# Patient Record
Sex: Male | Born: 1960 | Race: White | Hispanic: No | Marital: Married | Smoking: Never smoker
Health system: Southern US, Community
[De-identification: ages and names within clinical notes are randomized; demographics above are authoritative.]

## PROBLEM LIST (undated history)

## (undated) VITALS — BP 148/95 | HR 102 | Temp 97.8°F | Resp 20 | Ht 69.5 in | Wt 175.0 lb

## (undated) DIAGNOSIS — K859 Acute pancreatitis without necrosis or infection, unspecified: Secondary | ICD-10-CM

## (undated) DIAGNOSIS — K8020 Calculus of gallbladder without cholecystitis without obstruction: Secondary | ICD-10-CM

## (undated) DIAGNOSIS — D649 Anemia, unspecified: Secondary | ICD-10-CM

## (undated) DIAGNOSIS — I10 Essential (primary) hypertension: Secondary | ICD-10-CM

## (undated) DIAGNOSIS — F3181 Bipolar II disorder: Secondary | ICD-10-CM

## (undated) DIAGNOSIS — E785 Hyperlipidemia, unspecified: Secondary | ICD-10-CM

## (undated) DIAGNOSIS — Z860101 Personal history of adenomatous and serrated colon polyps: Secondary | ICD-10-CM

## (undated) DIAGNOSIS — F32A Depression, unspecified: Secondary | ICD-10-CM

## (undated) DIAGNOSIS — F329 Major depressive disorder, single episode, unspecified: Secondary | ICD-10-CM

## (undated) DIAGNOSIS — Z86010 Personal history of colonic polyps: Secondary | ICD-10-CM

## (undated) DIAGNOSIS — R569 Unspecified convulsions: Secondary | ICD-10-CM

## (undated) DIAGNOSIS — K219 Gastro-esophageal reflux disease without esophagitis: Secondary | ICD-10-CM

## (undated) DIAGNOSIS — J45909 Unspecified asthma, uncomplicated: Secondary | ICD-10-CM

## (undated) DIAGNOSIS — T7840XA Allergy, unspecified, initial encounter: Secondary | ICD-10-CM

## (undated) HISTORY — DX: Depression, unspecified: F32.A

## (undated) HISTORY — PX: COLONOSCOPY: SHX174

## (undated) HISTORY — PX: POLYPECTOMY: SHX149

## (undated) HISTORY — DX: Unspecified convulsions: R56.9

## (undated) HISTORY — DX: Hyperlipidemia, unspecified: E78.5

## (undated) HISTORY — DX: Unspecified asthma, uncomplicated: J45.909

## (undated) HISTORY — DX: Bipolar II disorder: F31.81

## (undated) HISTORY — DX: Acute pancreatitis without necrosis or infection, unspecified: K85.90

## (undated) HISTORY — DX: Gastro-esophageal reflux disease without esophagitis: K21.9

## (undated) HISTORY — DX: Allergy, unspecified, initial encounter: T78.40XA

## (undated) HISTORY — DX: Personal history of colonic polyps: Z86.010

## (undated) HISTORY — DX: Calculus of gallbladder without cholecystitis without obstruction: K80.20

## (undated) HISTORY — DX: Essential (primary) hypertension: I10

## (undated) HISTORY — DX: Anemia, unspecified: D64.9

## (undated) HISTORY — DX: Personal history of adenomatous and serrated colon polyps: Z86.0101

---
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
## 2019-07-15 ENCOUNTER — Ambulatory Visit (INDEPENDENT_AMBULATORY_CARE_PROVIDER_SITE_OTHER): Payer: Medicare Other | Admitting: Family Medicine

## 2019-07-15 ENCOUNTER — Encounter: Payer: Self-pay | Admitting: Family Medicine

## 2019-07-15 ENCOUNTER — Other Ambulatory Visit: Payer: Self-pay

## 2019-07-15 VITALS — BP 130/80 | HR 87 | Temp 97.6°F | Resp 16 | Ht 69.29 in | Wt 206.8 lb

## 2019-07-15 DIAGNOSIS — F3181 Bipolar II disorder: Secondary | ICD-10-CM

## 2019-07-15 DIAGNOSIS — N529 Male erectile dysfunction, unspecified: Secondary | ICD-10-CM

## 2019-07-15 DIAGNOSIS — E785 Hyperlipidemia, unspecified: Secondary | ICD-10-CM

## 2019-07-15 DIAGNOSIS — Z23 Encounter for immunization: Secondary | ICD-10-CM | POA: Diagnosis not present

## 2019-07-15 DIAGNOSIS — I10 Essential (primary) hypertension: Secondary | ICD-10-CM

## 2019-07-15 DIAGNOSIS — E291 Testicular hypofunction: Secondary | ICD-10-CM | POA: Diagnosis not present

## 2019-07-15 DIAGNOSIS — Z5181 Encounter for therapeutic drug level monitoring: Secondary | ICD-10-CM

## 2019-07-15 DIAGNOSIS — R7303 Prediabetes: Secondary | ICD-10-CM | POA: Diagnosis not present

## 2019-07-15 LAB — CBC WITH DIFFERENTIAL/PLATELET
Basophils Absolute: 0 10*3/uL (ref 0.0–0.1)
Basophils Relative: 0.6 % (ref 0.0–3.0)
Eosinophils Absolute: 0.1 10*3/uL (ref 0.0–0.7)
Eosinophils Relative: 1.3 % (ref 0.0–5.0)
HCT: 43.2 % (ref 39.0–52.0)
Hemoglobin: 14.8 g/dL (ref 13.0–17.0)
Lymphocytes Relative: 28 % (ref 12.0–46.0)
Lymphs Abs: 2.1 10*3/uL (ref 0.7–4.0)
MCHC: 34.3 g/dL (ref 30.0–36.0)
MCV: 89.4 fl (ref 78.0–100.0)
Monocytes Absolute: 0.5 10*3/uL (ref 0.1–1.0)
Monocytes Relative: 6.7 % (ref 3.0–12.0)
Neutro Abs: 4.6 10*3/uL (ref 1.4–7.7)
Neutrophils Relative %: 63.4 % (ref 43.0–77.0)
Platelets: 174 10*3/uL (ref 150.0–400.0)
RBC: 4.83 Mil/uL (ref 4.22–5.81)
RDW: 12.8 % (ref 11.5–15.5)
WBC: 7.3 10*3/uL (ref 4.0–10.5)

## 2019-07-15 LAB — COMPREHENSIVE METABOLIC PANEL
ALT: 25 U/L (ref 0–53)
AST: 17 U/L (ref 0–37)
Albumin: 4.7 g/dL (ref 3.5–5.2)
Alkaline Phosphatase: 36 U/L — ABNORMAL LOW (ref 39–117)
BUN: 18 mg/dL (ref 6–23)
CO2: 29 mEq/L (ref 19–32)
Calcium: 9.4 mg/dL (ref 8.4–10.5)
Chloride: 101 mEq/L (ref 96–112)
Creatinine, Ser: 0.96 mg/dL (ref 0.40–1.50)
GFR: 80.44 mL/min (ref >60.00)
Glucose, Bld: 90 mg/dL (ref 70–99)
Potassium: 4 mEq/L (ref 3.5–5.1)
Sodium: 140 mEq/L (ref 135–145)
Total Bilirubin: 0.7 mg/dL (ref 0.2–1.2)
Total Protein: 7.1 g/dL (ref 6.0–8.3)

## 2019-07-15 LAB — TESTOSTERONE: Testosterone: 495.04 ng/dL (ref 300.00–890.00)

## 2019-07-15 LAB — LIPID PANEL
Cholesterol: 266 mg/dL — ABNORMAL HIGH (ref 0–200)
HDL: 33.4 mg/dL — ABNORMAL LOW (ref >39.00)
NonHDL: 232.39
Total CHOL/HDL Ratio: 8
Triglycerides: 233 mg/dL — ABNORMAL HIGH (ref 0.0–149.0)
VLDL: 46.6 mg/dL — ABNORMAL HIGH (ref 0.0–40.0)

## 2019-07-15 LAB — LDL CHOLESTEROL, DIRECT: Direct LDL: 176 mg/dL

## 2019-07-15 LAB — HEMOGLOBIN A1C: Hgb A1c MFr Bld: 5.6 % (ref 4.6–6.5)

## 2019-07-15 MED ORDER — SILDENAFIL CITRATE 50 MG PO TABS: 50.0000 mg | ORAL_TABLET | Freq: Every day | ORAL | 3 refills | Status: DC | PRN

## 2019-07-15 NOTE — Progress Notes (Signed)
HPI:   [name] is a 58 y.o. male, who is here today to establish care.  Former PCP: Dr Chase Hunter Last preventive routine visit: 1-2 years.  Chronic medical problems: Asthma,low testosterone,bipolar disorder,allergic rhinitis,prediabetes, ED,HTN, and HLD among some. On Disability due to bipolar disorder. He follows with psychiatrist. He has a list of labs that his psychiatrist is requesting for him to have done.   HTN: Dx'ed "several years ago." He is on HCTZ 25 mg daily, Amlodipine 5 mg,Coreg 3.125 mg bid,and Lisinopril 40 mg daily. He does not check BP at home. Denies severe/frequent headache, visual changes, chest pain, dyspnea, palpitation, focal weakness, or edema.  Concerns today: Sildenafil refill. He has been on testosterone replacement for about 8 years. He is on Testosterone cypionate 100 mg weekly. Nocturia x 1 No side effects reported.  HLD: On non pharmacologic treatment.  [place] Component Name Value Ref Range  Triglycerides 252 (H) 1 - 149 mg/dL  Cholesterol 249 (H) 100 - 199 mg/dL  HDL 37 (L) 40 - 59 mg/dL  LDL Calculated 162 (H)  Comment: NHLBI Recommended Ranges, LDL Cholesterol, for Adults (20+yrs) (ATPIII), mg/dL Optimal       <100 60 - 99 mg/dL     Review of Systems  Constitutional: Negative for activity change, appetite change, fatigue, fever and unexpected weight change.  HENT: Negative for nosebleeds, sore throat and trouble swallowing.   Eyes: Negative for pain and redness.  Respiratory: Negative for cough and wheezing.   Gastrointestinal: Negative for abdominal pain, nausea and vomiting.  Genitourinary: Negative for decreased urine volume, dysuria and hematuria.  Musculoskeletal: Negative for gait problem and myalgias.  Skin: Negative for rash and wound.  Neurological: Negative for dizziness, syncope and numbness.  Psychiatric/Behavioral: Negative for confusion. The patient is nervous/anxious.   Rest  see pertinent positives and negatives per HPI.   Current Outpatient Medications on File Prior to Visit  Medication Sig Dispense Refill  . amLODipine (NORVASC) 5 MG tablet Take 5 mg by mouth daily.    . carvedilol (COREG) 3.125 MG tablet Take 3.125 mg by mouth daily. Take 1/2 tablet every day    . divalproex (DEPAKOTE) 500 MG DR tablet Take 500 mg by mouth 2 (two) times daily.    . hydrochlorothiazide (HYDRODIURIL) 25 MG tablet Take 25 mg by mouth daily.    . Levomefolate Calcium POWD by Does not apply route.    Marland Kitchen lisinopril (ZESTRIL) 40 MG tablet Take 40 mg by mouth daily.    . QUEtiapine (SEROQUEL) 200 MG tablet Take 200 mg by mouth at bedtime.    . Testosterone Cypionate 200 MG/ML SOLN Inject 200 mg as directed. Inject 0.5 mL sub-dermal every 7 days     No current facility-administered medications on file prior to visit.      Past Medical History:  Diagnosis Date  . Allergy   . Asthma   . Depression   . Hyperlipidemia   . Hypertension    No Known Allergies  History reviewed. No pertinent family history.  Social History   Socioeconomic History  . Marital status: Married    Spouse name: Not on file  . Number of children: Not on file  . Years of education: Not on file  . Highest education level: Not on file  Occupational History  . Not on file  Social Needs  . Financial resource strain: Not on file  . Food insecurity    Worry: Not on file  Inability: Not on file  . Transportation needs    Medical: Not on file    Non-medical: Not on file  Tobacco Use  . Smoking status: Never Smoker  . Smokeless tobacco: Never Used  Substance and Sexual Activity  . Alcohol use: Not on file  . Drug use: Not on file  . Sexual activity: Not on file  Lifestyle  . Physical activity    Days per week: Not on file    Minutes per session: Not on file  . Stress: Not on file  Relationships  . Social Herbalist on phone: Not on file    Gets together: Not on file    Attends  religious service: Not on file    Active member of club or organization: Not on file    Attends meetings of clubs or organizations: Not on file    Relationship status: Not on file  Other Topics Concern  . Not on file  Social History Narrative  . Not on file    Vitals:   07/15/19 0807  BP: 130/80  Pulse: 87  Resp: 16  Temp: 97.6 F (36.4 C)  SpO2: 96%    Body mass index is 30.28 kg/m.   Physical Exam  Nursing note and vitals reviewed. Constitutional: He is oriented to person, place, and time. He appears well-developed. No distress.  HENT:  Head: Normocephalic and atraumatic.  Mouth/Throat: Oropharynx is clear and moist and mucous membranes are normal.  Eyes: Pupils are equal, round, and reactive to light. Conjunctivae are normal.  Cardiovascular: Normal rate and regular rhythm.  No murmur heard. Pulses:      Dorsalis pedis pulses are 2+ on the right side and 2+ on the left side.  Respiratory: Effort normal and breath sounds normal. No respiratory distress.  GI: Soft. He exhibits no mass. There is no hepatomegaly. There is no abdominal tenderness.  Musculoskeletal:        General: No edema.  Lymphadenopathy:    He has no cervical adenopathy.  Neurological: He is alert and oriented to person, place, and time. He has normal strength. No cranial nerve deficit. Gait normal.  Skin: Skin is warm. No rash noted. No erythema.  Psychiatric: He has a normal mood and affect. Cognition and memory are normal.  Well groomed, good eye contact.    ASSESSMENT AND PLAN: Mr. Chase Hunter was seen today for establish care.  Diagnoses and all orders for this visit:  Orders Placed This Encounter  Procedures  . Flu Vaccine QUAD 36+ mos IM  . Testosterone  . Valproic Acid Level  . CMP  . Hemoglobin A1c  . Lipid panel  . CBC with Differential/Platelet  . LDL cholesterol, direct   Lab Results  Component Value Date   HGBA1C 5.6 07/15/2019   Lab Results  Component Value Date   WBC  7.3 07/15/2019   HGB 14.8 07/15/2019   HCT 43.2 07/15/2019   MCV 89.4 07/15/2019   PLT 174.0 07/15/2019   Lab Results  Component Value Date   ALT 25 07/15/2019   AST 17 07/15/2019   ALKPHOS 36 (L) 07/15/2019   BILITOT 0.7 07/15/2019   Lab Results  Component Value Date   CREATININE 0.96 07/15/2019   BUN 18 07/15/2019   NA 140 07/15/2019   K 4.0 07/15/2019   CL 101 07/15/2019   CO2 29 07/15/2019   Lab Results  Component Value Date   CHOL 266 (H) 07/15/2019   HDL 33.40 (L) 07/15/2019  LDLDIRECT 176.0 07/15/2019   TRIG 233.0 (H) 07/15/2019   CHOLHDL 8 07/15/2019    Hypogonadism in male Side effects of testosterone discussed. No changes in current management. Further recommendations will be given according to lab results.  Hyperlipidemia, unspecified hyperlipidemia type Continue non pharmacologic treatment. Further recommendations will be given according to FLP results.  The 10-year ASCVD risk score Mikey Bussing DC Brooke Bonito., et al., 2013) is: 15.6%   Values used to calculate the score:     Age: 14 years     Sex: Male     Is Non-Hispanic African American: No     Diabetic: No     Tobacco smoker: No     Systolic Blood Pressure: [id number]     Is BP treated: Yes     HDL Cholesterol: 33.4 mg/dL     Total Cholesterol: 266 mg/dL  Hypertension, essential, benign Adequately controlled. No changes in current management. Low diet recommended. Eye exam recommended annually. F/U in 6 months, before if needed.  -     CMP  Bipolar II disorder ([place]) Continue following with psychiatrist.  Encounter for medication monitoring -     Valproic Acid Level  Prediabetes Healthy life style for primary prevention recommended.  Need for immunization against influenza -     Flu Vaccine QUAD 36+ mos IM  Erectile dysfunction, unspecified erectile dysfunction type Some side effects of Sildenafil discussed. No changes in current management.  -     sildenafil (VIAGRA) 50 MG tablet; Take 1  tablet (50 mg total) by mouth daily as needed for erectile dysfunction. 1/2 to 1 tablet daily as needed     Return in about 5 months (around 12/13/2019).     Daton Szilagyi G. Martinique, MD  [facility]. [place] office.

## 2019-07-15 NOTE — Patient Instructions (Signed)
A few things to remember from today's visit:   Hypogonadism in male - Plan: Testosterone, CMP, CBC with Differential/Platelet  Hyperlipidemia, unspecified hyperlipidemia type - Plan: CMP, Lipid panel  Hypertension, essential, benign - Plan: CMP  Bipolar II disorder ([place]) - Plan: Valproic Acid Level  Encounter for medication monitoring - Plan: Valproic Acid Level  Prediabetes - Plan: Hemoglobin A1c  No changes today.  Please be sure medication list is accurate. If a new problem present, please set up appointment sooner than planned today.

## 2019-07-16 LAB — VALPROIC ACID LEVEL: Valproic Acid Lvl: 33.1 mg/L — ABNORMAL LOW (ref 50.0–100.0)

## 2019-07-17 DIAGNOSIS — I10 Essential (primary) hypertension: Secondary | ICD-10-CM | POA: Insufficient documentation

## 2019-07-17 DIAGNOSIS — F3181 Bipolar II disorder: Secondary | ICD-10-CM | POA: Insufficient documentation

## 2019-07-17 DIAGNOSIS — E291 Testicular hypofunction: Secondary | ICD-10-CM | POA: Insufficient documentation

## 2019-07-18 ENCOUNTER — Other Ambulatory Visit: Payer: Self-pay

## 2019-07-18 ENCOUNTER — Encounter: Payer: Self-pay | Admitting: Family Medicine

## 2019-07-18 MED ORDER — ATORVASTATIN CALCIUM 20 MG PO TABS: 20.0000 mg | ORAL_TABLET | Freq: Every day | ORAL | 3 refills | Status: DC

## 2019-07-20 NOTE — Telephone Encounter (Signed)
Spoke with the patient. He stated that he has printed his results from [place] himself. Nothing further needed.

## 2019-08-12 ENCOUNTER — Encounter: Payer: Self-pay | Admitting: Family Medicine

## 2019-10-18 ENCOUNTER — Other Ambulatory Visit: Payer: Self-pay

## 2019-10-18 ENCOUNTER — Encounter: Payer: Self-pay | Admitting: Family Medicine

## 2019-10-18 DIAGNOSIS — I10 Essential (primary) hypertension: Secondary | ICD-10-CM

## 2019-10-18 MED ORDER — CARVEDILOL 3.125 MG PO TABS: ORAL_TABLET | ORAL | 1 refills | Status: DC

## 2019-10-18 MED ORDER — "INSULIN SYRINGE 29G X 1/2"" 1 ML MISC": 2 refills | Status: DC

## 2019-10-18 MED ORDER — AMLODIPINE BESYLATE 5 MG PO TABS: 5.0000 mg | ORAL_TABLET | Freq: Every day | ORAL | 1 refills | Status: DC

## 2019-10-18 MED ORDER — HYDROCHLOROTHIAZIDE 25 MG PO TABS: 25.0000 mg | ORAL_TABLET | Freq: Every day | ORAL | 1 refills | Status: DC

## 2019-10-18 MED ORDER — LISINOPRIL 40 MG PO TABS: 40.0000 mg | ORAL_TABLET | Freq: Every day | ORAL | 1 refills | Status: DC

## 2019-10-18 MED ORDER — SILDENAFIL CITRATE 50 MG PO TABS: 50.0000 mg | ORAL_TABLET | Freq: Every day | ORAL | 3 refills | Status: DC | PRN

## 2019-10-25 ENCOUNTER — Encounter: Payer: Self-pay | Admitting: Family Medicine

## 2019-10-25 MED ORDER — TESTOSTERONE CYPIONATE 200 MG/ML IJ SOLN: 100.0000 mg | INTRAMUSCULAR | 0 refills | Status: DC

## 2019-10-28 NOTE — Telephone Encounter (Signed)
Pt needs Rx to [facility] to [facility] [address], it somehow got sent to [facility].

## 2019-10-31 ENCOUNTER — Other Ambulatory Visit: Payer: Self-pay | Admitting: Family Medicine

## 2019-10-31 MED ORDER — TESTOSTERONE CYPIONATE 200 MG/ML IJ SOLN: 100.0000 mg | INTRAMUSCULAR | 0 refills | Status: DC

## 2019-10-31 NOTE — Telephone Encounter (Signed)
Prescription resent. Can you please call [facility] to cancel prescription for testosterone. Thanks, BJ

## 2019-11-10 ENCOUNTER — Encounter: Payer: Self-pay | Admitting: Family Medicine

## 2020-01-02 ENCOUNTER — Other Ambulatory Visit: Payer: Self-pay | Admitting: Family Medicine

## 2020-01-02 NOTE — Telephone Encounter (Signed)
Spoke to pharmacist and he stated that the bottles that he is getting is only one time use and that is the reason it is only a 35 day supply.

## 2020-01-02 NOTE — Telephone Encounter (Signed)
Can you please verify this information with his pharmacy. He is on Testosterone 100 mg weekly, 0.5 ml. 30 days supply will be 2 ml (0.5 ml weekly). He picked up Rx for testosterone 5 ml (10 doses) on 11/22/19 and according to [place] controlled substance pharmacy is reporting this amount as 35 days supply.  Thanks, BJ

## 2020-04-20 ENCOUNTER — Other Ambulatory Visit: Payer: Self-pay | Admitting: Family Medicine

## 2020-04-20 DIAGNOSIS — I10 Essential (primary) hypertension: Secondary | ICD-10-CM

## 2020-04-25 DIAGNOSIS — K859 Acute pancreatitis without necrosis or infection, unspecified: Secondary | ICD-10-CM | POA: Insufficient documentation

## 2020-05-02 ENCOUNTER — Other Ambulatory Visit: Payer: Self-pay

## 2020-05-02 ENCOUNTER — Encounter (HOSPITAL_COMMUNITY): Payer: Self-pay | Admitting: Emergency Medicine

## 2020-05-02 ENCOUNTER — Emergency Department (HOSPITAL_COMMUNITY)
Admission: EM | Admit: 2020-05-02 | Discharge: 2020-05-02 | Disposition: A | Discharge status: Home / Self Care | Payer: Medicare Other | Attending: Emergency Medicine | Admitting: Emergency Medicine

## 2020-05-02 DIAGNOSIS — R109 Unspecified abdominal pain: Secondary | ICD-10-CM | POA: Insufficient documentation

## 2020-05-02 DIAGNOSIS — Z5321 Procedure and treatment not carried out due to patient leaving prior to being seen by health care provider: Secondary | ICD-10-CM | POA: Insufficient documentation

## 2020-05-02 LAB — URINALYSIS, ROUTINE W REFLEX MICROSCOPIC
Bacteria, UA: NONE SEEN
Bilirubin Urine: NEGATIVE
Glucose, UA: >=500 mg/dL — AB
Hgb urine dipstick: NEGATIVE
Ketones, ur: 5 mg/dL — AB
Leukocytes,Ua: NEGATIVE
Nitrite: NEGATIVE
Protein, ur: NEGATIVE mg/dL
Specific Gravity, Urine: 1.02 (ref 1.005–1.030)
pH: 5 (ref 5.0–8.0)

## 2020-05-02 LAB — COMPREHENSIVE METABOLIC PANEL
ALT: 33 U/L (ref 0–44)
AST: 21 U/L (ref 15–41)
Albumin: 4.1 g/dL (ref 3.5–5.0)
Alkaline Phosphatase: 39 U/L (ref 38–126)
Anion gap: 11 (ref 5–15)
BUN: 18 mg/dL (ref 6–20)
CO2: 23 mmol/L (ref 22–32)
Calcium: 8.8 mg/dL — ABNORMAL LOW (ref 8.9–10.3)
Chloride: 97 mmol/L — ABNORMAL LOW (ref 98–111)
Creatinine, Ser: 0.78 mg/dL (ref 0.61–1.24)
GFR calc Af Amer: >60 mL/min (ref >60)
GFR calc non Af Amer: >60 mL/min (ref >60)
Glucose, Bld: 128 mg/dL — ABNORMAL HIGH (ref 70–99)
Potassium: 3.6 mmol/L (ref 3.5–5.1)
Sodium: 131 mmol/L — ABNORMAL LOW (ref 135–145)
Total Bilirubin: 0.5 mg/dL (ref 0.3–1.2)
Total Protein: 7.3 g/dL (ref 6.5–8.1)

## 2020-05-02 LAB — CBC
HCT: 37.7 % — ABNORMAL LOW (ref 39.0–52.0)
Hemoglobin: 13 g/dL (ref 13.0–17.0)
MCH: 31.5 pg (ref 26.0–34.0)
MCHC: 34.5 g/dL (ref 30.0–36.0)
MCV: 91.3 fL (ref 80.0–100.0)
Platelets: 167 10*3/uL (ref 150–400)
RBC: 4.13 MIL/uL — ABNORMAL LOW (ref 4.22–5.81)
RDW: 13.2 % (ref 11.5–15.5)
WBC: 14.9 10*3/uL — ABNORMAL HIGH (ref 4.0–10.5)
nRBC: 0 % (ref 0.0–0.2)

## 2020-05-02 LAB — LIPASE, BLOOD: Lipase: 264 U/L — ABNORMAL HIGH (ref 11–51)

## 2020-05-02 NOTE — ED Notes (Signed)
Requested urine from patient. 

## 2020-05-08 ENCOUNTER — Telehealth: Payer: Self-pay | Admitting: Family Medicine

## 2020-05-08 DIAGNOSIS — K859 Acute pancreatitis without necrosis or infection, unspecified: Secondary | ICD-10-CM

## 2020-05-08 NOTE — Telephone Encounter (Signed)
Wife called to say the hospital took pt off 2 of his blood pressure medications  amLODipine (NORVASC) 5 MG tablet   hydrochlorothiazide (HYDRODIURIL) 25 MG tablet   And they want to know if he should start taking them again  09/06....BP..131/89 Pulse 67  09/7.... BP..133/95 pulse 75  Please advise

## 2020-05-08 NOTE — Telephone Encounter (Signed)
Patient needs a referral to a gastrologist. He was seen in the ED for Pancreatitis and wants to be seen by someone in our network.  Please advise

## 2020-05-08 NOTE — Telephone Encounter (Signed)
Please advise. Pt has a f/u appt on 9/13.

## 2020-05-09 NOTE — Telephone Encounter (Signed)
It is ok to place referral now or we can discuss it during f/u visit. Thanks, BJ

## 2020-05-09 NOTE — Telephone Encounter (Signed)
I spoke with patient. I made him aware that the referral has been placed to [facility]. Pt has been monitoring his BP at home since the hospital d/c'd his blood pressure medications. Pt will continue monitoring it and f/u with [place] Monday at 7:30am in office.

## 2020-05-09 NOTE — Addendum Note (Signed)
Addended by: Nathanial Millman E on: 05/09/2020 12:18 PM   Modules accepted: Orders

## 2020-05-10 ENCOUNTER — Encounter: Payer: Self-pay | Admitting: Gastroenterology

## 2020-05-14 ENCOUNTER — Ambulatory Visit (INDEPENDENT_AMBULATORY_CARE_PROVIDER_SITE_OTHER): Payer: Medicare Other | Admitting: Family Medicine

## 2020-05-14 ENCOUNTER — Encounter: Payer: Self-pay | Admitting: Family Medicine

## 2020-05-14 ENCOUNTER — Other Ambulatory Visit: Payer: Self-pay

## 2020-05-14 VITALS — BP 122/82 | HR 91 | Ht 70.0 in | Wt 189.1 lb

## 2020-05-14 DIAGNOSIS — E785 Hyperlipidemia, unspecified: Secondary | ICD-10-CM | POA: Diagnosis not present

## 2020-05-14 DIAGNOSIS — K859 Acute pancreatitis without necrosis or infection, unspecified: Secondary | ICD-10-CM

## 2020-05-14 DIAGNOSIS — I10 Essential (primary) hypertension: Secondary | ICD-10-CM | POA: Diagnosis not present

## 2020-05-14 DIAGNOSIS — F3181 Bipolar II disorder: Secondary | ICD-10-CM | POA: Diagnosis not present

## 2020-05-14 LAB — BASIC METABOLIC PANEL WITH GFR
BUN: 17 mg/dL (ref 7–25)
CO2: 23 mmol/L (ref 20–32)
Calcium: 8.6 mg/dL (ref 8.6–10.3)
Chloride: 108 mmol/L (ref 98–110)
Creat: 0.96 mg/dL (ref 0.70–1.33)
GFR, Est African American: 101 mL/min/{1.73_m2} (ref >=60)
GFR, Est Non African American: 87 mL/min/{1.73_m2} (ref >=60)
Glucose, Bld: 98 mg/dL (ref 65–99)
Potassium: 4.2 mmol/L (ref 3.5–5.3)
Sodium: 139 mmol/L (ref 135–146)

## 2020-05-14 LAB — CBC
HCT: 39 % (ref 38.5–50.0)
Hemoglobin: 12.4 g/dL — ABNORMAL LOW (ref 13.2–17.1)
MCH: 29.2 pg (ref 27.0–33.0)
MCHC: 31.8 g/dL — ABNORMAL LOW (ref 32.0–36.0)
MCV: 92 fL (ref 80.0–100.0)
MPV: 10 fL (ref 7.5–12.5)
Platelets: 315 10*3/uL (ref 140–400)
RBC: 4.24 10*6/uL (ref 4.20–5.80)
RDW: 13.1 % (ref 11.0–15.0)
WBC: 6.2 10*3/uL (ref 3.8–10.8)

## 2020-05-14 LAB — LIPASE: Lipase: 154 U/L — ABNORMAL HIGH (ref 7–60)

## 2020-05-14 LAB — LIPID PANEL
Cholesterol: 213 mg/dL — ABNORMAL HIGH (ref <200)
HDL: 37 mg/dL — ABNORMAL LOW (ref >=40)
LDL Cholesterol (Calc): 145 mg/dL (calc) — ABNORMAL HIGH
Non-HDL Cholesterol (Calc): 176 mg/dL (calc) — ABNORMAL HIGH (ref <130)
Total CHOL/HDL Ratio: 5.8 (calc) — ABNORMAL HIGH (ref <5.0)
Triglycerides: 177 mg/dL — ABNORMAL HIGH (ref <150)

## 2020-05-14 NOTE — Progress Notes (Signed)
HPI: [name] is a 59 y.o. male, who is here today to follow on recent hospitalization. He was admitted on 05/02/20 because 3 days of severe epigastric pain and discharged home on 05/05/20 Appetite back to his baseline and fatigue improving.  Tolerating po well. Not longer having N/V. Slight minimal pain with prolonged sitting and when crossing arms on abdomen. Occasionally he has RUQ discomfort. Negative for fever,chills,CP,palpitations, or decreased urine output.  Acute pancreatitis, idiopathic. No prior hx. Negative for high alcohol intake or new medications.  Bipolar disorder: He is following closely with psychiatrist and decided to hold on starting  new medication until he sees GI. The most improvement noted after discontinuing Depakote.  Lab Results  Component Value Date   WBC 14.9 (H) 05/02/2020   HGB 13.0 05/02/2020   HCT 37.7 (L) 05/02/2020   MCV 91.3 05/02/2020   PLT 167 05/02/2020   Lab Results  Component Value Date   LIPASE 264 (H) 05/02/2020    HTN: 2 antihypertensive meds were discontinued: Amlodipine and HCTZ. BP readings at home:110's/70-80's, sone DBP's in the 90's. Negative for severe/frequent headache, visual changes,dyspnea, claudication, focal weakness, or edema. He is on Lisinopril 40 mg daily and carvedilol 12.5 mg bid.  HLD: Atorvastatin and other statins taken in the past caused confusion, "memory loss" and forgetfulness.  Lab Results  Component Value Date   CHOL 266 (H) 07/15/2019   HDL 33.40 (L) 07/15/2019   LDLDIRECT 176.0 07/15/2019   TRIG 233.0 (H) 07/15/2019   CHOLHDL 8 07/15/2019    Review of Systems  Constitutional: Positive for fatigue. Negative for activity change.  HENT: Negative for mouth sores, nosebleeds, sore throat and trouble swallowing.   Eyes: Negative for redness and visual disturbance.  Respiratory: Negative for cough and wheezing.   Gastrointestinal: Negative for constipation and diarrhea.   Genitourinary: Negative for dysuria and hematuria.  Musculoskeletal: Negative for gait problem and myalgias.  Neurological: Negative for syncope and weakness.  Psychiatric/Behavioral: Negative for confusion and hallucinations.  Rest see pertinent positives and negatives per HPI.  Current Outpatient Medications on File Prior to Visit  Medication Sig Dispense Refill  . albuterol (PROVENTIL) (2.5 MG/3ML) 0.083% nebulizer solution Take 2.5 mg by nebulization every 6 (six) hours as needed for wheezing or shortness of breath.    Marland Kitchen albuterol (VENTOLIN HFA) 108 (90 Base) MCG/ACT inhaler Inhale 2 puffs into the lungs every 6 (six) hours as needed for wheezing or shortness of breath.    . carvedilol (COREG) 3.125 MG tablet Take 1/2 tablet every day.    . Cholecalciferol (VITAMIN D3) 50 MCG (2000 UT) capsule Take 2,000 Units by mouth daily.    . fluticasone (FLONASE) 50 MCG/ACT nasal spray Place 1 spray into both nostrils daily.    . Fluticasone-Salmeterol (ADVAIR) 250-50 MCG/DOSE AEPB Inhale 1 puff into the lungs 2 (two) times daily.    . INSULIN SYRINGE 1CC/29G (EXEL COMFORT POINT INSULIN SYR) 29G X 1/2" 1 ML MISC Inject testosterone 0.5 ml weekly 100 each 2  . lisinopril (ZESTRIL) 40 MG tablet TAKE 1 TABLET BY MOUTH EVERY DAY 90 tablet 1  . Omega-3 Fatty Acids (FISH OIL) 1200 MG CAPS Take 1 capsule by mouth daily.    . QUEtiapine (SEROQUEL) 300 MG tablet Take 300 mg by mouth at bedtime.    . sildenafil (VIAGRA) 50 MG tablet Take 1 tablet (50 mg total) by mouth daily as needed for erectile dysfunction. 1/2 to 1 tablet daily as needed 15 tablet  3  . testosterone cypionate (DEPOTESTOSTERONE CYPIONATE) 200 MG/ML injection INJECT 100 MG (0.5 ML) AS DIRECTED EVERY 7 (SEVEN) DAYS. 2 mL 0   No current facility-administered medications on file prior to visit.     Past Medical History:  Diagnosis Date  . Allergy   . Asthma   . Bipolar II disorder ([place])   . Depression   . GERD (gastroesophageal reflux  disease)   . Hyperlipidemia   . Hypertension    Allergies  Allergen Reactions  . Cephalexin Hives  . Atorvastatin Rash    Other reaction(s): Other (See Comments) Memory issues that have not resolved Memory issues that have not resolved     Social History   Socioeconomic History  . Marital status: Married    Spouse name: Not on file  . Number of children: Not on file  . Years of education: Not on file  . Highest education level: Not on file  Occupational History  . Not on file  Tobacco Use  . Smoking status: Never Smoker  . Smokeless tobacco: Never Used  Substance and Sexual Activity  . Alcohol use: Not on file  . Drug use: Not on file  . Sexual activity: Not on file  Other Topics Concern  . Not on file  Social History Narrative  . Not on file   Social Determinants of Health   Financial Resource Strain:   . Difficulty of Paying Living Expenses: Not on file  Food Insecurity:   . Worried About Charity fundraiser in the Last Year: Not on file  . Ran Out of Food in the Last Year: Not on file  Transportation Needs:   . Lack of Transportation (Medical): Not on file  . Lack of Transportation (Non-Medical): Not on file  Physical Activity:   . Days of Exercise per Week: Not on file  . Minutes of Exercise per Session: Not on file  Stress:   . Feeling of Stress : Not on file  Social Connections:   . Frequency of Communication with Friends and Family: Not on file  . Frequency of Social Gatherings with Friends and Family: Not on file  . Attends Religious Services: Not on file  . Active Member of Clubs or Organizations: Not on file  . Attends Archivist Meetings: Not on file  . Marital Status: Not on file    Vitals:   05/14/20 0719  BP: 122/82  Pulse: 91  SpO2: 97%   Body mass index is 27.14 kg/m.  Physical Exam Vitals and nursing note reviewed.  Constitutional:      General: He is not in acute distress.    Appearance: He is well-developed.  HENT:      Head: Normocephalic and atraumatic.     Mouth/Throat:     Mouth: Mucous membranes are moist.     Pharynx: Oropharynx is clear.  Eyes:     Conjunctiva/sclera: Conjunctivae normal.  Cardiovascular:     Rate and Rhythm: Normal rate and regular rhythm.     Pulses:          Dorsalis pedis pulses are 2+ on the right side and 2+ on the left side.     Heart sounds: No murmur heard.   Pulmonary:     Effort: Pulmonary effort is normal. No respiratory distress.     Breath sounds: Normal breath sounds.  Abdominal:     Palpations: Abdomen is soft. There is no hepatomegaly or mass.     Tenderness: There is  no abdominal tenderness.  Lymphadenopathy:     Cervical: No cervical adenopathy.  Skin:    General: Skin is warm.     Findings: No erythema or rash.  Neurological:     General: No focal deficit present.     Mental Status: He is alert and oriented to person, place, and time.     Cranial Nerves: No cranial nerve deficit.     Gait: Gait normal.  Psychiatric:        Mood and Affect: Mood and affect normal.     Comments: Well groomed, good eye contact.    ASSESSMENT AND PLAN:  Chase Hunter was seen today for hospitalization follow-up. Diagnoses and all orders for this visit:  Orders Placed This Encounter  Procedures  . CBC  . BASIC METABOLIC PANEL WITH GFR  . Lipase  . Lipid panel   Lab Results  Component Value Date   WBC 6.2 05/14/2020   HGB 12.4 (L) 05/14/2020   HCT 39.0 05/14/2020   MCV 92.0 05/14/2020   PLT 315 05/14/2020    Lab Results  Component Value Date   CHOL 213 (H) 05/14/2020   HDL 37 (L) 05/14/2020   LDLCALC 145 (H) 05/14/2020   LDLDIRECT 176.0 07/15/2019   TRIG 177 (H) 05/14/2020   CHOLHDL 5.8 (H) 05/14/2020   Lab Results  Component Value Date   CREATININE 0.96 05/14/2020   BUN 17 05/14/2020   NA 139 05/14/2020   K 4.2 05/14/2020   CL 108 05/14/2020   CO2 23 05/14/2020   Lab Results  Component Value Date   LIPASE 154 (H) 05/14/2020     Hypertension, essential, benign BP otherwise adequately controlled. No changes in current management.'Continue low salt diet and closely monitor BP at home. Goal BP 130/80. If needed HCTZ could be resume.   Hyperlipidemia He has not tolerated statins in the past, so continue low fat diet. Further recommendations according to lipid panel results.   Bipolar II disorder ([place]) Symptoms seem to be well controlled with current management. He is following closely with his psychiatrist.  Acute pancreatitis without necrosis or infection, unspecified Clinically improved. We discussed possible etiologies. He has an appointment with GI in the next few days. Instructed about warning signs.  Return in about 6 months (around 11/11/2020) for Needs AWV. Marland Kitchen   Chase Croft G. Martinique, MD  [facility]. [place] office.  A few things to remember from today's visit:  No changes today. Continue monitoring blood pressure. Keep appt with gastro. Continue following with your psychiatrist.  If you need refills please call your pharmacy. Do not use My Chart to request refills or for acute issues that need immediate attention.    Please be sure medication list is accurate. If a new problem present, please set up appointment sooner than planned today.

## 2020-05-14 NOTE — Assessment & Plan Note (Signed)
Clinically improved. We discussed possible etiologies. He has an appointment with GI in the next few days. Instructed about warning signs.

## 2020-05-14 NOTE — Patient Instructions (Addendum)
A few things to remember from today's visit:  No changes today. Continue monitoring blood pressure. Keep appt with gastro. Continue following with your psychiatrist.  If you need refills please call your pharmacy. Do not use My Chart to request refills or for acute issues that need immediate attention.    Please be sure medication list is accurate. If a new problem present, please set up appointment sooner than planned today.

## 2020-05-14 NOTE — Assessment & Plan Note (Addendum)
He has not tolerated statins in the past, so continue low fat diet. Further recommendations according to lipid panel results.

## 2020-05-14 NOTE — Assessment & Plan Note (Signed)
Symptoms seem to be well controlled with current management. He is following closely with his psychiatrist.

## 2020-05-14 NOTE — Assessment & Plan Note (Addendum)
BP otherwise adequately controlled. No changes in current management.'Continue low salt diet and closely monitor BP at home. Goal BP 130/80. If needed HCTZ could be resume.

## 2020-05-18 ENCOUNTER — Ambulatory Visit (INDEPENDENT_AMBULATORY_CARE_PROVIDER_SITE_OTHER): Payer: Medicare Other | Admitting: Gastroenterology

## 2020-05-18 ENCOUNTER — Encounter: Payer: Self-pay | Admitting: Gastroenterology

## 2020-05-18 VITALS — BP 110/78 | HR 100 | Ht 70.0 in | Wt 190.0 lb

## 2020-05-18 DIAGNOSIS — R932 Abnormal findings on diagnostic imaging of liver and biliary tract: Secondary | ICD-10-CM

## 2020-05-18 DIAGNOSIS — K853 Drug induced acute pancreatitis without necrosis or infection: Secondary | ICD-10-CM

## 2020-05-18 NOTE — Patient Instructions (Addendum)
Discontinue Depakote.  Low fat diet.  You have been scheduled for an MRI/MRCP at [facility] on 07/18/2020. Your appointment time is 10:00am. Please arrive 15 minutes prior to your appointment time for registration purposes. Please make certain not to have anything to eat or drink 4 hours prior to your test. In addition, if you have any metal in your body, have a pacemaker or defibrillator, please be sure to let your ordering physician know. This test typically takes 45 minutes to 1 hour to complete. Should you need to reschedule, please call [phone] to do so.   I appreciate the opportunity to care for you. [place] Cellar, MD

## 2020-05-18 NOTE — Progress Notes (Signed)
HPI :  59 y/o male with a history of bipolar / depression, HTN, pancreatitis, referred here by Dr. Betty Martinique for pancreatitis.   At the end of August he states he developed a few days worth of epigastric pain which progressed over time.  States it was severe epigastric pain.  He really did not have much nausea vomiting, no fevers.  The pain lasted 3 to 4 days, he actually went to the [facility] ED but the wait was so long he went home.  He then had worsening pain and called an ambulance, they subsequently took him to [facility]  He was admitted there from September 1 through September 4.  Work-up as outlined below.  He had pancreatitis based on elevated lipase and CT scan showing focal pancreatitis of the tail.  He had an ultrasound showing a suspected 7 mm gallbladder polyp versus stone.  His LFTs were normal at the time of admission.  Triglycerides were normal.  IgG4 and ANA was negative.  He denies any alcohol use at all.  He has a history of bipolar disorder and had been on Depakote for a while, this had actually been increased 2 months before this episode of pancreatitis.  He denied any other medication changes prior to this episode.  He stopped the Depakote and has not resumed since then.  He denies any NSAIDs or other over-the-counter medications.  He denies any routine postprandial pain in his upper abdomen or right upper quadrant.  He has never had problems with gallstones in the past.  He denies any family history of pancreatic cancer or family history of pancreatitis.  Since his discharge he does appear to be getting better.  He has some occasional upper abdominal discomfort rated 1-2 out of 10, but generally improved.  He still has some fatigue and needs to lie down periodically for his symptoms.  He previously has had 2 colonoscopies in the past, the last was about 2 to 3 years ago.  Done at [facility] by Dr. Sabra Heck.  He is also had an endoscopy about 6 years ago by the same provider.  He  has questions about long-term plan in regards to this, will kind of diet he should be on, etc.  Trig 92 and then 177,  Lipase 264 LFTs were NORMAL WBC 14.9 IgG4 22 ANA negative   [place] 05/03/20 - IMPRESSION:  Increase obscured limiting assessment. 7 mm echogenic eccentric focus without significant shadowing. Negative sonographic Murphy sign. Mild wall thickening involving the mid aspect of the gallbladder measuring upwards of 8 mm. No intramural edema or pericholecystic fluid.  Biliary: CBD not visualized . Likely gallbladder polyp. Cannot excluded adherent stone. 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  Trace ascites.  Mild asymmetric prominence of the left kidney as compared to the right. This could relate to underlying vascular disease.     CT abdomen / pelvis 05/02/20 -  CT ABDOMEN: Pancreatic tail is edematous with peripancreatic stranding compatible with pancreatitis. No focal peripancreatic fluid collection. The lung bases are remarkable for dependent bilateral lower lobe subsegmental atelectasis. Normal heart size. Normal liver, gallbladder, biliary tree, spleen, adrenal glands, and kidneys. Retroaortic left renal vein. Aortoiliac atherosclerosis without aneurysm. No ascites or adenopathy.   CT PELVIS: The prostate is enlarged with bladder wall thickening. No evidence for bowel obstruction, diverticulitis, or appendicitis. No pelvic mass, adenopathy, or fluid collection. Multilevel lumbar DDD.        Past Medical History:  Diagnosis Date  . Allergy   .  Anemia   . Asthma   . Bipolar II disorder ([place])   . Depression   . Gallstones   . GERD (gastroesophageal reflux disease)   . Hx of adenomatous colonic polyps   . Hyperlipidemia   . Hypertension   . Pancreatitis      History reviewed. No pertinent surgical history. Family History  Problem Relation Age of Onset  . Atrial fibrillation Mother   . Prostate cancer Father   . Brain cancer Father    Social History    Tobacco Use  . Smoking status: Never Smoker  . Smokeless tobacco: Never Used  Substance Use Topics  . Alcohol use: Not on file  . Drug use: Never   Current Outpatient Medications  Medication Sig Dispense Refill  . albuterol (PROVENTIL) (2.5 MG/3ML) 0.083% nebulizer solution Take 2.5 mg by nebulization every 6 (six) hours as needed for wheezing or shortness of breath.    Marland Kitchen albuterol (VENTOLIN HFA) 108 (90 Base) MCG/ACT inhaler Inhale 2 puffs into the lungs every 6 (six) hours as needed for wheezing or shortness of breath.    . carvedilol (COREG) 3.125 MG tablet Take 1/2 tablet every day.    . Cholecalciferol (VITAMIN D3) 50 MCG (2000 UT) capsule Take 2,000 Units by mouth daily.    . fluticasone (FLONASE) 50 MCG/ACT nasal spray Place 1 spray into both nostrils daily.    . Fluticasone-Salmeterol (ADVAIR) 250-50 MCG/DOSE AEPB Inhale 1 puff into the lungs 2 (two) times daily.    . INSULIN SYRINGE 1CC/29G (EXEL COMFORT POINT INSULIN SYR) 29G X 1/2" 1 ML MISC Inject testosterone 0.5 ml weekly 100 each 2  . lisinopril (ZESTRIL) 40 MG tablet TAKE 1 TABLET BY MOUTH EVERY DAY 90 tablet 1  . Omega-3 Fatty Acids (FISH OIL) 1200 MG CAPS Take 1 capsule by mouth daily.    . QUEtiapine (SEROQUEL) 300 MG tablet Take 300 mg by mouth at bedtime.    . sildenafil (VIAGRA) 50 MG tablet Take 1 tablet (50 mg total) by mouth daily as needed for erectile dysfunction. 1/2 to 1 tablet daily as needed 15 tablet 3  . testosterone cypionate (DEPOTESTOSTERONE CYPIONATE) 200 MG/ML injection INJECT 100 MG (0.5 ML) AS DIRECTED EVERY 7 (SEVEN) DAYS. 2 mL 0   No current facility-administered medications for this visit.   Allergies  Allergen Reactions  . Cephalexin Hives  . Lipitor [Atorvastatin] Rash    Other reaction(s): Other (See Comments) Memory issues that have not resolved     Review of Systems: All systems reviewed and negative except where noted in HPI.   Lab Results  Component Value Date   WBC 6.2  05/14/2020   HGB 12.4 (L) 05/14/2020   HCT 39.0 05/14/2020   MCV 92.0 05/14/2020   PLT 315 05/14/2020    Lab Results  Component Value Date   CREATININE 0.96 05/14/2020   BUN 17 05/14/2020   NA 139 05/14/2020   K 4.2 05/14/2020   CL 108 05/14/2020   CO2 23 05/14/2020   Lab Results  Component Value Date   ALT 33 05/02/2020   AST 21 05/02/2020   ALKPHOS 39 05/02/2020   BILITOT 0.5 05/02/2020     Physical Exam: BP 110/78 (BP Location: Left Arm, Patient Position: Sitting, Cuff Size: Normal)   Pulse 100   Ht 5\' 10"  (1.778 m)   Wt 190 lb (86.2 kg)   BMI 27.26 kg/m  Constitutional: Pleasant,well-developed, male in no acute distress. HEENT: Normocephalic and atraumatic. Conjunctivae are normal. No  scleral icterus. Neck supple.  Cardiovascular: Normal rate, regular rhythm.  Pulmonary/chest: Effort normal and breath sounds normal. No wheezing, rales or rhonchi. Abdominal: Soft, nondistended, mild tenderness to epigastric area.There are no masses palpable.  Extremities: no edema Lymphadenopathy: No cervical adenopathy noted. Neurological: Alert and oriented to person place and time. Skin: Skin is warm and dry. No rashes noted. Psychiatric: Normal mood and affect. Behavior is normal.   ASSESSMENT AND PLAN: 59 year old male here for new patient assessment of the following:  Drug induced acute pancreatitis -based on the work-up he has had, I suspect he more than likely had drug-induced acute pancreatitis from Depakote.  This is a well-known association, recently had the dose increased although it reportedly is an idiosyncratic reaction which can happen at any time.  He has since stopped the medication.  This is his main risk factor for pancreatitis based on review of his chart.  His LFTs were normal, I do not think he had biliary pancreatitis.  He does not drink any alcohol, trigs normal, autoimmune markers negative.  The other thing on the differential would be small/occult  pancreatic mass lesion.  Moving forward I would recommend he hold off on taking Depakote.  I do recommend follow-up imaging of his pancreas to ensure no problems with the tail that could have been related to this.  I discussed options with him, we will proceed with MRCP in a few months to reassess this area, this will also reassess his gallbladder as outlined below.  In the interim recommend he stay on a low-fat diet for a few more weeks while he recovers.  If he has any recurrence of symptoms or worsening in the interim he should let me know.  All questions answered he agreed with the plan  Abnormal gallbladder imaging - unclear if he has a polyp versus stone in his gallbladder.  Hopefully the MRCP as above will reevaluate this and clarify, if not he may need a follow-up ultrasound to determine if he truly has a polyp, this will need some surveillance imaging down the road.  He has no biliary colic symptoms at baseline.  Otherwise we will reach out to his previous GI physicians office to get records from his prior endoscopy and colonoscopy.  Smiley Cellar, MD [facility]  CC: Martinique, Betty G, MD

## 2020-06-11 ENCOUNTER — Telehealth: Payer: Self-pay

## 2020-06-11 ENCOUNTER — Other Ambulatory Visit: Payer: Self-pay

## 2020-06-11 DIAGNOSIS — R109 Unspecified abdominal pain: Secondary | ICD-10-CM

## 2020-06-11 NOTE — Telephone Encounter (Signed)
Order in [place] for CBC, CMET, and Lipase. Called patient and he agreed to come into our lab for these

## 2020-06-12 ENCOUNTER — Other Ambulatory Visit (INDEPENDENT_AMBULATORY_CARE_PROVIDER_SITE_OTHER): Payer: Medicare Other

## 2020-06-12 DIAGNOSIS — R109 Unspecified abdominal pain: Secondary | ICD-10-CM | POA: Diagnosis not present

## 2020-06-12 LAB — COMPREHENSIVE METABOLIC PANEL
ALT: 19 U/L (ref 0–53)
AST: 14 U/L (ref 0–37)
Albumin: 4.6 g/dL (ref 3.5–5.2)
Alkaline Phosphatase: 48 U/L (ref 39–117)
BUN: 19 mg/dL (ref 6–23)
CO2: 27 mEq/L (ref 19–32)
Calcium: 9.4 mg/dL (ref 8.4–10.5)
Chloride: 102 mEq/L (ref 96–112)
Creatinine, Ser: 1.02 mg/dL (ref 0.40–1.50)
GFR: 80.11 mL/min (ref >60.00)
Glucose, Bld: 89 mg/dL (ref 70–99)
Potassium: 4 mEq/L (ref 3.5–5.1)
Sodium: 139 mEq/L (ref 135–145)
Total Bilirubin: 0.6 mg/dL (ref 0.2–1.2)
Total Protein: 7.3 g/dL (ref 6.0–8.3)

## 2020-06-12 LAB — CBC WITH DIFFERENTIAL/PLATELET
Basophils Absolute: 0 10*3/uL (ref 0.0–0.1)
Basophils Relative: 0.5 % (ref 0.0–3.0)
Eosinophils Absolute: 0.1 10*3/uL (ref 0.0–0.7)
Eosinophils Relative: 1.2 % (ref 0.0–5.0)
HCT: 44.2 % (ref 39.0–52.0)
Hemoglobin: 14.6 g/dL (ref 13.0–17.0)
Lymphocytes Relative: 34.5 % (ref 12.0–46.0)
Lymphs Abs: 2.5 10*3/uL (ref 0.7–4.0)
MCHC: 33.1 g/dL (ref 30.0–36.0)
MCV: 90.5 fl (ref 78.0–100.0)
Monocytes Absolute: 0.4 10*3/uL (ref 0.1–1.0)
Monocytes Relative: 5.3 % (ref 3.0–12.0)
Neutro Abs: 4.2 10*3/uL (ref 1.4–7.7)
Neutrophils Relative %: 58.5 % (ref 43.0–77.0)
Platelets: 233 10*3/uL (ref 150.0–400.0)
RBC: 4.89 Mil/uL (ref 4.22–5.81)
RDW: 13.8 % (ref 11.5–15.5)
WBC: 7.1 10*3/uL (ref 4.0–10.5)

## 2020-06-12 LAB — LIPASE: Lipase: 274 U/L — ABNORMAL HIGH (ref 11.0–59.0)

## 2020-06-13 ENCOUNTER — Ambulatory Visit (INDEPENDENT_AMBULATORY_CARE_PROVIDER_SITE_OTHER): Payer: Medicare Other | Admitting: Family Medicine

## 2020-06-13 ENCOUNTER — Encounter: Payer: Self-pay | Admitting: Family Medicine

## 2020-06-13 ENCOUNTER — Other Ambulatory Visit: Payer: Self-pay

## 2020-06-13 VITALS — BP 132/80 | HR 81 | Resp 16 | Ht 70.0 in | Wt 187.1 lb

## 2020-06-13 DIAGNOSIS — N401 Enlarged prostate with lower urinary tract symptoms: Secondary | ICD-10-CM | POA: Diagnosis not present

## 2020-06-13 DIAGNOSIS — I10 Essential (primary) hypertension: Secondary | ICD-10-CM

## 2020-06-13 DIAGNOSIS — E785 Hyperlipidemia, unspecified: Secondary | ICD-10-CM | POA: Diagnosis not present

## 2020-06-13 DIAGNOSIS — E291 Testicular hypofunction: Secondary | ICD-10-CM | POA: Diagnosis not present

## 2020-06-13 DIAGNOSIS — Z5181 Encounter for therapeutic drug level monitoring: Secondary | ICD-10-CM

## 2020-06-13 DIAGNOSIS — Z0000 Encounter for general adult medical examination without abnormal findings: Secondary | ICD-10-CM | POA: Diagnosis not present

## 2020-06-13 DIAGNOSIS — Z23 Encounter for immunization: Secondary | ICD-10-CM

## 2020-06-13 DIAGNOSIS — R351 Nocturia: Secondary | ICD-10-CM

## 2020-06-13 NOTE — Progress Notes (Signed)
HPI: Chase Hunter. is a 59 y.o. male, who is here today for AVV and follow up.  On disability because bipolar disorder.  He was last seen on 05/14/20 for hospital follow up. Since his last visit, he has seen GI to follow on pancreatitis.  He lives with hiswife. He is exercising regularly, walks 3-4 times per week. In general he follows a healthful diet. Independent ADL's and IADL's.  Bipolar disorder, he follows with psychiatrist.  Functional Status Survey: Is the patient deaf or have difficulty hearing?: No Does the patient have difficulty seeing, even when wearing glasses/contacts?: No Does the patient have difficulty concentrating, remembering, or making decisions?: No Does the patient have difficulty walking or climbing stairs?: No Does the patient have difficulty dressing or bathing?: No Does the patient have difficulty doing errands alone such as visiting a doctor's office or shopping?: No  Fall Risk  06/13/2020  Falls in the past year? 0  Number falls in past yr: 0  Injury with Fall? 0  Follow up Education provided   Providers he sees regularly: Eye care provider: Dr Mickie Hillier Psychiatrist: Dr Trevor Mace GI: Dr Havery Moros.  Depression screen [facility] 2/9 06/13/2020  Decreased Interest 0  Down, Depressed, Hopeless 0  PHQ - 2 Score 0    Mini-Cog - 06/13/20 1315    Normal clock drawing test? yes    How many words correct? 3           Hearing Screening   125Hz  250Hz  500Hz  1000Hz  2000Hz  3000Hz  4000Hz  6000Hz  8000Hz   Right ear:   Fail Fail Pass  Pass    Left ear:   Fail Fail Pass  Pass      Visual Acuity Screening   Right eye Left eye Both eyes  Without correction:     With correction: 20/30 20/25 20/20    Immunization History  Administered Date(s) Administered  . Influenza,inj,Quad PF,6+ Mos 09/07/2015, 10/23/2016, 08/03/2018, 07/15/2019, 06/13/2020  . Moderna SARS-COVID-2 Vaccination 11/25/2019  . Pneumococcal Polysaccharide-23 01/22/2017  . Tdap  08/03/2017  . Zoster Recombinat (Shingrix) 12/06/2016, 02/28/2017   Hep C screening 08/15/19 NR.  Hypogonadism: He is on testosterone cypionate 100 mg weekly. Tolerating medication well.  She has not noted increased urine frequency. Nocturia x 1-2, stable for a while. + Urgency. Negative for gross hematuria and decreased urine output.  Lab Results  Component Value Date   WBC 7.1 06/12/2020   HGB 14.6 06/12/2020   HCT 44.2 06/12/2020   MCV 90.5 06/12/2020   PLT 233.0 06/12/2020    Lab Results  Component Value Date   TESTOSTERONE 495.04 07/15/2019   Lab Results  Component Value Date   ALT 19 06/12/2020   AST 14 06/12/2020   ALKPHOS 48 06/12/2020   BILITOT 0.6 06/12/2020   HTN: He is on Lisinopril 40 mg daily and Carvedilol 3.125 mg bid. Negative for severe/frequent headache, visual changes, chest pain, dyspnea, palpitation,focal weakness, or edema. Home BP readings 120's/80's.  Lab Results  Component Value Date   CREATININE 1.02 06/12/2020   BUN 19 06/12/2020   NA 139 06/12/2020   K 4.0 06/12/2020   CL 102 06/12/2020   CO2 27 06/12/2020   HLD: He is on non pharmacologic treatment. Poor tolerance to statins.  Lab Results  Component Value Date   CHOL 213 (H) 05/14/2020   HDL 37 (L) 05/14/2020   LDLCALC 145 (H) 05/14/2020   LDLDIRECT 176.0 07/15/2019   TRIG 177 (H) 05/14/2020   CHOLHDL 5.8 (H) 05/14/2020  Review of Systems  Constitutional: Negative for activity change, appetite change, fatigue and fever.  HENT: Negative for mouth sores, nosebleeds, sore throat and trouble swallowing.   Eyes: Negative for redness and visual disturbance.  Respiratory: Negative for cough, shortness of breath and wheezing.   Cardiovascular: Negative for chest pain, palpitations and leg swelling.  Gastrointestinal: Negative for abdominal pain, nausea and vomiting.  Genitourinary: Negative for decreased urine volume, dysuria and hematuria.  Musculoskeletal: Negative for gait  problem and myalgias.  Neurological: Negative for syncope, weakness and headaches.  Psychiatric/Behavioral: Negative for confusion and hallucinations.  Rest of ROS, see pertinent positives sand negatives in HPI  Current Outpatient Medications on File Prior to Visit  Medication Sig Dispense Refill  . albuterol (PROVENTIL) (2.5 MG/3ML) 0.083% nebulizer solution Take 2.5 mg by nebulization every 6 (six) hours as needed for wheezing or shortness of breath.    Marland Kitchen albuterol (VENTOLIN HFA) 108 (90 Base) MCG/ACT inhaler Inhale 2 puffs into the lungs every 6 (six) hours as needed for wheezing or shortness of breath.    . carvedilol (COREG) 3.125 MG tablet Take 1/2 tablet every day.    . Cholecalciferol (VITAMIN D3) 50 MCG (2000 UT) capsule Take 2,000 Units by mouth daily.    . fluticasone (FLONASE) 50 MCG/ACT nasal spray Place 1 spray into both nostrils daily.    . Fluticasone-Salmeterol (ADVAIR) 250-50 MCG/DOSE AEPB Inhale 1 puff into the lungs 2 (two) times daily.    . INSULIN SYRINGE 1CC/29G (EXEL COMFORT POINT INSULIN SYR) 29G X 1/2" 1 ML MISC Inject testosterone 0.5 ml weekly 100 each 2  . lisinopril (ZESTRIL) 40 MG tablet TAKE 1 TABLET BY MOUTH EVERY DAY 90 tablet 1  . Omega-3 Fatty Acids (FISH OIL) 1200 MG CAPS Take 1 capsule by mouth daily.    . QUEtiapine (SEROQUEL) 300 MG tablet Take 300 mg by mouth at bedtime.    . sildenafil (VIAGRA) 50 MG tablet Take 1 tablet (50 mg total) by mouth daily as needed for erectile dysfunction. 1/2 to 1 tablet daily as needed 15 tablet 3   No current facility-administered medications on file prior to visit.   Past Medical History:  Diagnosis Date  . Allergy   . Anemia   . Asthma   . Bipolar II disorder ([place])   . Depression   . Gallstones   . GERD (gastroesophageal reflux disease)   . Hx of adenomatous colonic polyps   . Hyperlipidemia   . Hypertension   . Pancreatitis    Allergies  Allergen Reactions  . Cephalexin Hives  . Lipitor [Atorvastatin]  Rash    Other reaction(s): Other (See Comments) Memory issues that have not resolved    Social History   Socioeconomic History  . Marital status: Married    Spouse name: Not on file  . Number of children: 1  . Years of education: Not on file  . Highest education level: Not on file  Occupational History  . Occupation: retired Pharmacist, hospital  Tobacco Use  . Smoking status: Never Smoker  . Smokeless tobacco: Never Used  Substance and Sexual Activity  . Alcohol use: Not on file  . Drug use: Never  . Sexual activity: Not on file  Other Topics Concern  . Not on file  Social History Narrative  . Not on file   Social Determinants of Health   Financial Resource Strain:   . Difficulty of Paying Living Expenses: Not on file  Food Insecurity:   . Worried About Estate manager/land agent  of Food in the Last Year: Not on file  . Ran Out of Food in the Last Year: Not on file  Transportation Needs:   . Lack of Transportation (Medical): Not on file  . Lack of Transportation (Non-Medical): Not on file  Physical Activity:   . Days of Exercise per Week: Not on file  . Minutes of Exercise per Session: Not on file  Stress:   . Feeling of Stress : Not on file  Social Connections:   . Frequency of Communication with Friends and Family: Not on file  . Frequency of Social Gatherings with Friends and Family: Not on file  . Attends Religious Services: Not on file  . Active Member of Clubs or Organizations: Not on file  . Attends Archivist Meetings: Not on file  . Marital Status: Not on file    Vitals:   06/13/20 1152  BP: 132/80  Pulse: 81  Resp: 16  SpO2: 99%   Body mass index is 26.85 kg/m.  Physical Exam Vitals and nursing note reviewed.  Constitutional:      General: He is not in acute distress.    Appearance: He is well-developed.  HENT:     Head: Normocephalic and atraumatic.     Mouth/Throat:     Mouth: Mucous membranes are moist.     Pharynx: Oropharynx is clear.  Eyes:      Conjunctiva/sclera: Conjunctivae normal.     Pupils: Pupils are equal, round, and reactive to light.  Cardiovascular:     Rate and Rhythm: Normal rate and regular rhythm.     Pulses:          Dorsalis pedis pulses are 2+ on the right side and 2+ on the left side.     Heart sounds: No murmur heard.   Pulmonary:     Effort: Pulmonary effort is normal. No respiratory distress.     Breath sounds: Normal breath sounds.  Abdominal:     Palpations: Abdomen is soft. There is no hepatomegaly or mass.     Tenderness: There is no abdominal tenderness.  Lymphadenopathy:     Cervical: No cervical adenopathy.  Skin:    General: Skin is warm.     Findings: No erythema or rash.  Neurological:     General: No focal deficit present.     Mental Status: He is alert and oriented to person, place, and time.     Cranial Nerves: No cranial nerve deficit.     Gait: Gait normal.  Psychiatric:        Mood and Affect: Mood and affect normal.     Comments: Well groomed, good eye contact.    ASSESSMENT AND PLAN:   Mr. Trevar Hunter. was seen today for AWV and follow-up.  Orders Placed This Encounter  Procedures  . Flu Vaccine QUAD 36+ mos IM  . Testosterone  . PSA   Lab Results  Component Value Date   PSA 1.63 06/13/2020    Lab Results  Component Value Date   TESTOSTERONE 867 (H) 06/13/2020    The 10-year ASCVD risk score Mikey Bussing DC Jr., et al., 2013) is: 12%   Values used to calculate the score:     Age: 11 years     Sex: Male     Is Non-Hispanic African American: No     Diabetic: No     Tobacco smoker: No     Systolic Blood Pressure: 119 mmHg     Is BP  treated: Yes     HDL Cholesterol: 37 mg/dL     Total Cholesterol: 213 mg/dL   Medicare annual wellness visit, initial We discussed the importance of staying active, physically and mentally, as well as the benefits of a healthy/balance diet. Low impact exercise that involve stretching and strengthing are ideal. Vaccines up to  date. We discussed preventive screening for the next 5-10 years, summery of recommendations given in AVS. Colonoscopy in 2025, I do not have copy of last one (05/22/14). Fall prevention.  Advance directives and end of life discussed, he has POA, not sure about living will. Advance directive package given.   Hypertension, essential, benign BP adequately controlled. Continue monitoring BP regularly. Continue Lisinopril 40 mg and Carvedilol 3.125 mg bid. Low salt diet.  Hyperlipidemia, unspecified hyperlipidemia type Mixed HLD, mild. 10 years CVD risk 12%. He has not tolerated statins in the past, prefers not to try again. Continue non pharmacologic treatment.  Hypogonadism in male Tolerating medication well. Continue testosterone 100 mg weekly. PDMP reviewed. Further recommendations according to testosterone result.  -     testosterone cypionate (DEPOTESTOSTERONE CYPIONATE) 200 MG/ML injection; INJECT 100 MG (0.5 ML) AS DIRECTED EVERY 7 (SEVEN) DAYS.  BPH associated with nocturia Stable symptoms. We discussed side effects of testosterone.  Need for influenza vaccination -     Flu Vaccine QUAD 36+ mos IM  Return in about 5 months (around 11/11/2020).  Zakar Brosch G. Martinique, MD  [facility]. [place] office.   Mr. Pangilinan , Thank you for taking time to come for your Medicare Wellness Visit. I appreciate your ongoing commitment to your health goals. Please review the following plan we discussed and let me know if I can assist you in the future.   These are the goals we discussed: Goals    .  Acknowledge receipt of Advanced Directive package     Review forms and consider signing them in front a notary. Please bring a copy to [place].       This is a list of the screening recommended for you and due dates:  Health Maintenance  Topic Date Due  . HIV Screening  Never done  . COVID-19 Vaccine (2 - Moderna 2-dose series) 12/23/2019  . Flu Shot  11/29/2020*  . Colon Cancer  Screening  05/22/2024  . Tetanus Vaccine  08/04/2027  .  Hepatitis C: One time screening is recommended by Center for Disease Control  (CDC) for  adults born from [birth year] through 1965.   Completed  *Topic was postponed. The date shown is not the original due date.    A few things to remember from today's visit:   Hypertension, essential, benign  Hyperlipidemia, unspecified hyperlipidemia type  Hypogonadism in male - Plan: Testosterone  Encounter for monitoring testosterone replacement therapy  BPH associated with nocturia - Plan: PSA  Medicare annual wellness visit, initial  If you need refills please call your pharmacy. Do not use My Chart to request refills or for acute issues that need immediate attention.   Please be sure medication list is accurate. If a new problem present, please set up appointment sooner than planned today.

## 2020-06-13 NOTE — Patient Instructions (Addendum)
  Chase Hunter , Thank you for taking time to come for your Medicare Wellness Visit. I appreciate your ongoing commitment to your health goals. Please review the following plan we discussed and let me know if I can assist you in the future.   These are the goals we discussed: Goals    .  Acknowledge receipt of Advanced Directive package     Review forms and consider signing them in front a notary. Please bring a copy to [place].       This is a list of the screening recommended for you and due dates:  Health Maintenance  Topic Date Due  . HIV Screening  Never done  . COVID-19 Vaccine (2 - Moderna 2-dose series) 12/23/2019  . Flu Shot  11/29/2020*  . Colon Cancer Screening  05/22/2024  . Tetanus Vaccine  08/04/2027  .  Hepatitis C: One time screening is recommended by Center for Disease Control  (CDC) for  adults born from [birth year] through 1965.   Completed  *Topic was postponed. The date shown is not the original due date.    A few things to remember from today's visit:   Hypertension, essential, benign  Hyperlipidemia, unspecified hyperlipidemia type  Hypogonadism in male - Plan: Testosterone  Encounter for monitoring testosterone replacement therapy  BPH associated with nocturia - Plan: PSA  Medicare annual wellness visit, initial  If you need refills please call your pharmacy. Do not use My Chart to request refills or for acute issues that need immediate attention.   Please be sure medication list is accurate. If a new problem present, please set up appointment sooner than planned today.

## 2020-06-14 LAB — TESTOSTERONE: Testosterone: 867 ng/dL — ABNORMAL HIGH (ref 250–827)

## 2020-06-14 LAB — PSA: PSA: 1.63 ng/mL (ref <=4.0)

## 2020-06-16 MED ORDER — TESTOSTERONE CYPIONATE 200 MG/ML IM SOLN: INTRAMUSCULAR | 2 refills | Status: DC

## 2020-06-20 ENCOUNTER — Ambulatory Visit (HOSPITAL_COMMUNITY)
Admission: RE | Admit: 2020-06-20 | Discharge: 2020-06-20 | Disposition: A | Discharge status: Home / Self Care | Payer: Medicare Other | Source: Ambulatory Visit | Attending: Gastroenterology | Admitting: Gastroenterology

## 2020-06-20 ENCOUNTER — Other Ambulatory Visit: Payer: Self-pay

## 2020-06-20 ENCOUNTER — Other Ambulatory Visit: Payer: Self-pay | Admitting: Gastroenterology

## 2020-06-20 DIAGNOSIS — K853 Drug induced acute pancreatitis without necrosis or infection: Secondary | ICD-10-CM

## 2020-06-20 IMAGING — MR MR ABDOMEN WO/W CM MRCP
18 of 21 series · 41 of 48 positions shown · IV contrast (gadavist)
Comparison: Report from outside CT abdomen pelvis from [DATE],
included in Dr. SHIMPEI progress note of [DATE].

CLINICAL DATA: Recent episode of pancreatitis with focal
involvement in the pancreatic tail, reassessing today to make sure
there is not an underlying pancreatic tail lesion and also to assess
for potential gallbladder polyp.

EXAM:
MRI ABDOMEN WITHOUT AND WITH CONTRAST (INCLUDING MRCP)
TECHNIQUE: Multiplanar multisequence MR imaging of the abdomen was performed
both before and after the administration of intravenous contrast.
Heavily T2-weighted images of the biliary and pancreatic ducts were
obtained, and three-dimensional MRCP images were rendered by post
processing.
CONTRAST:  8.5mL GADAVIST GADOBUTROL 1 MMOL/ML IV SOLN

[Series 4: T2 fat-sat · axial · 6.0mm · 1.25mm/px · 1 of 36 slices shown]
[im 1/36]
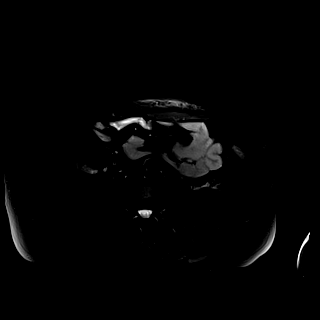

[Series 6: DWI · axial · 6.0mm · 1.49mm/px · z∈[-103,+149]mm · 2 of 72 slices shown (1 of 2)]
[im 1/72]
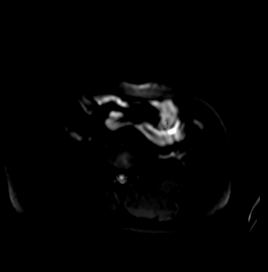
[im 72/72]
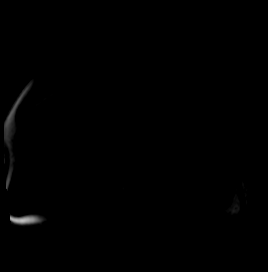

[Series 7: DWI · axial · 6.0mm · 1.49mm/px · 1 of 36 slices shown (2 of 2)]
[im 1/36]
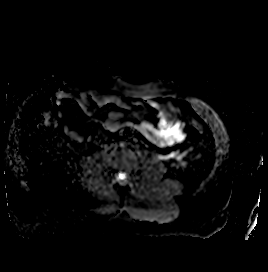

[Series 9: T2 · coronal · 6.0mm · 1.48mm/px · 1 of 30 slices shown (1 of 2)]
[im 1/30]
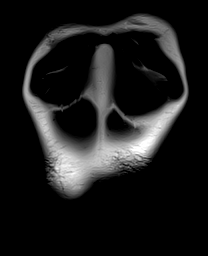

[Series 12: cor_3d_spc_trig · coronal · 1.0mm · 0.49mm/px · 2 of 72 slices shown]
[im 1/72]
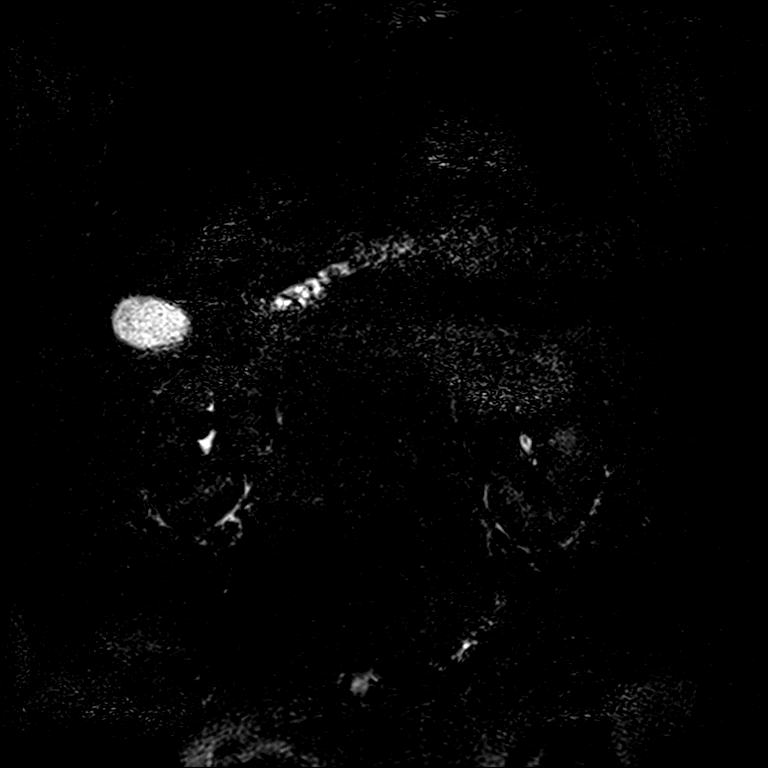
[im 72/72]
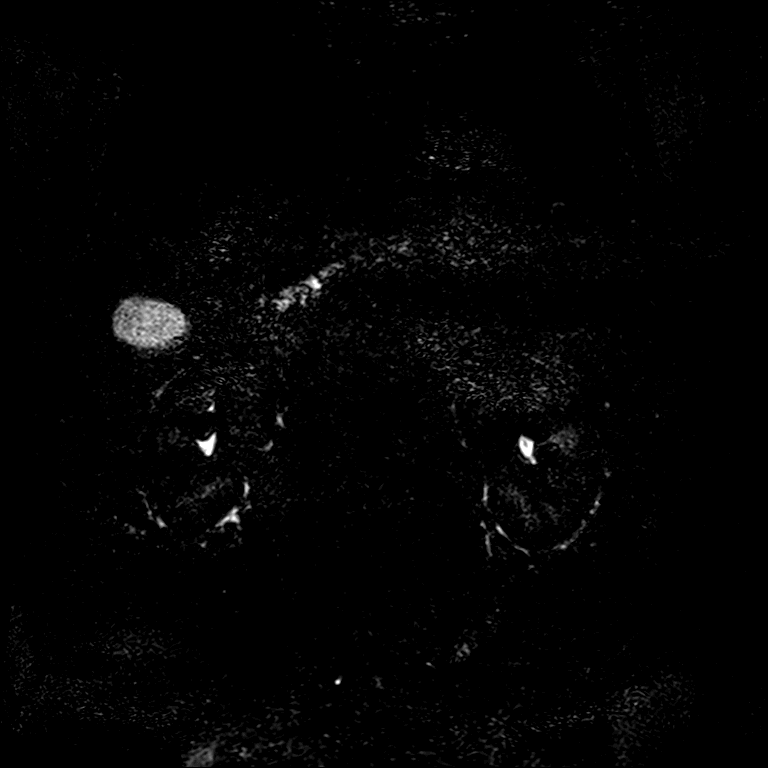

[Series 14: T1 · axial · 3.0mm · 1.25mm/px · z∈[-108,+105]mm · 2 of 72 slices shown (1 of 2)]
[im 1/72]
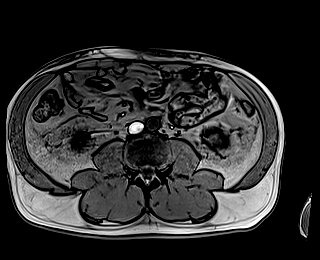
[im 72/72]
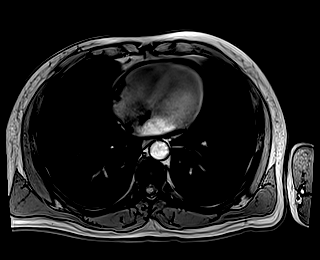

[Series 15: T1 · axial · 3.0mm · 1.25mm/px · z∈[-108,+105]mm · 3 of 72 slices shown (2 of 2)]
[im 1/72]
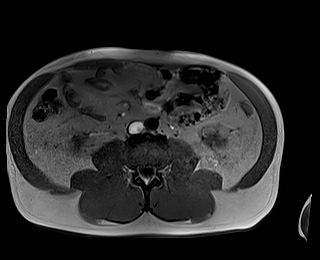
[im 36/72]
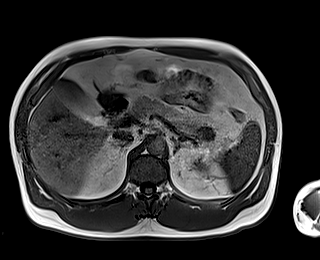
[im 72/72]
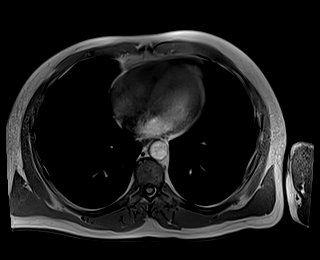

[Series 16: cor obl thk · sagittal · 50.0mm · 0.78mm/px · 1 of 9 slices shown]
[im 1/9]
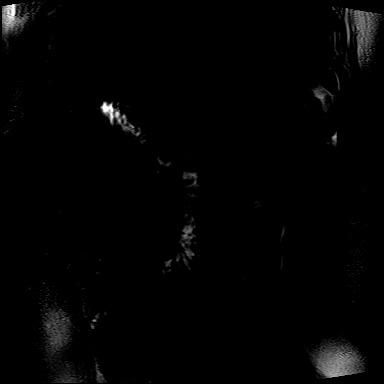

[Series 17: T2 · axial · 6.0mm · 1.56mm/px · 1 of 30 slices shown (2 of 2)]
[im 1/30]
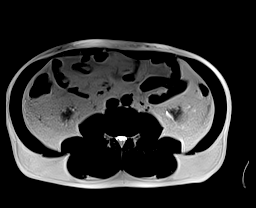

[Series 19: T1 dynamic · axial · 3.0mm · 1.25mm/px · z∈[-137,+100]mm · 3 of 80 slices shown (1 of 9)]
[im 1/80]
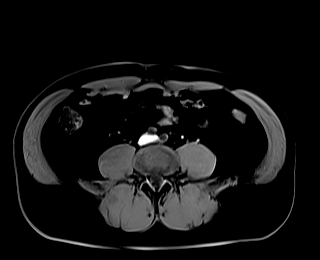
[im 40/80]
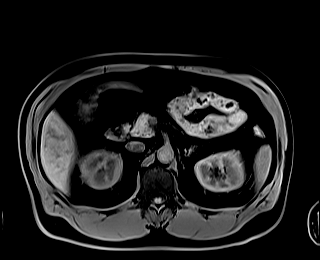
[im 80/80]
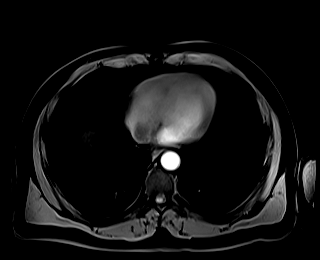

[Series 23: T1 dynamic · axial · 3.0mm · 1.25mm/px · z∈[-137,+100]mm · 3 of 80 slices shown (2 of 9)]
[im 1/80]
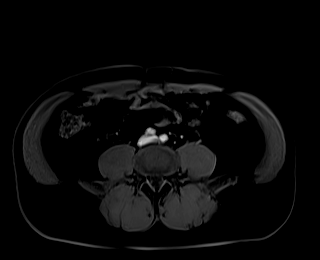
[im 40/80]
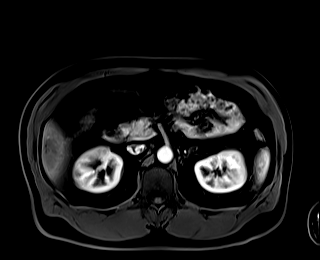
[im 80/80]
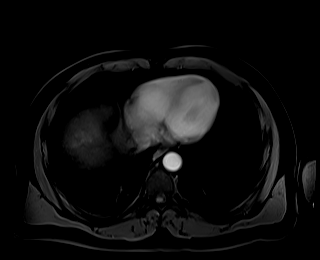

[Series 24: T1 dynamic · axial · 3.0mm · 1.25mm/px · z∈[-137,+100]mm · 3 of 80 slices shown (3 of 9)]
[im 1/80]
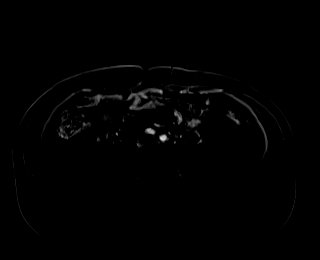
[im 40/80]
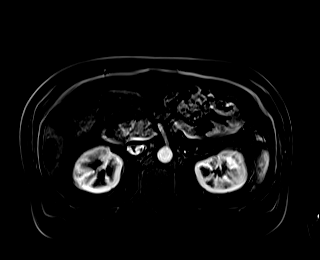
[im 80/80]
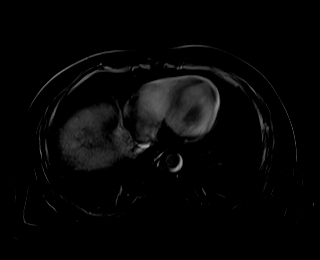

[Series 27: T1 dynamic · axial · 3.0mm · 1.25mm/px · z∈[-137,+100]mm · 3 of 80 slices shown (4 of 9)]
[im 1/80]
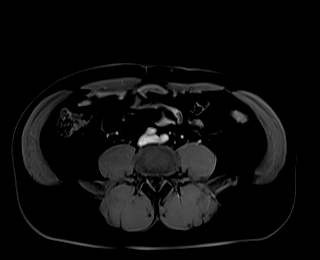
[im 40/80]
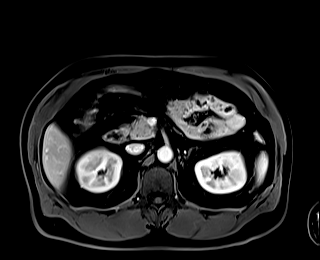
[im 80/80]
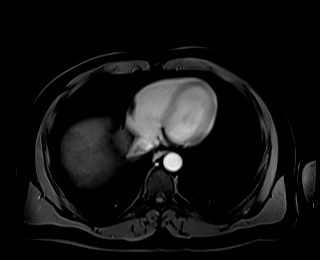

[Series 28: T1 dynamic · axial · 3.0mm · 1.25mm/px · z∈[-137,+100]mm · 3 of 80 slices shown (5 of 9)]
[im 1/80]
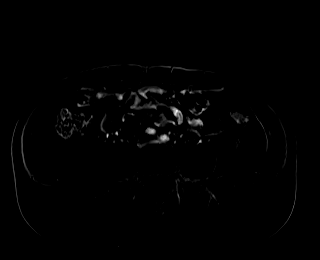
[im 40/80]
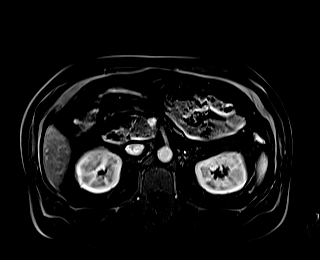
[im 80/80]
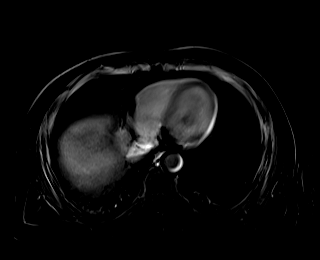

[Series 31: T1 dynamic · axial · 3.0mm · 1.25mm/px · z∈[-137,+100]mm · 3 of 80 slices shown (6 of 9)]
[im 1/80]
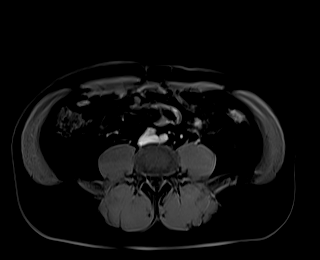
[im 40/80]
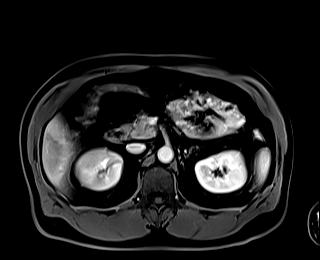
[im 80/80]
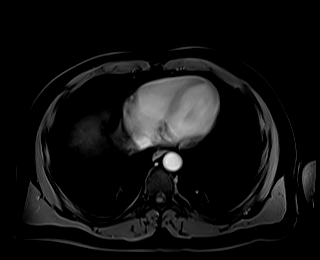

[Series 32: T1 dynamic · axial · 3.0mm · 1.25mm/px · z∈[-137,+100]mm · 3 of 80 slices shown (7 of 9)]
[im 1/80]
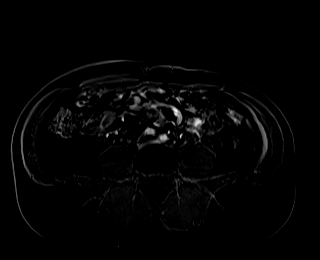
[im 40/80]
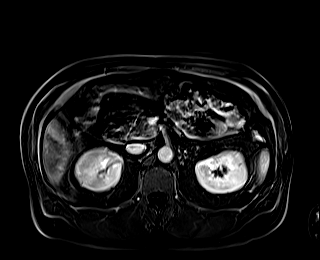
[im 80/80]
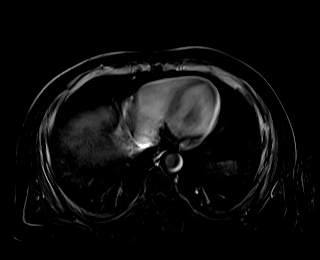

[Series 34: T1 dynamic · coronal · 3.0mm · 1.41mm/px · 3 of 72 slices shown (8 of 9)]
[im 1/72]
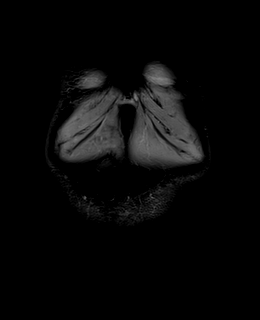
[im 36/72]
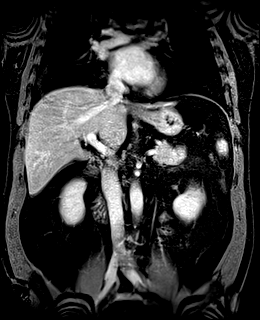
[im 72/72]
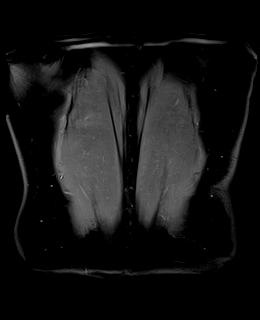

[Series 36: T1 dynamic · axial · 3.0mm · 1.25mm/px · z∈[-137,+100]mm · 3 of 80 slices shown (9 of 9)]
[im 1/80]
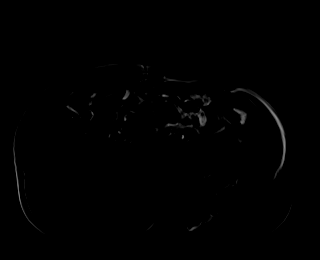
[im 40/80]
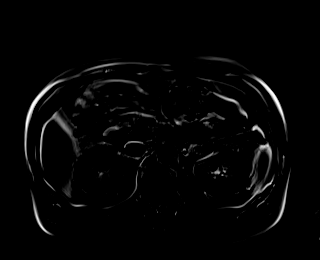
[im 80/80]
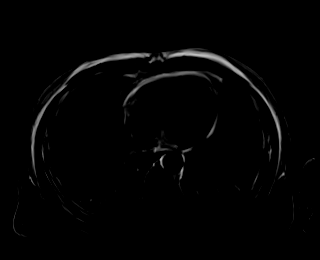

[41 of 48 positions shown; findings below may reference images not displayed]

FINDINGS: Lower chest: Unremarkable

Hepatobiliary: No filling defect in the gallbladder is identified to
favor polyp or gallstone. No biliary dilatation. No focal liver
lesion.

Pancreas: Slightly indistinct tip of the tail the pancreas abutting
a 2.4 by 1.9 by 1.7 cm enhancing structure along the tip of the
pancreatic tail which appears to follow spleen on all imaging
sequences and postcontrast sequences, and accordingly likely
represents an accessory spleen along the margin of the pancreatic
tail tip. Separate from this accessory spleen, I do not see a
definite additional lesion in the pancreatic tail. There is some
trace stranding in this vicinity, probably residua from prior
pancreatitis. Dorsal pancreatic duct is of normal caliber. No
pancreas divisum.

Spleen: As noted above, there seems to be an accessory spleen
tangential to the tip of the tail the pancreas. Otherwise spleen
appears unremarkable.

Adrenals/Urinary Tract:  Unremarkable

Stomach/Bowel: Unremarkable

Vascular/Lymphatic:  Unremarkable.  No adenopathy.

Other:  No supplemental non-categorized findings.

Musculoskeletal: Unremarkable
IMPRESSION: 1. There is a 2.4 by 1.9 by 1.7 cm enhancing structure along the tip
of the pancreatic tail which follows spleen parenchyma signal on all
imaging sequences and postcontrast sequences, and accordingly is
compatible with accessory spleen along the margin of the pancreatic
tail tip. Separate from this accessory spleen, I do not see a
definite lesion in the pancreatic tail. There is some trace
stranding along the tip of the tail the pancreas, probably minimal
residua from prior pancreatitis.
2. No biliary dilatation or choledocholithiasis is identified. I do
not see a gallbladder polyp or filling defect in the gallbladder,
very small polyps could conceivably be obscured by motion artifact.

## 2020-06-20 IMAGING — MR MR 3D RECON AT SCANNER
16 of 20 series · 36 of 48 positions shown · IV contrast (gadavist)
Comparison: Report from outside CT abdomen pelvis from [DATE],
included in Dr. SHIMPEI progress note of [DATE].

CLINICAL DATA: Recent episode of pancreatitis with focal
involvement in the pancreatic tail, reassessing today to make sure
there is not an underlying pancreatic tail lesion and also to assess
for potential gallbladder polyp.

EXAM:
MRI ABDOMEN WITHOUT AND WITH CONTRAST (INCLUDING MRCP)
TECHNIQUE: Multiplanar multisequence MR imaging of the abdomen was performed
both before and after the administration of intravenous contrast.
Heavily T2-weighted images of the biliary and pancreatic ducts were
obtained, and three-dimensional MRCP images were rendered by post
processing.
CONTRAST:  8.5mL GADAVIST GADOBUTROL 1 MMOL/ML IV SOLN

[Series 4: T2 fat-sat · axial · 6.0mm · 1.25mm/px · 1 of 36 slices shown]
[im 1/36]
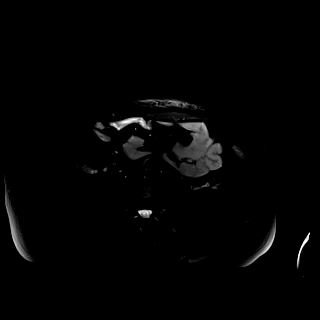

[Series 6: DWI · axial · 6.0mm · 1.49mm/px · z∈[-103,+149]mm · 2 of 72 slices shown (1 of 2)]
[im 1/72]
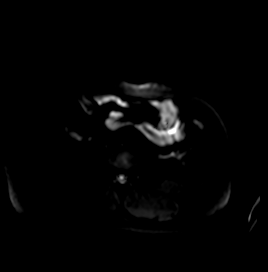
[im 72/72]
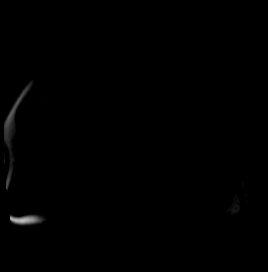

[Series 7: DWI · axial · 6.0mm · 1.49mm/px · 1 of 36 slices shown (2 of 2)]
[im 1/36]
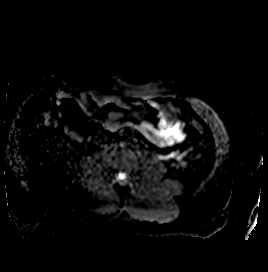

[Series 9: T2 · coronal · 6.0mm · 1.48mm/px · 1 of 30 slices shown (1 of 2)]
[im 1/30]
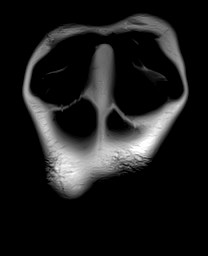

[Series 12: cor_3d_spc_trig · coronal · 1.0mm · 0.49mm/px · 3 of 72 slices shown]
[im 1/72]
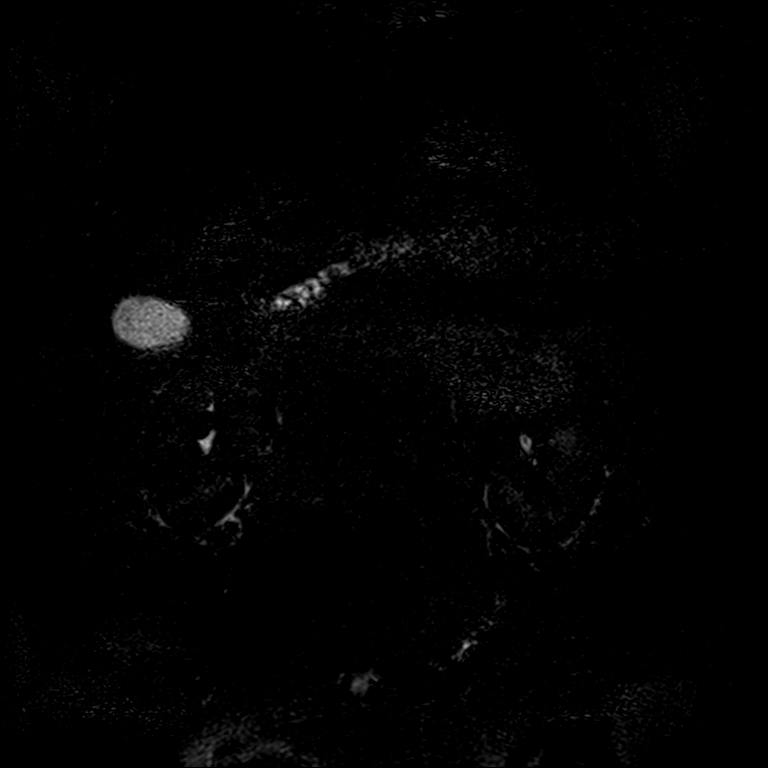
[im 36/72]
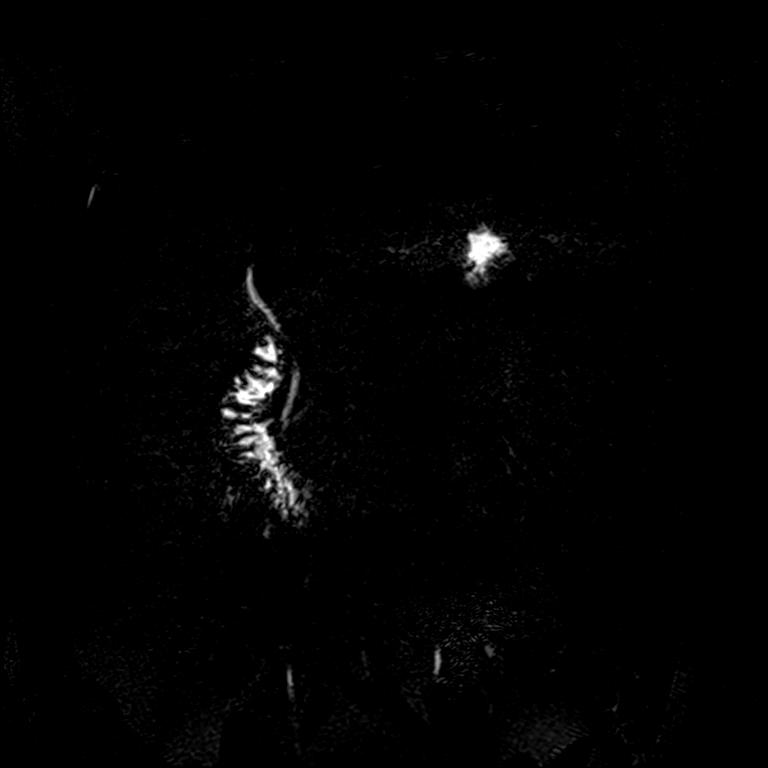
[im 72/72]
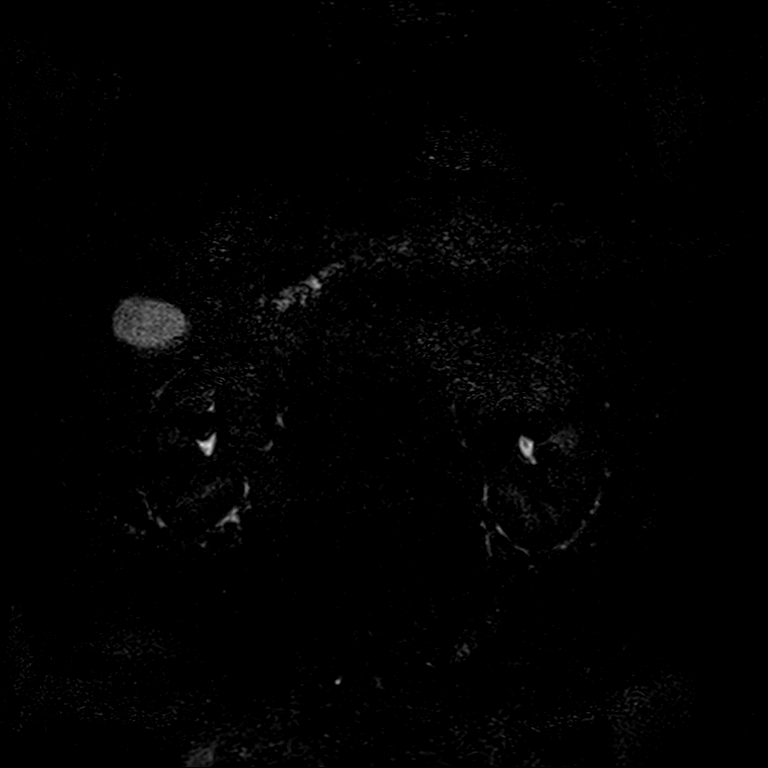

[Series 14: T1 · axial · 3.0mm · 1.25mm/px · z∈[-108,+105]mm · 3 of 72 slices shown (1 of 2)]
[im 1/72]
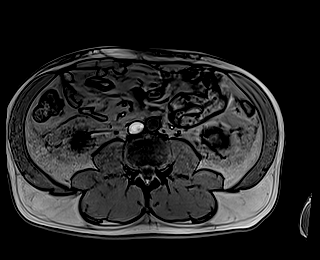
[im 36/72]
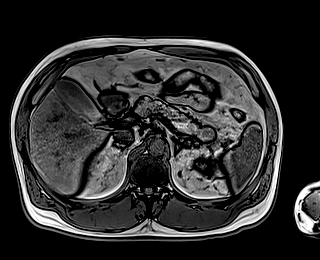
[im 72/72]
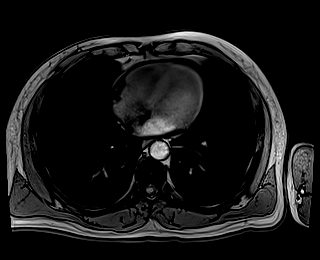

[Series 15: T1 · axial · 3.0mm · 1.25mm/px · z∈[-108,+105]mm · 3 of 72 slices shown (2 of 2)]
[im 1/72]
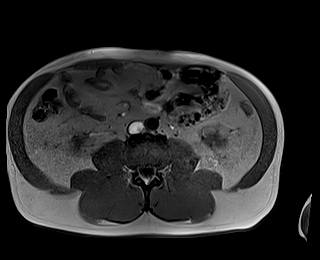
[im 36/72]
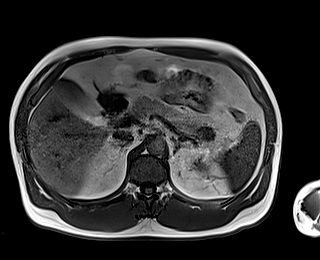
[im 72/72]
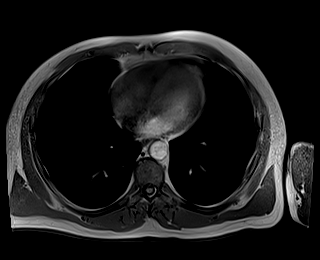

[Series 16: cor obl thk · sagittal · 50.0mm · 0.78mm/px · 1 of 9 slices shown]
[im 1/9]
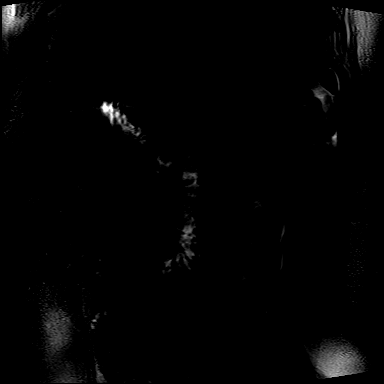

[Series 17: T2 · axial · 6.0mm · 1.56mm/px · 1 of 30 slices shown (2 of 2)]
[im 1/30]
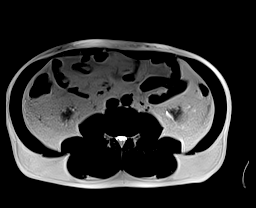

[Series 19: T1 dynamic · axial · 3.0mm · 1.25mm/px · z∈[-137,+100]mm · 3 of 80 slices shown (1 of 7)]
[im 1/80]
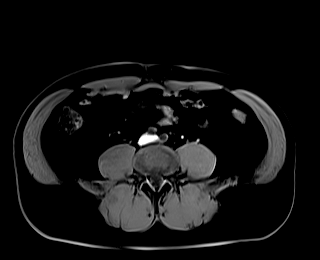
[im 40/80]
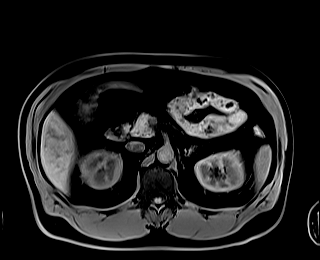
[im 80/80]
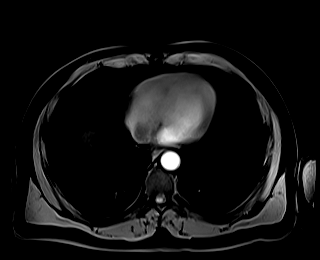

[Series 23: T1 dynamic · axial · 3.0mm · 1.25mm/px · z∈[-137,+100]mm · 3 of 80 slices shown (2 of 7)]
[im 1/80]
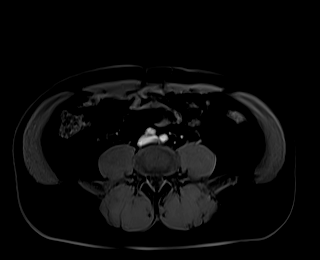
[im 40/80]
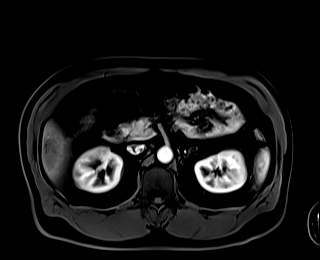
[im 80/80]
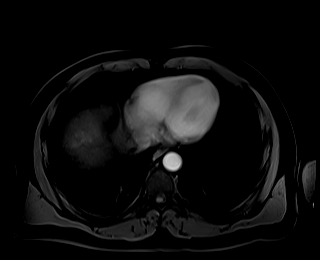

[Series 24: T1 dynamic · axial · 3.0mm · 1.25mm/px · z∈[-137,+100]mm · 3 of 80 slices shown (3 of 7)]
[im 1/80]
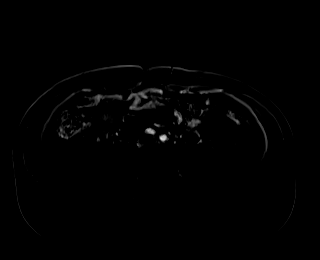
[im 40/80]
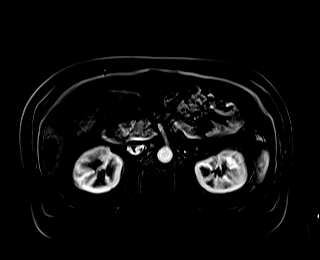
[im 80/80]
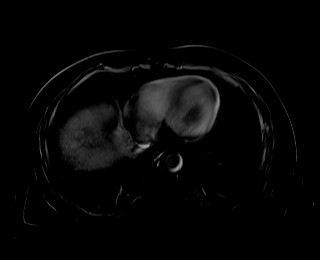

[Series 27: T1 dynamic · axial · 3.0mm · 1.25mm/px · z∈[-137,+100]mm · 3 of 80 slices shown (4 of 7)]
[im 1/80]
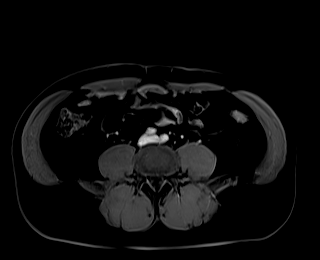
[im 40/80]
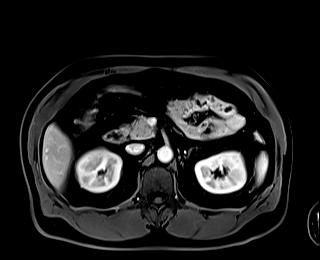
[im 80/80]
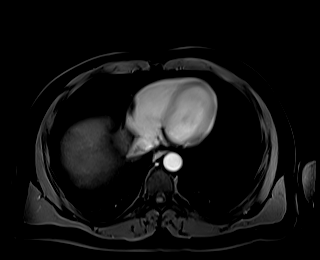

[Series 28: T1 dynamic · axial · 3.0mm · 1.25mm/px · z∈[-137,+100]mm · 3 of 80 slices shown (5 of 7)]
[im 1/80]
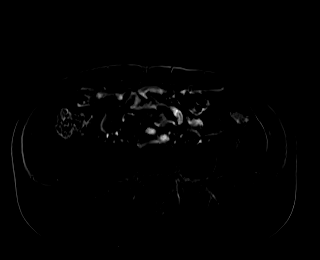
[im 40/80]
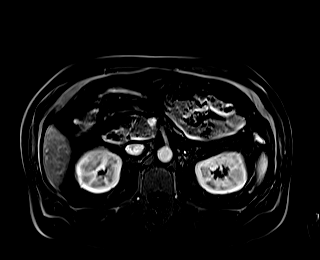
[im 80/80]
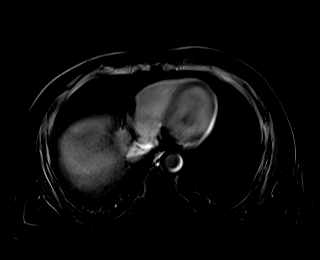

[Series 31: T1 dynamic · axial · 3.0mm · 1.25mm/px · z∈[-137,+100]mm · 3 of 80 slices shown (6 of 7)]
[im 1/80]
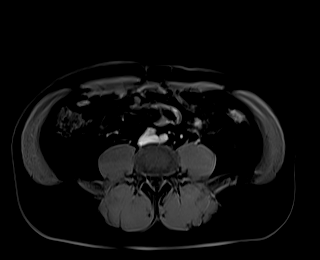
[im 40/80]
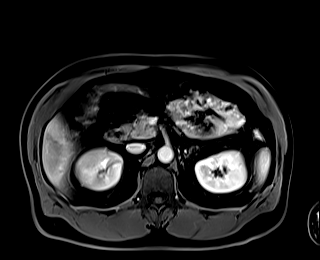
[im 80/80]
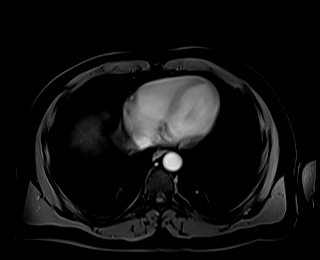

[Series 32: T1 dynamic · axial · 3.0mm · 1.25mm/px · z∈[-137,-20]mm · 2 of 80 slices shown (7 of 7)]
[im 1/80]
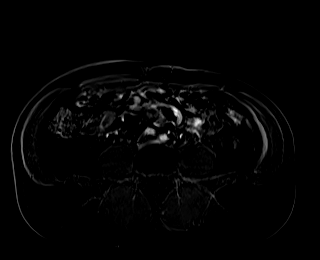
[im 40/80]
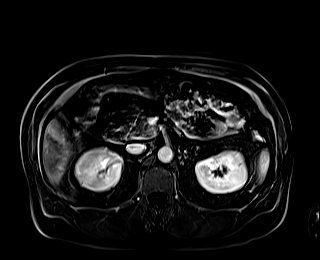

[36 of 48 positions shown; findings below may reference images not displayed]

FINDINGS: Lower chest: Unremarkable

Hepatobiliary: No filling defect in the gallbladder is identified to
favor polyp or gallstone. No biliary dilatation. No focal liver
lesion.

Pancreas: Slightly indistinct tip of the tail the pancreas abutting
a 2.4 by 1.9 by 1.7 cm enhancing structure along the tip of the
pancreatic tail which appears to follow spleen on all imaging
sequences and postcontrast sequences, and accordingly likely
represents an accessory spleen along the margin of the pancreatic
tail tip. Separate from this accessory spleen, I do not see a
definite additional lesion in the pancreatic tail. There is some
trace stranding in this vicinity, probably residua from prior
pancreatitis. Dorsal pancreatic duct is of normal caliber. No
pancreas divisum.

Spleen: As noted above, there seems to be an accessory spleen
tangential to the tip of the tail the pancreas. Otherwise spleen
appears unremarkable.

Adrenals/Urinary Tract:  Unremarkable

Stomach/Bowel: Unremarkable

Vascular/Lymphatic:  Unremarkable.  No adenopathy.

Other:  No supplemental non-categorized findings.

Musculoskeletal: Unremarkable
IMPRESSION: 1. There is a 2.4 by 1.9 by 1.7 cm enhancing structure along the tip
of the pancreatic tail which follows spleen parenchyma signal on all
imaging sequences and postcontrast sequences, and accordingly is
compatible with accessory spleen along the margin of the pancreatic
tail tip. Separate from this accessory spleen, I do not see a
definite lesion in the pancreatic tail. There is some trace
stranding along the tip of the tail the pancreas, probably minimal
residua from prior pancreatitis.
2. No biliary dilatation or choledocholithiasis is identified. I do
not see a gallbladder polyp or filling defect in the gallbladder,
very small polyps could conceivably be obscured by motion artifact.

## 2020-06-20 MED ORDER — GADOBUTROL 1 MMOL/ML IV SOLN
8.5000 mL | Freq: Once | INTRAVENOUS | Status: AC | PRN
  Administered 2020-06-20: 8.5 mL via INTRAVENOUS

## 2020-06-21 ENCOUNTER — Telehealth: Payer: Self-pay | Admitting: Gastroenterology

## 2020-06-21 NOTE — Telephone Encounter (Signed)
Records arrived from prior endoscopic evaluation:  EGD 06/02/2014 - Dr. Marylu Lund - moderate gastritis, otherwise normal. Biopsies negative for H pylori  Colonoscopy 05/22/2014 - Dr. Sabra Heck - good prep - 3mm transverse polyp, hemorrhoids, otherwise normal - path c/w hyperplastic polyp  Colonoscopy 11/28/2013 - Dr. Sabra Heck - POOR PREP - 12 mm sigmoid polyp removed - polyp c/w adenoma  Colonoscopy 10/20/2011 - Dr. Sabra Heck - POOR PREP - 2 left sided polyps removed, one was adenomatous     Jan based on review of his colonoscopies he is actually overdue for a surveillance colonoscopy as his last exam was in 2015 and he had a 28mm adenoma removed in that year. 2 of his last 3 colonoscopies were limited by poor prep.   Can you help schedule him for a colonoscopy at the [facility], double prep recommended, if he is willing? Thanks

## 2020-06-22 ENCOUNTER — Other Ambulatory Visit: Payer: Self-pay

## 2020-06-22 DIAGNOSIS — R109 Unspecified abdominal pain: Secondary | ICD-10-CM

## 2020-06-22 DIAGNOSIS — K859 Acute pancreatitis without necrosis or infection, unspecified: Secondary | ICD-10-CM

## 2020-06-22 NOTE — Telephone Encounter (Signed)
Called and spoke to pt.  Scheduled him for colonoscopy on 11-8 and PV on 11-1.

## 2020-06-25 ENCOUNTER — Telehealth: Payer: Self-pay

## 2020-06-25 ENCOUNTER — Other Ambulatory Visit (INDEPENDENT_AMBULATORY_CARE_PROVIDER_SITE_OTHER): Payer: Medicare Other

## 2020-06-25 DIAGNOSIS — K859 Acute pancreatitis without necrosis or infection, unspecified: Secondary | ICD-10-CM

## 2020-06-25 LAB — LIPASE: Lipase: 264 U/L — ABNORMAL HIGH (ref 11.0–59.0)

## 2020-06-25 LAB — CBC WITH DIFFERENTIAL/PLATELET
Basophils Absolute: 0 10*3/uL (ref 0.0–0.1)
Basophils Relative: 0.7 % (ref 0.0–3.0)
Eosinophils Absolute: 0.1 10*3/uL (ref 0.0–0.7)
Eosinophils Relative: 1.2 % (ref 0.0–5.0)
HCT: 44 % (ref 39.0–52.0)
Hemoglobin: 14.9 g/dL (ref 13.0–17.0)
Lymphocytes Relative: 32.6 % (ref 12.0–46.0)
Lymphs Abs: 2.2 10*3/uL (ref 0.7–4.0)
MCHC: 33.8 g/dL (ref 30.0–36.0)
MCV: 88 fl (ref 78.0–100.0)
Monocytes Absolute: 0.4 10*3/uL (ref 0.1–1.0)
Monocytes Relative: 5.9 % (ref 3.0–12.0)
Neutro Abs: 4.1 10*3/uL (ref 1.4–7.7)
Neutrophils Relative %: 59.6 % (ref 43.0–77.0)
Platelets: 195 10*3/uL (ref 150.0–400.0)
RBC: 5 Mil/uL (ref 4.22–5.81)
RDW: 13.8 % (ref 11.5–15.5)
WBC: 6.8 10*3/uL (ref 4.0–10.5)

## 2020-06-25 NOTE — Telephone Encounter (Signed)
-----   Message from Milus Banister, MD sent at 06/24/2020  4:10 PM EDT ----- Regarding: RE: pancreatitis case Chase Hunter, I looked at this images.  I'm not sure if that actually an accessory spleen.  It shouldn't cause pains, elevated lipase if it is though.  I think further testing with upper EUS is a good next step.  If the lesion in tail of pancreas is indeed splenic tissue, then FNA will probably carry increased risk of bleeding. He'll have to be aware of that at time of consent.  If you can let him know then we'll work on scheduling Eus.  Thanks  Tytianna Greenley, See above.  He needs upper EUS, GM or DJ first available for abd pain, abnormal pancreas.  Thanks  Wynetta Fines  ----- Message ----- From: Yetta Flock, MD Sent: 06/22/2020   6:08 PM EDT To: Milus Banister, MD, # Subject: pancreatitis case                              Hey guys. Wanted to pick your brain about this patient and his MRCP. He was admitted with pancreatitis thought to be due to drug reaction which has since been stopped. 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 He's having ongoing pain, was curious to get your thoughts, any role for EUS here? Thanks  Chase Hunter

## 2020-06-26 ENCOUNTER — Other Ambulatory Visit: Payer: Self-pay

## 2020-06-26 ENCOUNTER — Telehealth: Payer: Self-pay | Admitting: Gastroenterology

## 2020-06-26 DIAGNOSIS — R109 Unspecified abdominal pain: Secondary | ICD-10-CM

## 2020-06-26 DIAGNOSIS — K859 Acute pancreatitis without necrosis or infection, unspecified: Secondary | ICD-10-CM

## 2020-06-26 NOTE — Telephone Encounter (Signed)
I spoke with the pt to discuss the appt details and he tells me that he has an appt at the [facility] clinic and does not wish to proceed with our office. I have cancelled the appt.

## 2020-06-26 NOTE — Telephone Encounter (Signed)
Patient will be contacted by Chong Sicilian, RN regarding scheduling of EUS.

## 2020-06-26 NOTE — Telephone Encounter (Signed)
Thanks Patty, I reached out to the patient and explained the situation. He is a bit anxious about waiting until 08/20/20 to have the EUS done, and if that is how long the wait here is, he is exploring other options and has something scheduled at [facility] in the same date. If there is a wait list to be contacted if anyone cancels moving forward, can you add him there? He would be interested in getting it done here if an earlier date opens up. Thank you

## 2020-06-26 NOTE — Telephone Encounter (Signed)
Dr Havery Moros please see the alternate note sent to you today from myself. The pt has an appt at the [facility] and does not wish to proceed with our office for EUS

## 2020-06-26 NOTE — Telephone Encounter (Signed)
EUS scheduled for 08/20/20 at  9 am with Dr Rush Landmark at [facility].  COVID test on 12/16 at 935 am

## 2020-06-27 ENCOUNTER — Other Ambulatory Visit: Payer: Self-pay

## 2020-06-27 ENCOUNTER — Telehealth: Payer: Self-pay

## 2020-06-27 DIAGNOSIS — R109 Unspecified abdominal pain: Secondary | ICD-10-CM

## 2020-06-27 DIAGNOSIS — K859 Acute pancreatitis without necrosis or infection, unspecified: Secondary | ICD-10-CM

## 2020-06-27 NOTE — Telephone Encounter (Signed)
EUS scheduled, pt instructed and medications reviewed.  Patient instructions sent to the pt via My Chart per pt request.  Patient to call with any questions or concerns.

## 2020-06-27 NOTE — Telephone Encounter (Signed)
-----   Message from Milus Banister, MD sent at 06/27/2020  5:43 AM EDT ----- Regarding: RE: pancreatitis case Looks like I can do this on December 2nd actually. Emiah Pellicano, let me know, I don't think I have any EUS cases currently on for December.  Certainly can also add him to wait list.  Thanks  ----- Message ----- From: Yetta Flock, MD Sent: 06/26/2020   4:38 PM EDT To: Milus Banister, MD, Timothy Lasso, RN, # Subject: RE: pancreatitis case                          Thanks guys for your help with this case.  I think the first opening for an EUS is December 20th? This patient has some anxiety about this and doesn't feel well, he is planning on going to [facility] in December to get it evaluated there if no openings sooner, just thought I would see if you can put him on a list in case of any cancellations you have in the interim? If not, he understands. Thanks  Richardson Landry ----- Message ----- From: Milus Banister, MD Sent: 06/24/2020   4:10 PM EDT To: Timothy Lasso, RN, Yetta Flock, MD, # Subject: RE: pancreatitis case                          Richardson Landry, I looked at this images.  I'm not sure if that actually an accessory spleen.  It shouldn't cause pains, elevated lipase if it is though.  I think further testing with upper EUS is a good next step.  If the lesion in tail of pancreas is indeed splenic tissue, then FNA will probably carry increased risk of bleeding. He'll have to be aware of that at time of consent.  If you can let him know then we'll work on scheduling Eus.  Thanks  Kendarius Vigen, See above.  He needs upper EUS, GM or DJ first available for abd pain, abnormal pancreas.  Thanks  Wynetta Fines  ----- Message ----- From: Yetta Flock, MD Sent: 06/22/2020   6:08 PM EDT To: Milus Banister, MD, # Subject: pancreatitis case                              Hey guys. Wanted to pick your brain about this patient and his MRCP. He was admitted with pancreatitis thought to be due  to drug reaction which has since been stopped. 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 He's having ongoing pain, was curious to get your thoughts, any role for EUS here? Thanks  Richardson Landry

## 2020-07-02 ENCOUNTER — Ambulatory Visit (AMBULATORY_SURGERY_CENTER): Payer: Self-pay | Admitting: *Deleted

## 2020-07-02 ENCOUNTER — Other Ambulatory Visit: Payer: Self-pay

## 2020-07-02 VITALS — Ht 70.0 in | Wt 192.0 lb

## 2020-07-02 DIAGNOSIS — Z86010 Personal history of colonic polyps: Secondary | ICD-10-CM

## 2020-07-02 MED ORDER — SUTAB 1479-225-188 MG PO TABS: 24.0000 | ORAL_TABLET | ORAL | 0 refills | Status: DC

## 2020-07-02 NOTE — Progress Notes (Signed)
No egg or soy allergy known to patient  No issues with past sedation with any surgeries or procedures no intubation problems in the past  No FH of Malignant Hyperthermia No diet pills per patient No home 02 use per patient  No blood thinners per patient  Pt denies issues with constipation  No A fib or A flutter  EMMI video to pt or via [place] 19 guidelines implemented in PV today with Pt and RN   [place]  Coupon given to pt in PV today , Code to Pharmacy   Due to the COVID-19 pandemic we are asking patients to follow these guidelines. Please only bring one care partner. Please be aware that your care partner may wait in the car in the parking lot or if they feel like they will be too hot to wait in the car, they may wait in the lobby on the 4th floor. All care partners are required to wear a mask the entire time (we do not have any that we can provide them), they need to practice social distancing, and we will do a Covid check for all patient's and care partners when you arrive. Also we will check their temperature and your temperature. If the care partner waits in their car they need to stay in the parking lot the entire time and we will call them on their cell phone when the patient is ready for discharge so they can bring the car to the front of the building. Also all patient's will need to wear a mask into building.

## 2020-07-09 ENCOUNTER — Ambulatory Visit (AMBULATORY_SURGERY_CENTER): Payer: Medicare Other | Admitting: Gastroenterology

## 2020-07-09 ENCOUNTER — Encounter: Payer: Self-pay | Admitting: Gastroenterology

## 2020-07-09 ENCOUNTER — Other Ambulatory Visit: Payer: Self-pay

## 2020-07-09 VITALS — BP 136/91 | HR 70 | Temp 97.8°F | Resp 15 | Ht 70.0 in | Wt 192.0 lb

## 2020-07-09 DIAGNOSIS — D123 Benign neoplasm of transverse colon: Secondary | ICD-10-CM

## 2020-07-09 DIAGNOSIS — D120 Benign neoplasm of cecum: Secondary | ICD-10-CM

## 2020-07-09 DIAGNOSIS — D122 Benign neoplasm of ascending colon: Secondary | ICD-10-CM | POA: Diagnosis not present

## 2020-07-09 DIAGNOSIS — Z86010 Personal history of colonic polyps: Secondary | ICD-10-CM

## 2020-07-09 MED ORDER — SODIUM CHLORIDE 0.9 % IV SOLN: 500.0000 mL | Freq: Once | INTRAVENOUS | Status: DC

## 2020-07-09 NOTE — Patient Instructions (Signed)
.  Handout on polyps, diverticulosis given.    YOU HAD AN ENDOSCOPIC PROCEDURE TODAY AT THE [facility]:   Refer to the procedure report that was given to you for any specific questions about what was found during the examination.  If the procedure report does not answer your questions, please call your gastroenterologist to clarify.  If you requested that your care partner not be given the details of your procedure findings, then the procedure report has been included in a sealed envelope for you to review at your convenience later.  YOU SHOULD EXPECT: Some feelings of bloating in the abdomen. Passage of more gas than usual.  Walking can help get rid of the air that was put into your GI tract during the procedure and reduce the bloating. If you had a lower endoscopy (such as a colonoscopy or flexible sigmoidoscopy) you may notice spotting of blood in your stool or on the toilet paper. If you underwent a bowel prep for your procedure, you may not have a normal bowel movement for a few days.  Please Note:  You might notice some irritation and congestion in your nose or some drainage.  This is from the oxygen used during your procedure.  There is no need for concern and it should clear up in a day or so.  SYMPTOMS TO REPORT IMMEDIATELY:   Following lower endoscopy (colonoscopy or flexible sigmoidoscopy):  Excessive amounts of blood in the stool  Significant tenderness or worsening of abdominal pains  Swelling of the abdomen that is new, acute  Fever of 100F or higher   For urgent or emergent issues, a gastroenterologist can be reached at any hour by calling [phone]. Do not use MyChart messaging for urgent concerns.    DIET:  We do recommend a small meal at first, but then you may proceed to your regular diet.  Drink plenty of fluids but you should avoid alcoholic beverages for 24 hours.  ACTIVITY:  You should plan to take it easy for the rest of today and you should NOT  DRIVE or use heavy machinery until tomorrow (because of the sedation medicines used during the test).    FOLLOW UP: Our staff will call the number listed on your records 48-72 hours following your procedure to check on you and address any questions or concerns that you may have regarding the information given to you following your procedure. If we do not reach you, we will leave a message.  We will attempt to reach you two times.  During this call, we will ask if you have developed any symptoms of COVID 19. If you develop any symptoms (ie: fever, flu-like symptoms, shortness of breath, cough etc.) before then, please call [phone].  If you test positive for Covid 19 in the 2 weeks post procedure, please call and report this information to us.    If any biopsies were taken you will be contacted by phone or by letter within the next 1-3 weeks.  Please call us at [phone] if you have not heard about the biopsies in 3 weeks.    SIGNATURES/CONFIDENTIALITY: You and/or your care partner have signed paperwork which will be entered into your electronic medical record.  These signatures attest to the fact that that the information above on your After Visit Summary has been reviewed and is understood.  Full responsibility of the confidentiality of this discharge information lies with you and/or your care-partner. 

## 2020-07-09 NOTE — Progress Notes (Signed)
Medical history reviewed with no changes noted. VS assessed by C.W 

## 2020-07-09 NOTE — Op Note (Signed)
Chase Hunter Patient Name: Chase Hunter Procedure Date: 07/09/2020 7:31 AM MRN: [id number] Endoscopist: Remo Lipps P. Havery Moros , MD Age: 59 Referring MD:  Date of Birth: 16-Feb-1961 Gender: Male Account #: [id number] Procedure:                Colonoscopy Indications:              High risk colon cancer surveillance: Personal                            history of colonic polyps (adenomas removed in                            2015, one > 45mm in size) Medicines:                Monitored Anesthesia Care Procedure:                Pre-Anesthesia Assessment:                           - Prior to the procedure, a History and Physical                            was performed, and patient medications and                            allergies were reviewed. The patient's tolerance of                            previous anesthesia was also reviewed. The risks                            and benefits of the procedure and the sedation                            options and risks were discussed with the patient.                            All questions were answered, and informed consent                            was obtained. Prior Anticoagulants: The patient has                            taken no previous anticoagulant or antiplatelet                            agents. ASA Grade Assessment: II - A patient with                            mild systemic disease. After reviewing the risks                            and benefits, the patient was deemed in  satisfactory condition to undergo the procedure.                           After obtaining informed consent, the colonoscope                            was passed under direct vision. Throughout the                            procedure, the patient's blood pressure, pulse, and                            oxygen saturations were monitored continuously. The                            Colonoscope was introduced through the  anus and                            advanced to the the cecum, identified by                            appendiceal orifice and ileocecal valve. The                            colonoscopy was performed without difficulty. The                            patient tolerated the procedure well. The quality                            of the bowel preparation was good. The ileocecal                            valve, appendiceal orifice, and rectum were                            photographed. Scope In: 8:44:03 AM Scope Out: 8:59:52 AM Scope Withdrawal Time: 0 hours 12 minutes 59 seconds  Total Procedure Duration: 0 hours 15 minutes 49 seconds  Findings:                 The perianal and digital rectal examinations were                            normal.                           A 4 to 5 mm polyp was found in the cecum. The polyp                            was sessile. The polyp was removed with a cold                            snare. Resection and retrieval were complete.  A 3 mm polyp was found in the ascending colon. The                            polyp was sessile. The polyp was removed with a                            cold snare. Resection and retrieval were complete.                           A 3 mm polyp was found in the transverse colon. The                            polyp was sessile. The polyp was removed with a                            cold snare. Resection and retrieval were complete.                           Scattered small-mouthed diverticula were found in                            the transverse colon and left colon.                           Internal hemorrhoids were found during retroflexion.                           The exam was otherwise without abnormality. Complications:            No immediate complications. Estimated blood loss:                            Minimal. Estimated Blood Loss:     Estimated blood loss was minimal. Impression:                - One 4 to 5 mm polyp in the cecum, removed with a                            cold snare. Resected and retrieved.                           - One 3 mm polyp in the ascending colon, removed                            with a cold snare. Resected and retrieved.                           - One 3 mm polyp in the transverse colon, removed                            with a cold snare. Resected and retrieved.                           -  Diverticulosis in the transverse colon and in the                            left colon.                           - Internal hemorrhoids.                           - The examination was otherwise normal. Recommendation:           - Patient has a contact number available for                            emergencies. The signs and symptoms of potential                            delayed complications were discussed with the                            patient. Return to normal activities tomorrow.                            Written discharge instructions were provided to the                            patient.                           - Resume previous diet.                           - Continue present medications.                           - Await pathology results. Remo Lipps P. Zacary Bauer, MD 07/09/2020 9:04:33 AM This report has been signed electronically.

## 2020-07-09 NOTE — Progress Notes (Signed)
Report to PACU, RN, vss, BBS= Clear.  

## 2020-07-09 NOTE — Progress Notes (Signed)
Called to room to assist during endoscopic procedure.  Patient ID and intended procedure confirmed with present staff. Received instructions for my participation in the procedure from the performing physician.  

## 2020-07-10 ENCOUNTER — Telehealth: Payer: Self-pay

## 2020-07-10 NOTE — Telephone Encounter (Signed)
-----   Message from Milus Banister, MD sent at 07/10/2020  7:19 AM EST ----- Pat Sires, Looks like I now have availability to move his case sooner.  Can you please see if he can switch to Thursday, November 18 (after my last case) instead of December 2.  I know he was hoping to get this done as soon as possible.  Thanks  Ardine Bjork

## 2020-07-10 NOTE — Telephone Encounter (Signed)
The pt has agreed to move the case. I have sent him new instructions to My Chart per his request.

## 2020-07-11 ENCOUNTER — Telehealth: Payer: Self-pay | Admitting: *Deleted

## 2020-07-11 NOTE — Telephone Encounter (Signed)
  Follow up Call-  Call back number 07/09/2020  Post procedure Call Back phone  # [phone]  Permission to leave phone message Yes     First attempt for follow up phone call. No answer at number given.  Left message on voicemail.

## 2020-07-11 NOTE — Telephone Encounter (Signed)
  Follow up Call-  Call back number 07/09/2020  Post procedure Call Back phone  # [phone]  Permission to leave phone message Yes     Patient questions:  Do you have a fever, pain , or abdominal swelling? No. Pain Score  0 *  Have you tolerated food without any problems? Yes.    Have you been able to return to your normal activities? Yes.    Do you have any questions about your discharge instructions: Diet   No. Medications  No. Follow up visit  No.  Do you have questions or concerns about your Care? No.  Actions: * If pain score is 4 or above: No action needed, pain <4.  1. Have you developed a fever since your procedure? no  2.   Have you had an respiratory symptoms (SOB or cough) since your procedure? no  3.   Have you tested positive for COVID 19 since your procedure no  4.   Have you had any family members/close contacts diagnosed with the COVID 19 since your procedure?  no   If yes to any of these questions please route to Joylene John, RN and Joella Prince, RN

## 2020-07-16 ENCOUNTER — Other Ambulatory Visit (HOSPITAL_COMMUNITY)
Admission: RE | Admit: 2020-07-16 | Discharge: 2020-07-16 | Disposition: A | Discharge status: Home / Self Care | Payer: Medicare Other | Source: Ambulatory Visit | Attending: Gastroenterology | Admitting: Gastroenterology

## 2020-07-16 DIAGNOSIS — Z01812 Encounter for preprocedural laboratory examination: Secondary | ICD-10-CM | POA: Insufficient documentation

## 2020-07-16 DIAGNOSIS — Z20822 Contact with and (suspected) exposure to covid-19: Secondary | ICD-10-CM | POA: Insufficient documentation

## 2020-07-16 LAB — SARS CORONAVIRUS 2 (TAT 6-24 HRS): SARS Coronavirus 2: NEGATIVE

## 2020-07-18 ENCOUNTER — Ambulatory Visit (HOSPITAL_COMMUNITY): Payer: Medicare Other

## 2020-07-19 ENCOUNTER — Ambulatory Visit (HOSPITAL_COMMUNITY): Payer: Medicare Other | Admitting: Anesthesiology

## 2020-07-19 ENCOUNTER — Encounter (HOSPITAL_COMMUNITY): Payer: Self-pay | Admitting: Gastroenterology

## 2020-07-19 ENCOUNTER — Other Ambulatory Visit: Payer: Self-pay

## 2020-07-19 ENCOUNTER — Ambulatory Visit (HOSPITAL_COMMUNITY)
Admission: RE | Admit: 2020-07-19 | Discharge: 2020-07-19 | Disposition: A | Discharge status: Home / Self Care | Payer: Medicare Other | Attending: Gastroenterology | Admitting: Gastroenterology

## 2020-07-19 ENCOUNTER — Encounter (HOSPITAL_COMMUNITY)
Admission: RE | Disposition: A | Discharge status: Home / Self Care | Payer: Self-pay | Source: Home / Self Care | Attending: Gastroenterology

## 2020-07-19 DIAGNOSIS — K859 Acute pancreatitis without necrosis or infection, unspecified: Secondary | ICD-10-CM

## 2020-07-19 DIAGNOSIS — Z881 Allergy status to other antibiotic agents status: Secondary | ICD-10-CM | POA: Insufficient documentation

## 2020-07-19 DIAGNOSIS — Z888 Allergy status to other drugs, medicaments and biological substances status: Secondary | ICD-10-CM | POA: Insufficient documentation

## 2020-07-19 DIAGNOSIS — R933 Abnormal findings on diagnostic imaging of other parts of digestive tract: Secondary | ICD-10-CM

## 2020-07-19 DIAGNOSIS — K8689 Other specified diseases of pancreas: Secondary | ICD-10-CM | POA: Diagnosis not present

## 2020-07-19 DIAGNOSIS — R109 Unspecified abdominal pain: Secondary | ICD-10-CM

## 2020-07-19 HISTORY — PX: EUS: SHX5427

## 2020-07-19 HISTORY — PX: ESOPHAGOGASTRODUODENOSCOPY: SHX5428

## 2020-07-19 SURGERY — UPPER ENDOSCOPIC ULTRASOUND (EUS) RADIAL
Anesthesia: Monitor Anesthesia Care

## 2020-07-19 MED ORDER — PROPOFOL 10 MG/ML IV BOLUS
INTRAVENOUS | Status: DC | PRN
  Administered 2020-07-19 (×7): 20 mg via INTRAVENOUS

## 2020-07-19 MED ORDER — LACTATED RINGERS IV SOLN: INTRAVENOUS | Status: DC | PRN

## 2020-07-19 MED ORDER — SODIUM CHLORIDE 0.9 % IV SOLN: INTRAVENOUS | Status: DC

## 2020-07-19 MED ORDER — LACTATED RINGERS IV SOLN: INTRAVENOUS | Status: DC

## 2020-07-19 MED ORDER — LIDOCAINE 2% (20 MG/ML) 5 ML SYRINGE
INTRAMUSCULAR | Status: DC | PRN
  Administered 2020-07-19: 60 mg via INTRAVENOUS

## 2020-07-19 MED ORDER — PROPOFOL 500 MG/50ML IV EMUL
INTRAVENOUS | Status: DC | PRN
  Administered 2020-07-19: 180 ug/kg/min via INTRAVENOUS

## 2020-07-19 NOTE — H&P (Signed)
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  Constitutional:  No unintentional weight loss   Past Medical History:  Diagnosis Date  . Allergy   . Anemia   . Asthma   . Bipolar II disorder ([place])   . Depression   . Gallstones   . GERD (gastroesophageal reflux disease)   . Hx of adenomatous colonic polyps   . Hyperlipidemia   . Hypertension   . Pancreatitis   . Seizures ([place])    past hx 6 yrs ago x 1- coming off meds caused 1 seizure     Past Surgical History:  Procedure Laterality Date  . COLONOSCOPY    . POLYPECTOMY      Current Facility-Administered Medications  Medication Dose Route Frequency Provider Last Rate Last Admin  . lactated ringers infusion   Intravenous Continuous Milus Banister, MD 10 mL/hr at 07/19/20 1018 New Bag at 07/19/20 1018    Allergies as of 06/27/2020 - Review Complete 06/13/2020  Allergen Reaction Noted  . Cephalexin Hives 02/01/2014  . Lipitor [atorvastatin] Rash 04/19/2014    Family History  Problem Relation Age of Onset  . Atrial fibrillation Mother   . Prostate cancer Father   . Brain cancer Father   . Colon cancer Neg Hx   . Colon polyps Neg Hx   . Esophageal cancer Neg Hx   . Rectal cancer Neg Hx   . Stomach cancer Neg Hx     Social History   Socioeconomic History  . Marital status: Married    Spouse name: Not on file  . Number of children: 1  . Years of education: Not on file  . Highest education level: Not on file  Occupational History  . Occupation: retired Pharmacist, hospital  Tobacco Use  . Smoking status: Never Smoker  . Smokeless tobacco: Never Used  Substance and Sexual Activity  . Alcohol use: Never  . Drug use: Never  . Sexual activity: Not on file  Other Topics Concern  . Not on file  Social History Narrative  . Not on file   Social Determinants of Health   Financial Resource Strain:   . Difficulty of  Paying Living Expenses: Not on file  Food Insecurity:   . Worried About Charity fundraiser in the Last Year: Not on file  . Ran Out of Food in the Last Year: Not on file  Transportation Needs:   . Lack of Transportation (Medical): Not on file  . Lack of Transportation (Non-Medical): Not on file  Physical Activity:   . Days of Exercise per Week: Not on file  . Minutes of Exercise per Session: Not on file  Stress:   . Feeling of Stress : Not on file  Social Connections:   . Frequency of Communication with Friends and Family: Not on file  . Frequency of Social Gatherings with Friends and Family: Not on file  . Attends Religious Services: Not on file  . Active Member of Clubs or Organizations: Not on file  . Attends Archivist Meetings: Not on file  . Marital Status: Not on file  Intimate Partner Violence:   . Fear of Current or Ex-Partner: Not on file  . Emotionally Abused: Not on file  . Physically Abused: Not on file  . Sexually Abused: Not on file     Physical Exam: BP (!) 139/102   Pulse 69  Temp 98.1 F (36.7 C) (Oral)   Resp 14   Ht 5\' 10"  (1.778 m)   Wt 85.3 kg   SpO2 98%   BMI 26.98 kg/m  Constitutional: generally well-appearing Psychiatric: alert and oriented x3 Abdomen: soft, nontender, nondistended, no obvious ascites, no peritoneal signs, normal bowel sounds No peripheral edema noted in lower extremities  Assessment and plan: 59 y.o. 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  Owens Loffler, MD [facility] 07/19/2020, 11:27 AM

## 2020-07-19 NOTE — Op Note (Signed)
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 Estimated blood loss:                            None. Estimated Blood Loss:     Estimated blood loss: none. Procedure:                Pre-Anesthesia Assessment:                           - Prior to the procedure, a History and Physical                            was performed, and patient medications and                            allergies were reviewed. The patient's tolerance of                            previous anesthesia was also reviewed. The risks                            and benefits of the procedure and the sedation  options and risks were discussed with the patient.                            All questions were answered, and informed consent                            was obtained. Prior Anticoagulants: The patient has                            taken no previous anticoagulant or antiplatelet                            agents. ASA Grade Assessment: II - A patient with                             mild systemic disease. After reviewing the risks                            and benefits, the patient was deemed in                            satisfactory condition to undergo the procedure.                           After obtaining informed consent, the endoscope was                            passed under direct vision. Throughout the                            procedure, the patient's blood pressure, pulse, and                            oxygen saturations were monitored continuously. The                            [id number] ([id number]) Olympus was introduced through                            the mouth, and advanced to the second part of                            duodenum. The upper EUS was accomplished without                            difficulty. The patient tolerated the procedure                            well. Scope In: Scope Out: Findings:      ENDOSCOPIC FINDING: :      The examined esophagus was endoscopically normal.      The entire examined stomach was endoscopically normal.      The examined duodenum was endoscopically normal.      ENDOSONOGRAPHIC FINDING: :  1. There was a 2.5cm 'lesion' that appears to be directly abutting the       pancreatic tail but not arise from the pancreas. The lesion is       hypoechoic, homogeneous, well circumsribed and lays adjacent to the the       splenic artery and very near the splenic hilum. The spleen parenchyma       appears very similar to the lesion.      2. The pancreatic parechyma is lobular with multiple echogenic strands       and somewhat of a 'honeycombed' appearance. The main pancreatic duct has       echogenic walls and is non dilated.      3. No peripancreatic adenopathy.      4. CBD was normal, non-dilated.      5. Limited views of the liver were all normal. Impression:               - I agree with MRI radiologist that the 'lesion'                            near the tail of pancreas is consistent with a                             splenule. I do not think it requires any further                            imaging unless new symptoms arise.                           - Pancreatic parenchymal and ductal changes                            consistent with chronic pancreatitis; possibly from                            remote significant alcohol abuse. These changes                            shed some doubt on the idea that his depakote was                            the cause of him acute pancreatitis. That episode                            may have just been 'acute on chronic' pancreatitis.                            If depakote has been a very helpful medicine for                            him in the past I think a retrial of it is                            reasonable and I've asked him to bring this up with  the prescribing MD. Moderate Sedation:      Not Applicable - Patient had care per Anesthesia. Recommendation:           - Discharge patient to home (ambulatory). Procedure Code(s):        --- Professional ---                           [id number], Esophagogastroduodenoscopy, flexible,                            transoral; with endoscopic ultrasound examination                            limited to the esophagus, stomach or duodenum, and                            adjacent structures Diagnosis Code(s):        --- Professional ---                           R93.3, Abnormal findings on diagnostic imaging of                            other parts of digestive tract CPT copyright 2019 American Medical Association. All rights reserved. The codes documented in this report are preliminary and upon coder review may  be revised to meet current compliance requirements. Milus Banister, MD 07/19/2020 12:26:54 PM This report has been signed electronically. Number of Addenda: 0

## 2020-07-19 NOTE — Anesthesia Preprocedure Evaluation (Signed)
Anesthesia Evaluation  Patient identified by MRN, date of birth, ID band Patient awake    Reviewed: Allergy & Precautions, NPO status , Patient's Chart, lab work & pertinent test results  Airway Mallampati: I       Dental no notable dental hx.    Pulmonary asthma ,    Pulmonary exam normal        Cardiovascular hypertension, Normal cardiovascular exam     Neuro/Psych PSYCHIATRIC DISORDERS Depression Bipolar Disorder    GI/Hepatic   Endo/Other  diabetes, Insulin Dependent  Renal/GU   negative genitourinary   Musculoskeletal   Abdominal Normal abdominal exam  (+)   Peds  Hematology   Anesthesia Other Findings   Reproductive/Obstetrics                             Anesthesia Physical Anesthesia Plan  ASA: II  Anesthesia Plan: MAC   Post-op Pain Management:    Induction:   PONV Risk Score and Plan: Propofol infusion  Airway Management Planned: Natural Airway, Nasal Cannula and Mask  Additional Equipment: None  Intra-op Plan:   Post-operative Plan:   Informed Consent: I have reviewed the patients History and Physical, chart, labs and discussed the procedure including the risks, benefits and alternatives for the proposed anesthesia with the patient or authorized representative who has indicated his/her understanding and acceptance.       Plan Discussed with: CRNA  Anesthesia Plan Comments:         Anesthesia Quick Evaluation

## 2020-07-19 NOTE — Transfer of Care (Signed)
Immediate Anesthesia Transfer of Care Note  Patient: Chase Hunter.  Procedure(s) Performed: UPPER ENDOSCOPIC ULTRASOUND (EUS) RADIAL (N/A ) ESOPHAGOGASTRODUODENOSCOPY (EGD) (N/A )  Patient Location: PACU  Anesthesia Type:MAC  Level of Consciousness: sedated  Airway & Oxygen Therapy: Patient Spontanous Breathing and Patient connected to face mask oxygen  Post-op Assessment: Report given to RN and Post -op Vital signs reviewed and stable  Post vital signs: Reviewed and stable  Last Vitals:  Vitals Value Taken Time  BP    Temp    Pulse 85 07/19/20 1211  Resp 17 07/19/20 1211  SpO2 100 % 07/19/20 1211  Vitals shown include unvalidated device data.  Last Pain:  Vitals:   07/19/20 1004  TempSrc: Oral  PainSc: 0-No pain         Complications: No complications documented.

## 2020-07-19 NOTE — Discharge Instructions (Signed)
YOU HAD AN ENDOSCOPIC PROCEDURE TODAY: Refer to the procedure report and other information in the discharge instructions given to you for any specific questions about what was found during the examination. If this information does not answer your questions, please call [place] office at [phone] to clarify.  ° °YOU SHOULD EXPECT: Some feelings of bloating in the abdomen. Passage of more gas than usual. Walking can help get rid of the air that was put into your GI tract during the procedure and reduce the bloating. If you had a lower endoscopy (such as a colonoscopy or flexible sigmoidoscopy) you may notice spotting of blood in your stool or on the toilet paper. Some abdominal soreness may be present for a day or two, also. ° °DIET: Your first meal following the procedure should be a light meal and then it is ok to progress to your normal diet. A half-sandwich or bowl of soup is an example of a good first meal. Heavy or fried foods are harder to digest and may make you feel nauseous or bloated. Drink plenty of fluids but you should avoid alcoholic beverages for 24 hours. If you had a esophageal dilation, please see attached instructions for diet.   ° °ACTIVITY: Your care partner should take you home directly after the procedure. You should plan to take it easy, moving slowly for the rest of the day. You can resume normal activity the day after the procedure however YOU SHOULD NOT DRIVE, use power tools, machinery or perform tasks that involve climbing or major physical exertion for 24 hours (because of the sedation medicines used during the test).  ° °SYMPTOMS TO REPORT IMMEDIATELY: °A gastroenterologist can be reached at any hour. Please call [phone]  for any of the following symptoms:  °Following lower endoscopy (colonoscopy, flexible sigmoidoscopy) °Excessive amounts of blood in the stool  °Significant tenderness, worsening of abdominal pains  °Swelling of the abdomen that is new, acute  °Fever of 100° or  higher  °Following upper endoscopy (EGD, EUS, ERCP, esophageal dilation) °Vomiting of blood or coffee ground material  °New, significant abdominal pain  °New, significant chest pain or pain under the shoulder blades  °Painful or persistently difficult swallowing  °New shortness of breath  °Black, tarry-looking or red, bloody stools ° °FOLLOW UP:  °If any biopsies were taken you will be contacted by phone or by letter within the next 1-3 weeks. Call [phone]  if you have not heard about the biopsies in 3 weeks.  °Please also call with any specific questions about appointments or follow up tests. ° °

## 2020-07-23 ENCOUNTER — Encounter (HOSPITAL_COMMUNITY): Payer: Self-pay | Admitting: Gastroenterology

## 2020-07-25 NOTE — Anesthesia Postprocedure Evaluation (Addendum)
Anesthesia Post Note  Patient: Chase Hunter.  Procedure(s) Performed: UPPER ENDOSCOPIC ULTRASOUND (EUS) RADIAL (N/A ) ESOPHAGOGASTRODUODENOSCOPY (EGD) (N/A )     Patient location during evaluation: Endoscopy Anesthesia Type: MAC Level of consciousness: awake Pain management: pain level controlled Vital Signs Assessment: post-procedure vital signs reviewed and stable Respiratory status: spontaneous breathing Cardiovascular status: stable Postop Assessment: no apparent nausea or vomiting Anesthetic complications: no   No complications documented.  Last Vitals:  Vitals:   07/19/20 1220 07/19/20 1230  BP: (!) 142/88 (!) 133/104  Pulse: 80 73  Resp: 17 11  Temp:    SpO2: 99% 96%    Last Pain:  Vitals:   07/19/20 1230  TempSrc:   PainSc: 0-No pain                 Huston Foley

## 2020-07-30 ENCOUNTER — Other Ambulatory Visit (HOSPITAL_COMMUNITY): Payer: Medicare Other

## 2020-08-01 ENCOUNTER — Ambulatory Visit: Payer: Medicare Other | Admitting: Gastroenterology

## 2020-08-16 ENCOUNTER — Other Ambulatory Visit (HOSPITAL_COMMUNITY): Payer: Medicare Other

## 2020-08-20 ENCOUNTER — Ambulatory Visit (HOSPITAL_COMMUNITY): Admit: 2020-08-20 | Payer: Medicare Other | Admitting: Gastroenterology

## 2020-08-20 ENCOUNTER — Encounter (HOSPITAL_COMMUNITY): Payer: Self-pay

## 2020-08-20 SURGERY — UPPER ENDOSCOPIC ULTRASOUND (EUS) RADIAL
Anesthesia: Monitor Anesthesia Care

## 2020-10-12 ENCOUNTER — Other Ambulatory Visit: Payer: Self-pay | Admitting: Family Medicine

## 2020-10-12 DIAGNOSIS — E291 Testicular hypofunction: Secondary | ICD-10-CM

## 2020-10-12 NOTE — Telephone Encounter (Signed)
Last filled 09/05/20

## 2020-10-17 ENCOUNTER — Other Ambulatory Visit: Payer: Self-pay | Admitting: Family Medicine

## 2020-10-17 DIAGNOSIS — I10 Essential (primary) hypertension: Secondary | ICD-10-CM

## 2020-10-19 ENCOUNTER — Encounter: Payer: Self-pay | Admitting: Family Medicine

## 2020-10-22 ENCOUNTER — Other Ambulatory Visit: Payer: Self-pay | Admitting: Family Medicine

## 2020-11-29 ENCOUNTER — Other Ambulatory Visit: Payer: Self-pay

## 2020-11-30 ENCOUNTER — Other Ambulatory Visit: Payer: Self-pay

## 2020-11-30 ENCOUNTER — Encounter: Payer: Self-pay | Admitting: Family Medicine

## 2020-11-30 ENCOUNTER — Ambulatory Visit (INDEPENDENT_AMBULATORY_CARE_PROVIDER_SITE_OTHER): Payer: Medicare Other | Admitting: Family Medicine

## 2020-11-30 VITALS — BP 140/98 | HR 128 | Temp 98.2°F | Resp 16 | Ht 70.0 in | Wt 185.4 lb

## 2020-11-30 DIAGNOSIS — F3181 Bipolar II disorder: Secondary | ICD-10-CM

## 2020-11-30 DIAGNOSIS — E785 Hyperlipidemia, unspecified: Secondary | ICD-10-CM

## 2020-11-30 DIAGNOSIS — I10 Essential (primary) hypertension: Secondary | ICD-10-CM | POA: Diagnosis not present

## 2020-11-30 DIAGNOSIS — E291 Testicular hypofunction: Secondary | ICD-10-CM

## 2020-11-30 DIAGNOSIS — R000 Tachycardia, unspecified: Secondary | ICD-10-CM | POA: Diagnosis not present

## 2020-11-30 LAB — CBC WITH DIFFERENTIAL/PLATELET
Basophils Absolute: 0 10*3/uL (ref 0.0–0.1)
Basophils Relative: 0.7 % (ref 0.0–3.0)
Eosinophils Absolute: 0.1 10*3/uL (ref 0.0–0.7)
Eosinophils Relative: 2.2 % (ref 0.0–5.0)
HCT: 45.7 % (ref 39.0–52.0)
Hemoglobin: 15.3 g/dL (ref 13.0–17.0)
Lymphocytes Relative: 27 % (ref 12.0–46.0)
Lymphs Abs: 1.7 10*3/uL (ref 0.7–4.0)
MCHC: 33.6 g/dL (ref 30.0–36.0)
MCV: 88.5 fl (ref 78.0–100.0)
Monocytes Absolute: 0.4 10*3/uL (ref 0.1–1.0)
Monocytes Relative: 6.1 % (ref 3.0–12.0)
Neutro Abs: 4 10*3/uL (ref 1.4–7.7)
Neutrophils Relative %: 64 % (ref 43.0–77.0)
Platelets: 163 10*3/uL (ref 150.0–400.0)
RBC: 5.16 Mil/uL (ref 4.22–5.81)
RDW: 13.4 % (ref 11.5–15.5)
WBC: 6.3 10*3/uL (ref 4.0–10.5)

## 2020-11-30 LAB — LIPID PANEL
Cholesterol: 294 mg/dL — ABNORMAL HIGH (ref 0–200)
HDL: 47 mg/dL (ref >39.00)
LDL Cholesterol: 212 mg/dL — ABNORMAL HIGH (ref 0–99)
NonHDL: 247.37
Total CHOL/HDL Ratio: 6
Triglycerides: 178 mg/dL — ABNORMAL HIGH (ref 0.0–149.0)
VLDL: 35.6 mg/dL (ref 0.0–40.0)

## 2020-11-30 LAB — BASIC METABOLIC PANEL
BUN: 17 mg/dL (ref 6–23)
CO2: 28 mEq/L (ref 19–32)
Calcium: 9.3 mg/dL (ref 8.4–10.5)
Chloride: 101 mEq/L (ref 96–112)
Creatinine, Ser: 1.05 mg/dL (ref 0.40–1.50)
GFR: 77.75 mL/min (ref >60.00)
Glucose, Bld: 96 mg/dL (ref 70–99)
Potassium: 4.3 mEq/L (ref 3.5–5.1)
Sodium: 139 mEq/L (ref 135–145)

## 2020-11-30 LAB — HEPATIC FUNCTION PANEL
ALT: 33 U/L (ref 0–53)
AST: 24 U/L (ref 0–37)
Albumin: 4.8 g/dL (ref 3.5–5.2)
Alkaline Phosphatase: 55 U/L (ref 39–117)
Bilirubin, Direct: 0.1 mg/dL (ref 0.0–0.3)
Total Bilirubin: 0.6 mg/dL (ref 0.2–1.2)
Total Protein: 7.3 g/dL (ref 6.0–8.3)

## 2020-11-30 LAB — TSH: TSH: 1.19 u[IU]/mL (ref 0.35–4.50)

## 2020-11-30 LAB — TESTOSTERONE: Testosterone: 57.63 ng/dL — ABNORMAL LOW (ref 300.00–890.00)

## 2020-11-30 MED ORDER — TESTOSTERONE CYPIONATE 200 MG/ML IM SOLN: 100.0000 mg | INTRAMUSCULAR | 3 refills | Status: DC

## 2020-11-30 MED ORDER — CARVEDILOL 3.125 MG PO TABS: 3.1250 mg | ORAL_TABLET | Freq: Two times a day (BID) | ORAL | 1 refills | Status: DC

## 2020-11-30 NOTE — Assessment & Plan Note (Addendum)
BP is not adequately controlled. Carvedilol dose increased from 3.125 mg q 2 days to bid. Continue Lisinopril 40 mg daily. Continue monitoring BP regularly. Low salt diet.

## 2020-11-30 NOTE — Assessment & Plan Note (Addendum)
We discussed prior testosterone results and recommendations.He states that he did not receive results. We reviewed some side effects of testosterone, including increased CV risk. No changes in testosterone dose for now, new recommendations will be given according to lab results.

## 2020-11-30 NOTE — Assessment & Plan Note (Signed)
Chronic problem and asymptomatic. Carvedilol dose increased, some side effects discussed. Instructed to monitor HR at home and increase fluid intake. Further recommendations according to lab results. Instructed about warning signs.

## 2020-11-30 NOTE — Progress Notes (Signed)
HPI: [name] is a 60 y.o. male, who is here today for 5 months follow up.   He was last seen on 06/13/20 for his AWV.  Since his last visit he has seen his psychiatrist and new medication has been added, Duloxetine 60 mg daily. He sees his psychiatrist q4- 6 weeks and therapist q 2 weeks. Sleeping about 8-9 hours.  Hypogonadism: He has been on testosterone replacement for years. He feels like medication is helping, not specific about symptoms before testosterone was started. States that he has not had ED but is having difficulty "finishing" since Duloxetine was started. He is planning on addressing this problem with his psychiatrist.  Lab Results  Component Value Date   ALT 19 06/12/2020   AST 14 06/12/2020   ALKPHOS 48 06/12/2020   BILITOT 0.6 06/12/2020   Lab Results  Component Value Date   PSA 1.63 06/13/2020   In 06/2020 testosterone frequency was changed from 100 mg q 7 days to q 10 days because elevated testosterone but he is still taking it every week. He has not had testosterone in the past 2-3 weeks.  Lab Results  Component Value Date   TESTOSTERONE 867 (H) 06/13/2020   Nocturia x 1, no more than usual.  HTN: He is taking Carvedilol 6.25 mg bid q 2 days. He is also on Lisinopril 40 mg daily.  130-140's/90-100. Noted tachycardia today. This is not a new problem, he has had EKG done before, last one in 05/2020: Diagnosis Sinus tachycardia  normal axis  Nonspecific T wave abnormality  Abnormal ECG  When compared with ECG of 02-May-2020 13:31,  No significant change was found   Negative severe/frequent headache, visual changes, chest pain, dyspnea, palpitation, claudication, focal weakness, or edema.  Lab Results  Component Value Date   CREATININE 1.02 06/12/2020   BUN 19 06/12/2020   NA 139 06/12/2020   K 4.0 06/12/2020   CL 102 06/12/2020   CO2 27 06/12/2020   He is not on cholesterol medication, states that statins causes  confusion and forgetfulness.  He has tried several statis. He is following a low fat diet.  Lab Results  Component Value Date   CHOL 213 (H) 05/14/2020   HDL 37 (L) 05/14/2020   LDLCALC 145 (H) 05/14/2020   LDLDIRECT 176.0 07/15/2019   TRIG 177 (H) 05/14/2020   CHOLHDL 5.8 (H) 05/14/2020   Review of Systems  Constitutional: Negative for activity change, appetite change, fatigue and fever.  HENT: Negative for mouth sores, nosebleeds and sore throat.   Respiratory: Negative for cough and wheezing.   Gastrointestinal: Negative for abdominal pain, nausea and vomiting.  Genitourinary: Negative for decreased urine volume, difficulty urinating, dysuria and hematuria.  Musculoskeletal: Negative for gait problem and myalgias.  Skin: Negative for pallor and rash.  Allergic/Immunologic: Positive for environmental allergies.  Neurological: Negative for syncope, facial asymmetry and weakness.  Rest of ROS, see pertinent positives sand negatives in HPI  Current Outpatient Medications on File Prior to Visit  Medication Sig Dispense Refill  . albuterol (PROVENTIL) (2.5 MG/3ML) 0.083% nebulizer solution Take 2.5 mg by nebulization every 6 (six) hours as needed for wheezing or shortness of breath.     Marland Kitchen albuterol (VENTOLIN HFA) 108 (90 Base) MCG/ACT inhaler Inhale 2 puffs into the lungs every 6 (six) hours as needed for wheezing or shortness of breath.     . Cholecalciferol (VITAMIN D3) 125 MCG (5000 UT) TABS Take 5,000 Units by mouth daily.     Marland Kitchen  ciclopirox (LOPROX) 0.77 % SUSP Apply 1 application topically daily. On affected nail    . INSULIN SYRINGE 1CC/29G (EXEL COMFORT POINT INSULIN SYR) 29G X 1/2" 1 ML MISC Inject testosterone 0.5 ml weekly 100 each 2  . L-Methylfolate-Algae (DEPLIN 15 PO) Take 15 mg by mouth daily.    Marland Kitchen lisinopril (ZESTRIL) 40 MG tablet TAKE 1 TABLET BY MOUTH EVERY DAY 90 tablet 1  . Omega-3 Fatty Acids (FISH OIL) 1200 MG CAPS Take 1,200 mg by mouth daily.     . QUEtiapine  (SEROQUEL) 300 MG tablet Take 300 mg by mouth at bedtime.     No current facility-administered medications on file prior to visit.   Past Medical History:  Diagnosis Date  . Allergy   . Anemia   . Asthma   . Bipolar II disorder ([place])   . Depression   . Gallstones   . GERD (gastroesophageal reflux disease)   . Hx of adenomatous colonic polyps   . Hyperlipidemia   . Hypertension   . Pancreatitis   . Seizures ([place])    past hx 6 yrs ago x 1- coming off meds caused 1 seizure    Allergies  Allergen Reactions  . Cephalexin Hives  . Lipitor [Atorvastatin] Rash    Other reaction(s): Other (See Comments) Memory issues that have not resolved    Social History   Socioeconomic History  . Marital status: Married    Spouse name: Not on file  . Number of children: 1  . Years of education: Not on file  . Highest education level: Not on file  Occupational History  . Occupation: retired Pharmacist, hospital  Tobacco Use  . Smoking status: Never Smoker  . Smokeless tobacco: Never Used  Substance and Sexual Activity  . Alcohol use: Never  . Drug use: Never  . Sexual activity: Not on file  Other Topics Concern  . Not on file  Social History Narrative  . Not on file   Social Determinants of Health   Financial Resource Strain: Not on file  Food Insecurity: Not on file  Transportation Needs: Not on file  Physical Activity: Not on file  Stress: Not on file  Social Connections: Not on file   Vitals:   11/30/20 0701  BP: (!) 140/98  Pulse: (!) 128  Resp: 16  Temp: 98.2 F (36.8 C)  SpO2: 98%   Body mass index is 26.6 kg/m.  Physical Exam Vitals and nursing note reviewed.  Constitutional:      General: He is not in acute distress.    Appearance: He is well-developed.  HENT:     Head: Normocephalic and atraumatic.     Mouth/Throat:     Mouth: Mucous membranes are moist.     Pharynx: Oropharynx is clear.  Eyes:     Conjunctiva/sclera: Conjunctivae normal.  Cardiovascular:      Rate and Rhythm: Regular rhythm. Tachycardia present.     Pulses:          Dorsalis pedis pulses are 2+ on the right side and 2+ on the left side.     Heart sounds: No murmur heard.     Comments: HR 116/min. Pulmonary:     Effort: Pulmonary effort is normal. No respiratory distress.     Breath sounds: Normal breath sounds.  Abdominal:     Palpations: Abdomen is soft. There is no hepatomegaly or mass.     Tenderness: There is no abdominal tenderness.  Lymphadenopathy:     Cervical: No cervical  adenopathy.  Skin:    General: Skin is warm.     Findings: No erythema or rash.  Neurological:     Mental Status: He is alert and oriented to person, place, and time.     Cranial Nerves: No cranial nerve deficit.     Gait: Gait normal.   ASSESSMENT AND PLAN:  Mr. Chase Hunter. was seen today for 6 months follow-up.  Orders Placed This Encounter  Procedures  . CBC with Differential/Platelet  . TSH  . Hepatic function panel  . Basic metabolic panel  . Lipid panel  . Testosterone   Lab Results  Component Value Date   CHOL 294 (H) 11/30/2020   HDL 47.00 11/30/2020   LDLCALC 212 (H) 11/30/2020   LDLDIRECT 176.0 07/15/2019   TRIG 178.0 (H) 11/30/2020   CHOLHDL 6 11/30/2020    Lab Results  Component Value Date   TSH 1.19 11/30/2020   Lab Results  Component Value Date   TESTOSTERONE 57.63 (L) 11/30/2020   Lab Results  Component Value Date   WBC 6.3 11/30/2020   HGB 15.3 11/30/2020   HCT 45.7 11/30/2020   MCV 88.5 11/30/2020   PLT 163.0 11/30/2020   Lab Results  Component Value Date   CREATININE 1.05 11/30/2020   BUN 17 11/30/2020   NA 139 11/30/2020   K 4.3 11/30/2020   CL 101 11/30/2020   CO2 28 11/30/2020   Lab Results  Component Value Date   ALT 33 11/30/2020   AST 24 11/30/2020   ALKPHOS 55 11/30/2020   BILITOT 0.6 11/30/2020    Hypertension, essential, benign BP is not adequately controlled. Carvedilol dose increased from 3.125 mg q 2 days to  bid. Continue Lisinopril 40 mg daily. Continue monitoring BP regularly. Low salt diet.  Hypogonadism in male We discussed prior testosterone results and recommendations.He states that he did not receive results. We reviewed some side effects of testosterone, including increased CV risk. No changes in testosterone dose for now, new recommendations will be given according to lab results.  Hyperlipidemia He has not tolerated statins in the past. Continue non pharmacologic treatment. We could consider fenofibrates or Zetia depending on FLP numbers.  Bipolar II disorder ([place]) Reported as stable. Following with psychiatrist.  Sinus tachycardia Chronic problem and asymptomatic. Carvedilol dose increased, some side effects discussed. Instructed to monitor HR at home and increase fluid intake. Further recommendations according to lab results. Instructed about warning signs.  Spent 43 minutes.  During this time history was obtained, examination was performed, prior labs reviewed, assessment/plan discussed,and documentation completed.  Return in about 4 months (around 04/01/2021) for HTN,testosterone.   Keyah Blizard G. Martinique, MD  [facility]. [place] office.    A few things to remember from today's visit:   Hypogonadism in male - Plan: CBC with Differential/Platelet, Hepatic function panel, Testosterone  Hypertension, essential, benign - Plan: TSH, Basic metabolic panel, carvedilol (COREG) 3.125 MG tablet  Hyperlipidemia, unspecified hyperlipidemia type - Plan: Lipid panel  Bipolar II disorder ([place]), Chronic  Sinus tachycardia - Plan: TSH  If you need refills please call your pharmacy. Do not use My Chart to request refills or for acute issues that need immediate attention.   Increase dose of Carvedilol from 3.25 mg 1/2 tab every other day to 1 tab 2 times daily. No changes in Lisinopril. Monitor blood pressure and pulse at home.  No changes in rest of  medications.  Please be sure medication list is accurate. If a new  problem present, please set up appointment sooner than planned today.

## 2020-11-30 NOTE — Patient Instructions (Addendum)
  A few things to remember from today's visit:   Hypogonadism in male - Plan: CBC with Differential/Platelet, Hepatic function panel, Testosterone  Hypertension, essential, benign - Plan: TSH, Basic metabolic panel, carvedilol (COREG) 3.125 MG tablet  Hyperlipidemia, unspecified hyperlipidemia type - Plan: Lipid panel  Bipolar II disorder ([place]), Chronic  Sinus tachycardia - Plan: TSH  If you need refills please call your pharmacy. Do not use My Chart to request refills or for acute issues that need immediate attention.   Increase dose of Carvedilol from 3.25 mg 1/2 tab every other day to 1 tab 2 times daily. No changes in Lisinopril. Monitor blood pressure and pulse at home.  No changes in rest of medications.  Please be sure medication list is accurate. If a new problem present, please set up appointment sooner than planned today.

## 2020-11-30 NOTE — Assessment & Plan Note (Signed)
Reported as stable. Following with psychiatrist.

## 2020-11-30 NOTE — Assessment & Plan Note (Addendum)
He has not tolerated statins in the past. Continue non pharmacologic treatment. We could consider fenofibrates or Zetia depending on FLP numbers.

## 2020-12-01 ENCOUNTER — Other Ambulatory Visit: Payer: Self-pay | Admitting: Family Medicine

## 2020-12-01 DIAGNOSIS — E291 Testicular hypofunction: Secondary | ICD-10-CM

## 2020-12-03 ENCOUNTER — Other Ambulatory Visit: Payer: Self-pay | Admitting: Family Medicine

## 2020-12-03 DIAGNOSIS — E291 Testicular hypofunction: Secondary | ICD-10-CM

## 2020-12-03 NOTE — Telephone Encounter (Signed)
Can you resend?

## 2020-12-04 ENCOUNTER — Encounter: Payer: Self-pay | Admitting: Family Medicine

## 2020-12-04 DIAGNOSIS — I10 Essential (primary) hypertension: Secondary | ICD-10-CM

## 2020-12-04 MED ORDER — CARVEDILOL 3.125 MG PO TABS: 3.1250 mg | ORAL_TABLET | Freq: Two times a day (BID) | ORAL | 1 refills | Status: DC

## 2020-12-04 NOTE — Telephone Encounter (Signed)
Rx needs to go to [employer]

## 2021-03-27 ENCOUNTER — Other Ambulatory Visit: Payer: Self-pay | Admitting: Family Medicine

## 2021-03-27 DIAGNOSIS — E291 Testicular hypofunction: Secondary | ICD-10-CM

## 2021-03-27 NOTE — Telephone Encounter (Signed)
Last filled 02/26/21 Last OV: 11/2020 Next OV: due in August.

## 2021-03-31 NOTE — Telephone Encounter (Signed)
Last visit 4 months f/u was recommended, due for follow up. Thanks, BJ

## 2021-04-01 NOTE — Telephone Encounter (Signed)
I left patient a voicemail to call the office back to schedule an appointment.

## 2021-04-04 ENCOUNTER — Other Ambulatory Visit: Payer: Self-pay

## 2021-04-04 NOTE — Progress Notes (Signed)
Mr. Omar Licon. is a 60 y.o.male, who is here today to follow on HTN and hypogonadism. He was last seen on 11/30/2020.  Hypertension: Diagnosed several years ago. Medications: Lisinopril 40 mg and carvedilol 3.125 mg twice daily. BP readings at home:110's-130's/70-80's, occasionally SBP < 110 and DBP 90's. Side effects: None Negative for unusual or severe headache, visual changes, exertional chest pain, dyspnea,  focal weakness, or edema.  Lab Results  Component Value Date   CREATININE 1.05 11/30/2020   BUN 17 11/30/2020   NA 139 11/30/2020   K 4.3 11/30/2020   CL 101 11/30/2020   CO2 28 11/30/2020   Hypogonadism: Upset because he has to follow again. Last Rx was sent to his pharmacy on 03/31/21. Last refills 02/26/21.  Last testosterone was done after being out of medication for a few weeks. Tolerating medication well, denies side effects. He has been on testosterone replacement for 10-11 years and he feels like it helps.  Lab Results  Component Value Date   TESTOSTERONE 57.63 (L) 11/30/2020   Lab Results  Component Value Date   ALT 33 11/30/2020   AST 24 11/30/2020   ALKPHOS 55 11/30/2020   BILITOT 0.6 11/30/2020   Lab Results  Component Value Date   WBC 6.3 11/30/2020   HGB 15.3 11/30/2020   HCT 45.7 11/30/2020   MCV 88.5 11/30/2020   PLT 163.0 11/30/2020  ED he is not longer on sildenafil.  BHP: Nocturia x 3. Denies dysuria,increased urinary frequency, gross hematuria,or decreased urine output. He is not on pharmacologic treatment.  Lab Results  Component Value Date   PSA 1.63 06/13/2020   Bipolar disorder:Reporting major wt loss, 30 Lb or so due to "major psychiatric event", 2 months ago he had an exacerbation.  He follows with psychiatrist, Dr Georgann Housekeeper Sleeping 5-6 hours, hoping he will improve with new medication. He is on a new med, started yesterday. Next appt in 2 weeks.  Review of Systems  Constitutional:  Positive for appetite change.  Negative for activity change and fever.  HENT:  Negative for nosebleeds and sore throat.   Respiratory:  Negative for cough and wheezing.   Gastrointestinal:  Negative for abdominal pain, nausea and vomiting.  Neurological:  Negative for syncope, facial asymmetry and weakness.  Psychiatric/Behavioral:  Negative for confusion. The patient is nervous/anxious.   Rest see pertinent positives and negatives per HPI.  Current Outpatient Medications on File Prior to Visit  Medication Sig Dispense Refill   albuterol (PROVENTIL) (2.5 MG/3ML) 0.083% nebulizer solution Take 2.5 mg by nebulization every 6 (six) hours as needed for wheezing or shortness of breath.      albuterol (VENTOLIN HFA) 108 (90 Base) MCG/ACT inhaler Inhale 2 puffs into the lungs every 6 (six) hours as needed for wheezing or shortness of breath.      carvedilol (COREG) 3.125 MG tablet Take 3.125 mg by mouth 2 (two) times daily with a meal.     Cholecalciferol (VITAMIN D3) 125 MCG (5000 UT) TABS Take 5,000 Units by mouth daily.      fluticasone (FLONASE) 50 MCG/ACT nasal spray Place into both nostrils daily.     fluticasone-salmeterol (ADVAIR) 250-50 MCG/ACT AEPB Inhale 1 puff into the lungs in the morning and at bedtime.     INSULIN SYRINGE 1CC/29G (EXEL COMFORT POINT INSULIN SYR) 29G X 1/2" 1 ML MISC Inject testosterone 0.5 ml weekly 100 each 2   L-Methylfolate-Algae (DEPLIN 15 PO) Take 15 mg by mouth daily.  Omega-3 Fatty Acids (FISH OIL) 1200 MG CAPS Take 1,200 mg by mouth daily.      VRAYLAR 1.5 MG capsule Take 1.5 mg by mouth daily.     No current facility-administered medications on file prior to visit.   Past Medical History:  Diagnosis Date   Allergy    Anemia    Asthma    Bipolar II disorder ([place])    Depression    Gallstones    GERD (gastroesophageal reflux disease)    Hx of adenomatous colonic polyps    Hyperlipidemia    Hypertension    Pancreatitis    Seizures ([place])    past hx 6 yrs ago x 1- coming off meds  caused 1 seizure    Allergies  Allergen Reactions   Cephalexin Hives   Lipitor [Atorvastatin] Rash    Other reaction(s): Other (See Comments) Memory issues that have not resolved   Social History   Socioeconomic History   Marital status: Married    Spouse name: Not on file   Number of children: 1   Years of education: Not on file   Highest education level: Not on file  Occupational History   Occupation: retired Pharmacist, hospital  Tobacco Use   Smoking status: Never   Smokeless tobacco: Never  Substance and Sexual Activity   Alcohol use: Never   Drug use: Never   Sexual activity: Not on file  Other Topics Concern   Not on file  Social History Narrative   Not on file   Social Determinants of Health   Financial Resource Strain: Not on file  Food Insecurity: Not on file  Transportation Needs: Not on file  Physical Activity: Not on file  Stress: Not on file  Social Connections: Not on file   Vitals:   04/05/21 0704  BP: 128/80  Pulse: 90  Resp: 16  SpO2: 98%   Body mass index is 25.97 kg/m. Wt Readings from Last 3 Encounters:  04/05/21 181 lb (82.1 kg)  11/30/20 185 lb 6 oz (84.1 kg)  07/19/20 188 lb (85.3 kg)   Physical Exam Nursing note reviewed.  Constitutional:      General: He is not in acute distress.    Appearance: He is well-developed.  HENT:     Head: Normocephalic and atraumatic.  Eyes:     Conjunctiva/sclera: Conjunctivae normal.  Cardiovascular:     Rate and Rhythm: Normal rate and regular rhythm.     Pulses:          Dorsalis pedis pulses are 2+ on the right side and 2+ on the left side.     Heart sounds: No murmur heard. Pulmonary:     Effort: Pulmonary effort is normal. No respiratory distress.     Breath sounds: Normal breath sounds.  Abdominal:     Palpations: Abdomen is soft. There is no hepatomegaly or mass.     Tenderness: There is no abdominal tenderness.  Skin:    General: Skin is warm.     Findings: No erythema or rash.   Neurological:     Mental Status: He is alert and oriented to person, place, and time.     Cranial Nerves: No cranial nerve deficit.     Gait: Gait normal.  Psychiatric:        Mood and Affect: Affect is blunt.   ASSESSMENT AND PLAN:   Mr.Zollie was seen today for medication refill.  Diagnoses and all orders for this visit: Orders Placed This Encounter  Procedures  CBC   Testosterone   Hepatic function panel   PSA   BPH associated with nocturia Stable. We discussed side effects of testosterone replacement,including effects of prostate.  Hypogonadism in male He is tolerating testosterone replacement well and it is helping. We reviewed some side effects and some of the current recommendations in regard to testosterone replacement. Continue testosterone 100 mg weekly. We discussed the importance of following as recommended. Lab orders placed, lab is closed at this time but instructed to arrange Am lab appt next week. Next f/u in 6 months.  Hypertension, essential, benign BP adequately controlled. Continue current management: Lisinopril 40 mg daily and carvedilol 3.125 mg bid. DASH/low salt diet to continue. Monitor BP at home. Eye exam annually.  Bipolar II disorder ([place]) Problem is not well controlled. Started new medication yesterday. Following close with his psychiatrist.  Medication monitoring encounter - CBC; Future - Testosterone; Future - Hepatic function panel; Future - PSA; Future  Return in about 6 months (around 10/06/2021) for Needs lab appt..   Ren Aspinall G. Martinique, MD  [facility]. [place] office.

## 2021-04-05 ENCOUNTER — Other Ambulatory Visit: Payer: Self-pay

## 2021-04-05 ENCOUNTER — Ambulatory Visit (INDEPENDENT_AMBULATORY_CARE_PROVIDER_SITE_OTHER): Payer: Medicare Other | Admitting: Family Medicine

## 2021-04-05 ENCOUNTER — Encounter: Payer: Self-pay | Admitting: Family Medicine

## 2021-04-05 VITALS — BP 128/80 | HR 90 | Resp 16 | Ht 70.0 in | Wt 181.0 lb

## 2021-04-05 DIAGNOSIS — E291 Testicular hypofunction: Secondary | ICD-10-CM

## 2021-04-05 DIAGNOSIS — F3181 Bipolar II disorder: Secondary | ICD-10-CM

## 2021-04-05 DIAGNOSIS — N401 Enlarged prostate with lower urinary tract symptoms: Secondary | ICD-10-CM | POA: Diagnosis not present

## 2021-04-05 DIAGNOSIS — R351 Nocturia: Secondary | ICD-10-CM

## 2021-04-05 DIAGNOSIS — Z5181 Encounter for therapeutic drug level monitoring: Secondary | ICD-10-CM | POA: Diagnosis not present

## 2021-04-05 DIAGNOSIS — I10 Essential (primary) hypertension: Secondary | ICD-10-CM | POA: Diagnosis not present

## 2021-04-05 MED ORDER — LISINOPRIL 40 MG PO TABS: 40.0000 mg | ORAL_TABLET | Freq: Every day | ORAL | 2 refills | Status: DC

## 2021-04-05 NOTE — Assessment & Plan Note (Signed)
Problem is not well controlled. Started new medication yesterday. Following close with his psychiatrist.

## 2021-04-05 NOTE — Assessment & Plan Note (Addendum)
BP adequately controlled. Continue current management: Lisinopril 40 mg daily and carvedilol 3.125 mg bid. DASH/low salt diet to continue. Monitor BP at home. Eye exam annually.

## 2021-04-05 NOTE — Patient Instructions (Signed)
A few things to remember from today's visit:   Hypogonadism in male - Plan: CBC, Testosterone, Hepatic function panel  Hypertension, essential, benign  Medication monitoring encounter  BPH associated with nocturia - Plan: PSA  If you need refills please call your pharmacy. Do not use My Chart to request refills or for acute issues that need immediate attention.   No changes today. Future lab orders placed. Testosterone Subcutaneous Injection What is this medication? TESTOSTERONE (tes TOS ter one) is used to increase testosterone levels in yourbody. It belongs to a group of medications called androgen hormones. This medicine may be used for other purposes; ask your health care provider orpharmacist if you have questions. COMMON BRAND NAME(S): XYOSTED What should I tell my care team before I take this medication? They need to know if you have any of these conditions: Cancer Depression Diabetes Heart disease High blood pressure Kidney disease Liver disease Lung disease Prostate disease Suicidal thoughts, plans, or attempt; a previous suicide attempt by you or a family member An unusual or allergic reaction to testosterone, sesame oil, other medications, foods, dyes, or preservatives If a male partner is pregnant or trying to get pregnant Breast-feeding How should I use this medication? This medication is for injection under the skin. You will be taught how to prepare and give this medication. Use exactly as directed. Take your medicationat regular intervals. Do not take your medication more often than directed. A special MedGuide will be given to you by the pharmacist with eachprescription and refill. Be sure to read this information carefully each time. It is important that you put your used injectors in a special sharps container. Do not put them in a trash can. If you do not have a sharps container, callyour pharmacist or care team to get one. Talk to your care team about  the use of this medication in children. Specialcare may be needed. Overdosage: If you think you have taken too much of this medicine contact apoison control center or emergency room at once. NOTE: This medicine is only for you. Do not share this medicine with others. What if I miss a dose? If you miss a dose, take it as soon as you can. If it is almost time for your next dose, take only that dose. Do not take double or extra doses. Call yourcare team if you are not sure how to handle a missed dose. What may interact with this medication? Certain medications for colds or congestion, like ephedrine, phenylephrine, or pseudoephedrine Certain medications that treat or prevent blood clots like warfarin Medications for diabetes Steroid medications like prednisone or cortisone This list may not describe all possible interactions. Give your health care provider a list of all the medicines, herbs, non-prescription drugs, or dietary supplements you use. Also tell them if you smoke, drink alcohol, or use illegaldrugs. Some items may interact with your medicine. What should I watch for while using this medication? Visit your care team for regular checks on your progress. They will need tocheck the level of testosterone in your blood. This medication is only approved for use in men who have low levels of testosterone related to certain medical conditions. Heart attacks and strokes have been reported with the use of this medication. Notify your care team and seek emergency treatment if you develop breathing problems; changes in vision; confusion; chest pain or chest tightness; sudden arm pain; severe, sudden headache; trouble speaking or understanding; sudden numbness or weakness of the face, arm or leg; loss  of balance or coordination. Talk to your care team aboutthe risks and benefits of this medication. This medication may affect blood sugar levels. If you have diabetes, check with your care team before you  change your diet or the dose of your diabeticmedication. Testosterone injections are not commonly used in women. Women should inform their care team if they wish to become pregnant or think they might be pregnant. There is a potential for serious side effects to an unborn child. Talk to your care team or pharmacist for more information. Talk with your careteam about your birth control options while taking this medication. This medication is banned from use in athletes by most athletic organizations. What side effects may I notice from receiving this medication? Side effects that you should report to your care team as soon as possible: Allergic reactions-skin rash, itching or hives, swelling of the face, lips, tongue, or throat Blood clot-pain, swelling, or warmth in the leg, shortness of breath, chest pain Heart attack-pain or tightness in the chest, shoulders, arm, or jaw, nausea, shortness of breath, cold or clammy skin, feeling faint or lightheaded Increase in blood pressure Liver injury-right upper belly pain, loss of appetite, nausea, light-colored stool, dark yellow or brown urine, yellowing skin or eyes, unusual weakness or fatigue Mood swings, irritability, or hostility Prolonged or painful erection Sleep apnea-loud snoring, gasping during sleep, daytime sleepiness Stroke-sudden numbness or weakness of the face, arm, or leg, trouble speaking, confusion, trouble walking, loss of balance or coordination, dizziness, severe headache, change in vision Swelling of the ankle, hands, or feet Thoughts of suicide or self-harm, worsening mood, feelings of depression Side effects that usually do not require medical attention (report these toyour care team if they continue or are bothersome): Acne Change in sex drive or performance Pain, redness, or irritation at the application site Unexpected breast tissue growth This list may not describe all possible side effects. Call your doctor for medical  advice about side effects. You may report side effects to FDA at1-800-FDA-1088. Where should I keep my medication? Keep out of the reach of children and pets. Store between 15 and 30 degrees C (59 and 86 degrees F). Keep this medication in the original container until time of use. Do not refrigerate or freeze. Protect from light. Throw away any unused medication after the expiration dateon the label. NOTE: This sheet is a summary. It may not cover all possible information. If you have questions about this medicine, talk to your doctor, pharmacist, orhealth care provider.  2022 Elsevier/Gold Standard (2020-07-12 10:08:13)  Please be sure medication list is accurate. If a new problem present, please set up appointment sooner than planned today.

## 2021-04-05 NOTE — Assessment & Plan Note (Signed)
He is tolerating testosterone replacement well and it is helping. We reviewed some side effects and some of the current recommendations in regard to testosterone replacement. Continue testosterone 100 mg weekly. We discussed the importance of following as recommended. Lab orders placed, lab is closed at this time but instructed to arrange Am lab appt next week. Next f/u in 6 months.

## 2021-04-07 MED ORDER — TESTOSTERONE CYPIONATE 200 MG/ML IM SOLN: 100.0000 mg | INTRAMUSCULAR | 3 refills | Status: DC

## 2021-04-08 ENCOUNTER — Other Ambulatory Visit: Payer: Self-pay

## 2021-04-08 ENCOUNTER — Other Ambulatory Visit (INDEPENDENT_AMBULATORY_CARE_PROVIDER_SITE_OTHER): Payer: Medicare Other

## 2021-04-08 DIAGNOSIS — R351 Nocturia: Secondary | ICD-10-CM | POA: Diagnosis not present

## 2021-04-08 DIAGNOSIS — Z5181 Encounter for therapeutic drug level monitoring: Secondary | ICD-10-CM | POA: Diagnosis not present

## 2021-04-08 DIAGNOSIS — N401 Enlarged prostate with lower urinary tract symptoms: Secondary | ICD-10-CM

## 2021-04-08 DIAGNOSIS — E291 Testicular hypofunction: Secondary | ICD-10-CM

## 2021-04-08 LAB — CBC
HCT: 45 % (ref 39.0–52.0)
Hemoglobin: 15.2 g/dL (ref 13.0–17.0)
MCHC: 33.9 g/dL (ref 30.0–36.0)
MCV: 92.1 fl (ref 78.0–100.0)
Platelets: 214 10*3/uL (ref 150.0–400.0)
RBC: 4.88 Mil/uL (ref 4.22–5.81)
RDW: 12.4 % (ref 11.5–15.5)
WBC: 7.6 10*3/uL (ref 4.0–10.5)

## 2021-04-08 LAB — HEPATIC FUNCTION PANEL
ALT: 17 U/L (ref 0–53)
AST: 16 U/L (ref 0–37)
Albumin: 4.5 g/dL (ref 3.5–5.2)
Alkaline Phosphatase: 57 U/L (ref 39–117)
Bilirubin, Direct: 0.1 mg/dL (ref 0.0–0.3)
Total Bilirubin: 0.5 mg/dL (ref 0.2–1.2)
Total Protein: 7 g/dL (ref 6.0–8.3)

## 2021-04-08 LAB — TESTOSTERONE: Testosterone: 362.8 ng/dL (ref 300.00–890.00)

## 2021-04-08 LAB — PSA: PSA: 2.82 ng/mL (ref 0.10–4.00)

## 2021-04-09 ENCOUNTER — Encounter: Payer: Self-pay | Admitting: Family Medicine

## 2021-04-09 DIAGNOSIS — E291 Testicular hypofunction: Secondary | ICD-10-CM

## 2021-05-17 ENCOUNTER — Ambulatory Visit (INDEPENDENT_AMBULATORY_CARE_PROVIDER_SITE_OTHER): Payer: Medicare Other | Admitting: Psychiatry

## 2021-05-17 ENCOUNTER — Other Ambulatory Visit: Payer: Self-pay

## 2021-05-17 ENCOUNTER — Encounter: Payer: Self-pay | Admitting: Psychiatry

## 2021-05-17 VITALS — BP 134/87 | HR 79 | Ht 69.0 in | Wt 186.8 lb

## 2021-05-17 DIAGNOSIS — F411 Generalized anxiety disorder: Secondary | ICD-10-CM

## 2021-05-17 MED ORDER — BUSPIRONE HCL 15 MG PO TABS: ORAL_TABLET | ORAL | 1 refills | Status: DC

## 2021-05-17 NOTE — Progress Notes (Signed)
[facility] MD/PA/NP Initial Note  05/17/2021 6:21 PM Chase Hunter.  MRN:  [id number]  Chief Complaint:  Chief Complaint   Establish Care; Anxiety     HPI: Patient is a 60 year old male being seen for initial evaluation to establish care for mood disorder and anxiety. He reports that he has been under psychiatric treatment for 8 years and disabled for psychiatric conditions for 7 years.   He reports 8 years ago the principal was in his room and tapped him on the shoulder and caused an exaggerated startle response. He reports that in May he had a similar reaction to a stress response. He reports that he then felt he needed to come off all his medication in response to "not knowing what was going on with myself" and wanting to re-evaluate medications. He reports that he saw several psychiatric providers since he wanted to stop medication.   He recognizes now that he channeled past hypomania s/s into working 2-3 jobs. He reports that he now has increased insight into his illness. He reports h/o excessive attention to extensive detail and increased goal-directed activity.   He reports that he was started on Vraylar about 6 weeks ago- "my mind has stopped, but my body is still" sped up. He reports decreased racing thoughts.   He reports, "my mood has been good. I'm not depressed." Denies elevated mood. He reports some irritability. "Energy with no where to go." Motivation is elevated. He reports difficulty falling and staying asleep. He has been sleeping 6-7 hours a night the last few weeks and it had been 4-5 hours a night prior to that. Denies nightmares. He can concentrate on music. He has been unable to read due to poor concentration. Appetite has been good. He reports that he has chronic suicidal thoughts about hanging himself for many years. Denies any past suicide attempts. Denies suicidal intent. Contracts for safety. He reports possible intrusive thoughts.   Had some hallucinations  in May when he came off medications. He reports that he was hearing voices telling him things, such as watering plants and sometimes constructive messages. Denies any past VH. He reports that his wife has noticed he is paranoid at times- "ascribing motives to people that you have no way of knowing" or thinking people had certain opinions of him.  He reports that AH is less with Vraylar 1.5 mg and that AH can be distressing at times.   He reports anxiety. He describes a nervous and uneasy feeling. He reports feeling as if he needs to tap his hands and legs. He reports a few possible panic attacks where he could not form thoughts and had severe fear and felt like he was losing control or losing his mind. He reports constant worry and anxious thoughts. He reports catastrophic thoughts. Reports he has been working on rumination. He reports some checking behaviors, such as musical and recording procedures. He reports that he had to make certain gestures and have things symmetrical. Denies compulsive counting.  He reports that he now recognizes he has had predominantly hypomanic episodes throughout his lifetime. 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 He then started sleeping 16 hours a day, had low mood, and suicidal thoughts. He reports that he cannot recall last depressive episode.   He reports prior to medication he would spend excessively. Denies other impulsive or risky behavior. He reports pattern of "righteous indignation." He reports that he  was frequently starting arguments and challenging supervisors. He reports increased talkativeness during hypomania. Decreased sleep.   Has seen a therapist for 7 years.   Born and raised in [place]. Parents divorced when he was 41 yo and he had to take care of his younger brother and sister.  Moved to [facility] when he was 60 yo with his mother and had some support from her family. He started  playing music for money starting at age 33. Denies any past traumatic experiences.  Moved to [place] 18 years ago. 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 Married 35 years and reports wife is supportive. Has a daughter. Music and gardening are hobbies. Not currently active in church. He volunteered at a church a few months and became agitated when other musicians "did not do their parts." Wife, daughter, son-in-law, and a few friends are his primary supports. He reports drinking heavily in his 79's. No ETOH use since pancreatitis.   Past Psychiatric Medication Trials: Lamictal- Rash. Had seizure when it was stopped.  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 Abilify- cannot recall response. Minimal improvement. Seroquel-Affective dulling, excessive somnolence Latuda Trazodone- Increased BP and suicidal thoughts Propranolol Sertraline Lexapro Wellbutrin Duloxetine-excessively talkative. "Saying things out loud that I thought I was just thinking."   Visit Diagnosis:    ICD-10-CM   1. Generalized anxiety disorder  F41.1 busPIRone (BUSPAR) 15 MG tablet      Past Psychiatric History: Saw Hendricks Limes with [facility]. He then saw 2 psychiatrists and an NP in the same practice. Sees Jennette Banker with [facility] and has seen her for about 6 years.   Past Medical History:  Past Medical History:  Diagnosis Date   Allergy    Anemia    Asthma    Bipolar II disorder ([place])    Depression    Gallstones    GERD (gastroesophageal reflux disease)    Hx of adenomatous colonic polyps    Hyperlipidemia    Hypertension    Pancreatitis    Seizures ([place])    past hx 6 yrs ago x 1- coming off meds caused 1 seizure     Past Surgical History:  Procedure Laterality Date   COLONOSCOPY     ESOPHAGOGASTRODUODENOSCOPY N/A 07/19/2020   Procedure: ESOPHAGOGASTRODUODENOSCOPY (EGD);  Surgeon:  Milus Banister, MD;  Location: [facility];  Service: Endoscopy;  Laterality: N/A;   EUS N/A 07/19/2020   Procedure: UPPER ENDOSCOPIC ULTRASOUND (EUS) RADIAL;  Surgeon: Milus Banister, MD;  Location: [facility];  Service: Endoscopy;  Laterality: N/A;   POLYPECTOMY     Family History:  Family History  Problem Relation Age of Onset   Atrial fibrillation Mother    Anxiety disorder Mother    Obesity Mother    Mood Disorder Mother    Anxiety disorder Father    Prostate cancer Father    Brain cancer Father    Anxiety disorder Brother    Anxiety disorder Daughter    Colon cancer Neg Hx    Colon polyps Neg Hx    Esophageal cancer Neg Hx    Rectal cancer Neg Hx    Stomach cancer Neg Hx     Social History:  Social History   Socioeconomic History   Marital status: Married    Spouse name: Not on file   Number of children: 1   Years of education:  Not on file   Highest education level: Not on file  Occupational History   Occupation: retired Pharmacist, hospital  Tobacco Use   Smoking status: Never   Smokeless tobacco: Never  Substance and Sexual Activity   Alcohol use: Never   Drug use: Not Currently   Sexual activity: Not on file  Other Topics Concern   Not on file  Social History Narrative   Not on file   Social Determinants of Health   Financial Resource Strain: Not on file  Food Insecurity: Not on file  Transportation Needs: Not on file  Physical Activity: Not on file  Stress: Not on file  Social Connections: Not on file    Allergies:  Allergies  Allergen Reactions   Cephalexin Hives   Depakote [Divalproex Sodium]     Caused Pancreatitis   Trazodone And Nefazodone     Suicidal thoughts   Lamictal [Lamotrigine] Rash    Had a seizure when he stopped it   Lipitor [Atorvastatin] Rash    Other reaction(s): Other (See Comments) Memory issues that have not resolved    Metabolic Disorder Labs: Lab Results  Component Value Date   HGBA1C 5.6 07/15/2019   No results  found for: PROLACTIN Lab Results  Component Value Date   CHOL 294 (H) 11/30/2020   TRIG 178.0 (H) 11/30/2020   HDL 47.00 11/30/2020   CHOLHDL 6 11/30/2020   VLDL 35.6 11/30/2020   LDLCALC 212 (H) 11/30/2020   LDLCALC 145 (H) 05/14/2020   Lab Results  Component Value Date   TSH 1.19 11/30/2020    Therapeutic Level Labs: No results found for: LITHIUM Lab Results  Component Value Date   VALPROATE 33.1 (L) 07/15/2019   No components found for:  CBMZ  Current Medications: Current Outpatient Medications  Medication Sig Dispense Refill   busPIRone (BUSPAR) 15 MG tablet Take 1/3 tablet p.o. twice daily for 1 week, then take 2/3 tablet p.o. twice daily for 1 week, then take 1 tablet p.o. twice daily 60 tablet 1   Cholecalciferol (VITAMIN D3) 125 MCG (5000 UT) TABS Take 5,000 Units by mouth daily.      L-Methylfolate-Algae (DEPLIN 15 PO) Take 15 mg by mouth daily.     lisinopril (ZESTRIL) 40 MG tablet Take 1 tablet (40 mg total) by mouth daily. 90 tablet 2   Omega-3 Fatty Acids (FISH OIL) 1200 MG CAPS Take 1,200 mg by mouth daily.      testosterone cypionate (DEPOTESTOSTERONE CYPIONATE) 200 MG/ML injection Inject 0.5 mLs (100 mg total) into the muscle once a week. 2.5 mL 3   Vitamins-Lipotropics (LIPOFLAVONOID PO) Take by mouth.     VRAYLAR 1.5 MG capsule Take 1.5 mg by mouth daily.     albuterol (PROVENTIL) (2.5 MG/3ML) 0.083% nebulizer solution Take 2.5 mg by nebulization every 6 (six) hours as needed for wheezing or shortness of breath.      albuterol (VENTOLIN HFA) 108 (90 Base) MCG/ACT inhaler Inhale 2 puffs into the lungs every 6 (six) hours as needed for wheezing or shortness of breath.      carvedilol (COREG) 3.125 MG tablet Take 3.125 mg by mouth 2 (two) times daily with a meal.     fluticasone (FLONASE) 50 MCG/ACT nasal spray Place into both nostrils daily.     fluticasone-salmeterol (ADVAIR) 250-50 MCG/ACT AEPB Inhale 1 puff into the lungs in the morning and at bedtime.      INSULIN SYRINGE 1CC/29G (EXEL COMFORT POINT INSULIN SYR) 29G X 1/2" 1 ML MISC Inject  testosterone 0.5 ml weekly 100 each 2   No current facility-administered medications for this visit.    Medication Side Effects:  Possible akathisia  Orders placed this visit:  No orders of the defined types were placed in this encounter.   Psychiatric Specialty Exam:  Review of Systems  Constitutional:  Positive for unexpected weight change.  HENT: Negative.    Eyes: Negative.   Respiratory: Negative.    Cardiovascular: Negative.   Gastrointestinal: Negative.   Endocrine: Negative.   Genitourinary: Negative.   Musculoskeletal: Negative.   Skin: Negative.   Allergic/Immunologic: Negative.   Neurological: Negative.   Hematological: Negative.   Psychiatric/Behavioral:         Please refer to HPI   Blood pressure 134/87, pulse 79, height '5\' 9"'$  (1.753 m), weight 186 lb 12.8 oz (84.7 kg).Body mass index is 27.59 kg/m.  General Appearance: Casual, Neat, and Well Groomed  Eye Contact:  Good  Speech:  Clear and Coherent, Pressured, and Talkative  Volume:  Normal  Mood:   Hypomanic  Affect:  Congruent, Constricted, and Anxious  Thought Process:  Coherent and Descriptions of Associations: Intact  Orientation:  Full (Time, Place, and Person)  Thought Content: Hallucinations: Auditory   Suicidal Thoughts:  Yes.  without intent/plan  Homicidal Thoughts:  No  Memory:  WNL  Judgement:  Fair  Insight:  Good  Psychomotor Activity:  Increased and Restlessness  Concentration:  Concentration: Fair and Attention Span: Fair  Recall:  Good  Fund of Knowledge: Good  Language: Good  Assets:  Communication Skills Desire for Improvement Resilience Social Support Vocational/Educational  ADL's:  Intact  Cognition: WNL  Prognosis:  Fair   Screenings:  PHQ2-9    [place] Office Visit from 06/13/2020 in [place] at [facility] [employer] Total Score 0       Receiving Psychotherapy: Yes    Treatment Plan/Recommendations: Patient seen for 60 minutes and time spent reviewing mood signs and symptoms, as well as anxiety.  Also discussed response to treatment and adverse effects from past medication trials.  Information release is obtained at time of exam for previous psychiatric providers and [facility] test results. Discussed that it would be helpful to have results from pharmacogenetic testing since he has had adverse effects with multiple past psychiatric medications.  Recommended avoiding antiseizure medications due to history of pancreatitis with Depakote, Stevens-Johnson syndrome with Lamictal, and history of seizure with stopping Lamictal.  Discussed that atypical antipsychotics may be most helpful for his mood signs and symptoms.  Reviewed potential side effect of akathisia and patient reports that he has experienced possible akathisia since starting Vraylar about 6 weeks ago.  He reports that Arman Filter has been helpful for his signs and symptoms and that restlessness has been improving, and he would therefore like to continue Vraylar to determine if restlessness will continue to improve. Discussed potential benefits, risks, and side effects of BuSpar.  Patient agrees to trial of BuSpar.  Will start BuSpar 15 mg 1/3 tablet twice daily for 1 week, then increase to 2/3 tablet twice daily for 1 week, then increase to 1 tablet twice daily for anxiety.  Discussed that he could wait to start BuSpar until results of doing psych testing are available.  Recommend continuing psychotherapy.  Patient to follow-up in 6 weeks or sooner if clinically indicated. Patient advised to contact office with any questions, adverse effects, or acute worsening in signs and symptoms.    Thayer Headings, PMHNP

## 2021-05-21 ENCOUNTER — Telehealth: Payer: Self-pay | Admitting: Psychiatry

## 2021-05-21 NOTE — Telephone Encounter (Signed)
Pt informed

## 2021-05-21 NOTE — Telephone Encounter (Signed)
Please contact pt to let him know that records from [place] were received with [facility] testing results. Buspar was listed as "Use as Directed" without a known genetic contraindication.

## 2021-05-29 ENCOUNTER — Telehealth: Payer: Self-pay | Admitting: Psychiatry

## 2021-05-29 NOTE — Telephone Encounter (Signed)
Pt LVM stating that the Buspar Chase Hunter recently prescribed seems to be raising his blood pressure.  The top number has been 150-170 and the bottom number has been 100-110.  He has stopped taking the med and would like to know what to do next.  Next appt 10/28

## 2021-05-29 NOTE — Telephone Encounter (Signed)
Pt stated he noticed his BP increased 4 days ago.He wasn't feeling good and decided to check it.He stated the only time his BP is this high is when its related to a medication,last time it was trazodone that caused it.He stopped taking the buspar yesterday and BP has gone down a little to 140/100.

## 2021-05-29 NOTE — Telephone Encounter (Signed)
Please review

## 2021-05-30 NOTE — Telephone Encounter (Signed)
He has not been taking propranolol.He said it did not help with any of those things.His restlessness has not been improved.

## 2021-06-01 ENCOUNTER — Other Ambulatory Visit: Payer: Self-pay | Admitting: Family Medicine

## 2021-06-01 DIAGNOSIS — I10 Essential (primary) hypertension: Secondary | ICD-10-CM

## 2021-06-14 ENCOUNTER — Ambulatory Visit: Payer: Medicare Other

## 2021-06-14 ENCOUNTER — Ambulatory Visit (INDEPENDENT_AMBULATORY_CARE_PROVIDER_SITE_OTHER): Payer: Medicare Other

## 2021-06-14 DIAGNOSIS — Z0000 Encounter for general adult medical examination without abnormal findings: Secondary | ICD-10-CM

## 2021-06-14 NOTE — Patient Instructions (Addendum)
Chase Hunter , Thank you for taking time to come for your Medicare Wellness Visit. I appreciate your ongoing commitment to your health goals. Please review the following plan we discussed and let me know if I can assist you in the future.   Screening recommendations/referrals: Colonoscopy: 07/09/2020 Recommended yearly ophthalmology/optometry visit for glaucoma screening and checkup Recommended yearly dental visit for hygiene and checkup  Vaccinations: Influenza vaccine: completed  Pneumococcal vaccine: completed series  Tdap vaccine: 08/03/2017 Shingles vaccine: completed series     Advanced directives: will provide copies   Conditions/risks identified: Depression-Patient states he is on disability due to depression and states he wanted all the depression questions answered. He is  currently under going  treatment with Thayer Headings at [place] and therpy with [facility] in [place].  Patient denies any suicidal  ideations at this time. Advised to call 911 or report to ER if any suicidal Ideations occur.  Pt verbalizes understanding.  Next appointment: none   Preventive Care 54 Years and Older, Male Preventive care refers to lifestyle choices and visits with your health care provider that can promote health and wellness. What does preventive care include? A yearly physical exam. This is also called an annual well check. Dental exams once or twice a year. Routine eye exams. Ask your health care provider how often you should have your eyes checked. Personal lifestyle choices, including: Daily care of your teeth and gums. Regular physical activity. Eating a healthy diet. Avoiding tobacco and drug use. Limiting alcohol use. Practicing safe sex. Taking low doses of aspirin every day. Taking vitamin and mineral supplements as recommended by your health care provider. What happens during an annual well check? The services and screenings done by your health care  provider during your annual well check will depend on your age, overall health, lifestyle risk factors, and family history of disease. Counseling  Your health care provider may ask you questions about your: Alcohol use. Tobacco use. Drug use. Emotional well-being. Home and relationship well-being. Sexual activity. Eating habits. History of falls. Memory and ability to understand (cognition). Work and work Statistician. Screening  You may have the following tests or measurements: Height, weight, and BMI. Blood pressure. Lipid and cholesterol levels. These may be checked every 5 years, or more frequently if you are over 61 years old. Skin check. Lung cancer screening. You may have this screening every year starting at age 57 if you have a 30-pack-year history of smoking and currently smoke or have quit within the past 15 years. Fecal occult blood test (FOBT) of the stool. You may have this test every year starting at age 16. Flexible sigmoidoscopy or colonoscopy. You may have a sigmoidoscopy every 5 years or a colonoscopy every 10 years starting at age 56. Prostate cancer screening. Recommendations will vary depending on your family history and other risks. Hepatitis C blood test. Hepatitis B blood test. Sexually transmitted disease (STD) testing. Diabetes screening. This is done by checking your blood sugar (glucose) after you have not eaten for a while (fasting). You may have this done every 1-3 years. Abdominal aortic aneurysm (AAA) screening. You may need this if you are a current or former smoker. Osteoporosis. You may be screened starting at age 80 if you are at high risk. Talk with your health care provider about your test results, treatment options, and if necessary, the need for more tests. Vaccines  Your health care provider may recommend certain vaccines, such as: Influenza vaccine. This  is recommended every year. Tetanus, diphtheria, and acellular pertussis (Tdap, Td)  vaccine. You may need a Td booster every 10 years. Zoster vaccine. You may need this after age 49. Pneumococcal 13-valent conjugate (PCV13) vaccine. One dose is recommended after age 68. Pneumococcal polysaccharide (PPSV23) vaccine. One dose is recommended after age 69. Talk to your health care provider about which screenings and vaccines you need and how often you need them. This information is not intended to replace advice given to you by your health care provider. Make sure you discuss any questions you have with your health care provider. Document Released: 09/14/2015 Document Revised: 05/07/2016 Document Reviewed: 06/19/2015 Elsevier Interactive Patient Education  2017 [place] Prevention in the Home Falls can cause injuries. They can happen to people of all ages. There are many things you can do to make your home safe and to help prevent falls. What can I do on the outside of my home? Regularly fix the edges of walkways and driveways and fix any cracks. Remove anything that might make you trip as you walk through a door, such as a raised step or threshold. Trim any bushes or trees on the path to your home. Use bright outdoor lighting. Clear any walking paths of anything that might make someone trip, such as rocks or tools. Regularly check to see if handrails are loose or broken. Make sure that both sides of any steps have handrails. Any raised decks and porches should have guardrails on the edges. Have any leaves, snow, or ice cleared regularly. Use sand or salt on walking paths during winter. Clean up any spills in your garage right away. This includes oil or grease spills. What can I do in the bathroom? Use night lights. Install grab bars by the toilet and in the tub and shower. Do not use towel bars as grab bars. Use non-skid mats or decals in the tub or shower. If you need to sit down in the shower, use a plastic, non-slip stool. Keep the floor dry. Clean up any  water that spills on the floor as soon as it happens. Remove soap buildup in the tub or shower regularly. Attach bath mats securely with double-sided non-slip rug tape. Do not have throw rugs and other things on the floor that can make you trip. What can I do in the bedroom? Use night lights. Make sure that you have a light by your bed that is easy to reach. Do not use any sheets or blankets that are too big for your bed. They should not hang down onto the floor. Have a firm chair that has side arms. You can use this for support while you get dressed. Do not have throw rugs and other things on the floor that can make you trip. What can I do in the kitchen? Clean up any spills right away. Avoid walking on wet floors. Keep items that you use a lot in easy-to-reach places. If you need to reach something above you, use a strong step stool that has a grab bar. Keep electrical cords out of the way. Do not use floor polish or wax that makes floors slippery. If you must use wax, use non-skid floor wax. Do not have throw rugs and other things on the floor that can make you trip. What can I do with my stairs? Do not leave any items on the stairs. Make sure that there are handrails on both sides of the stairs and use them. Fix handrails that  are broken or loose. Make sure that handrails are as long as the stairways. Check any carpeting to make sure that it is firmly attached to the stairs. Fix any carpet that is loose or worn. Avoid having throw rugs at the top or bottom of the stairs. If you do have throw rugs, attach them to the floor with carpet tape. Make sure that you have a light switch at the top of the stairs and the bottom of the stairs. If you do not have them, ask someone to add them for you. What else can I do to help prevent falls? Wear shoes that: Do not have high heels. Have rubber bottoms. Are comfortable and fit you well. Are closed at the toe. Do not wear sandals. If you use a  stepladder: Make sure that it is fully opened. Do not climb a closed stepladder. Make sure that both sides of the stepladder are locked into place. Ask someone to hold it for you, if possible. Clearly mark and make sure that you can see: Any grab bars or handrails. First and last steps. Where the edge of each step is. Use tools that help you move around (mobility aids) if they are needed. These include: Canes. Walkers. Scooters. Crutches. Turn on the lights when you go into a dark area. Replace any light bulbs as soon as they burn out. Set up your furniture so you have a clear path. Avoid moving your furniture around. If any of your floors are uneven, fix them. If there are any pets around you, be aware of where they are. Review your medicines with your doctor. Some medicines can make you feel dizzy. This can increase your chance of falling. Ask your doctor what other things that you can do to help prevent falls. This information is not intended to replace advice given to you by your health care provider. Make sure you discuss any questions you have with your health care provider. Document Released: 06/14/2009 Document Revised: 01/24/2016 Document Reviewed: 09/22/2014 Elsevier Interactive Patient Education  2017 [employer].

## 2021-06-14 NOTE — Progress Notes (Signed)
Subjective:   Chase Hunter. is a 60 y.o. male who presents for an Subsequent Medicare Annual Wellness Visit.  Review of Systems           Objective:    Today's Vitals   There is no height or weight on file to calculate BMI.  Advanced Directives 06/14/2021 07/19/2020 05/02/2020  Does Patient Have a Medical Advance Directive? Yes Yes No  Type of Paramedic of [place];Living will [place] in Chart? No - copy requested No - copy requested -  Would patient like information on creating a medical advance directive? - - No - Patient declined    Current Medications (verified) Outpatient Encounter Medications as of 06/14/2021  Medication Sig   albuterol (PROVENTIL) (2.5 MG/3ML) 0.083% nebulizer solution Take 2.5 mg by nebulization every 6 (six) hours as needed for wheezing or shortness of breath.    albuterol (VENTOLIN HFA) 108 (90 Base) MCG/ACT inhaler Inhale 2 puffs into the lungs every 6 (six) hours as needed for wheezing or shortness of breath.    carvedilol (COREG) 3.125 MG tablet TAKE 1 TABLET (3.125 MG TOTAL) BY MOUTH 2 (TWO) TIMES DAILY WITH A MEAL.   Cholecalciferol (VITAMIN D3) 125 MCG (5000 UT) TABS Take 5,000 Units by mouth daily.    fluticasone (FLONASE) 50 MCG/ACT nasal spray Place into both nostrils daily.   fluticasone-salmeterol (ADVAIR) 250-50 MCG/ACT AEPB Inhale 1 puff into the lungs in the morning and at bedtime.   INSULIN SYRINGE 1CC/29G (EXEL COMFORT POINT INSULIN SYR) 29G X 1/2" 1 ML MISC Inject testosterone 0.5 ml weekly   L-Methylfolate-Algae (DEPLIN 15 PO) Take 15 mg by mouth daily.   lisinopril (ZESTRIL) 40 MG tablet Take 1 tablet (40 mg total) by mouth daily.   Omega-3 Fatty Acids (FISH OIL) 1200 MG CAPS Take 1,200 mg by mouth daily.    testosterone cypionate (DEPOTESTOSTERONE CYPIONATE) 200 MG/ML injection Inject 0.5 mLs (100 mg total) into the muscle once a week.    Vitamins-Lipotropics (LIPOFLAVONOID PO) Take by mouth.   VRAYLAR 1.5 MG capsule Take 1.5 mg by mouth daily.   busPIRone (BUSPAR) 15 MG tablet Take 1/3 tablet p.o. twice daily for 1 week, then take 2/3 tablet p.o. twice daily for 1 week, then take 1 tablet p.o. twice daily (Patient not taking: Reported on 06/14/2021)   No facility-administered encounter medications on file as of 06/14/2021.    Allergies (verified) Cephalexin, Depakote [divalproex sodium], Trazodone and nefazodone, Lamictal [lamotrigine], and Lipitor [atorvastatin]   History: Past Medical History:  Diagnosis Date   Allergy    Anemia    Asthma    Bipolar II disorder ([place])    Depression    Gallstones    GERD (gastroesophageal reflux disease)    Hx of adenomatous colonic polyps    Hyperlipidemia    Hypertension    Pancreatitis    Seizures ([place])    past hx 6 yrs ago x 1- coming off meds caused 1 seizure    Past Surgical History:  Procedure Laterality Date   COLONOSCOPY     ESOPHAGOGASTRODUODENOSCOPY N/A 07/19/2020   Procedure: ESOPHAGOGASTRODUODENOSCOPY (EGD);  Surgeon: Milus Banister, MD;  Location: [facility];  Service: Endoscopy;  Laterality: N/A;   EUS N/A 07/19/2020   Procedure: UPPER ENDOSCOPIC ULTRASOUND (EUS) RADIAL;  Surgeon: Milus Banister, MD;  Location: [facility];  Service: Endoscopy;  Laterality: N/A;   POLYPECTOMY     Family  History  Problem Relation Age of Onset   Atrial fibrillation Mother    Anxiety disorder Mother    Obesity Mother    Mood Disorder Mother    Anxiety disorder Father    Prostate cancer Father    Brain cancer Father    Anxiety disorder Brother    Anxiety disorder Daughter    Colon cancer Neg Hx    Colon polyps Neg Hx    Esophageal cancer Neg Hx    Rectal cancer Neg Hx    Stomach cancer Neg Hx    Social History   Socioeconomic History   Marital status: Married    Spouse name: Not on file   Number of children: 1   Years of education: Not on file   Highest  education level: Not on file  Occupational History   Occupation: retired Pharmacist, hospital  Tobacco Use   Smoking status: Never   Smokeless tobacco: Never  Substance and Sexual Activity   Alcohol use: Never   Drug use: Not Currently   Sexual activity: Not on file  Other Topics Concern   Not on file  Social History Narrative   Not on file   Social Determinants of Health   Financial Resource Strain: Low Risk    Difficulty of Paying Living Expenses: Not hard at all  Food Insecurity: No Food Insecurity   Worried About Charity fundraiser in the Last Year: Never true   [place] in the Last Year: Never true  Transportation Needs: No Transportation Needs   Lack of Transportation (Medical): No   Lack of Transportation (Non-Medical): No  Physical Activity: Insufficiently Active   Days of Exercise per Week: 3 days   Minutes of Exercise per Session: 40 min  Stress: Stress Concern Present   Feeling of Stress : Very much  Social Connections: Moderately Integrated   Frequency of Communication with Friends and Family: Three times a week   Frequency of Social Gatherings with Friends and Family: Three times a week   Attends Religious Services: Never   Active Member of Clubs or Organizations: Yes   Attends Music therapist: More than 4 times per year   Marital Status: Married    Tobacco Counseling Counseling given: Not Answered   Clinical Intake:  Pre-visit preparation completed: Yes  Pain : No/denies pain     Nutritional Risks: Unintentional weight loss (During a Pychatric issue in June) Diabetes: No  How often do you need to have someone help you when you read instructions, pamphlets, or other written materials from your doctor or pharmacy?: 1 - Never What is the last grade level you completed in school?: [place]?: No  Information entered by :: [place] of Daily Living No flowsheet data found.  Patient Care  Team: [place], Betty G, MD as PCP - General (Family Medicine)  Indicate any recent Medical Services you may have received from other than [facility] providers in the past year (date may be approximate).     Assessment:   This is a routine wellness examination for Chase Hunter.  Hearing/Vision screen Vision Screening - Comments:: Annual eye exams wears glasses   Dietary issues and exercise activities discussed:     Goals Addressed   None    Depression Screen PHQ 2/9 Scores 06/14/2021 06/14/2021 06/13/2020  PHQ - 2 Score 2 2 0  PHQ- 9 Score - 12 -  Exception Documentation - - Other- indicate reason in comment box  Not completed - - Follows with psychiatrist    Fall Risk Fall Risk  06/14/2021 06/13/2020  Falls in the past year? 0 0  Number falls in past yr: 0 0  Injury with Fall? 0 0  Follow up Falls evaluation completed Education provided    [place]:  Any stairs in or around the home? Yes  If so, are there any without handrails? No  Home free of loose throw rugs in walkways, pet beds, electrical cords, etc? Yes  Adequate lighting in your home to reduce risk of falls? Yes   ASSISTIVE DEVICES UTILIZED TO PREVENT FALLS:  Life alert? No  Use of a cane, walker or w/c? No  Grab bars in the bathroom? No  Shower chair or bench in shower? No  Elevated toilet seat or a handicapped toilet? Yes    Cognitive Function:    Normal cognitive status assessed by direct observation by this Nurse Health Advisor. No abnormalities found.      Immunizations Immunization History  Administered Date(s) Administered   Influenza,inj,Quad PF,6+ Mos 09/07/2015, 10/23/2016, 08/03/2018, 07/15/2019, 06/13/2020   Moderna Sars-Covid-2 Vaccination 11/25/2019   Pneumococcal Polysaccharide-23 01/22/2017   Tdap 08/03/2017   Zoster Recombinat (Shingrix) 12/06/2016, 02/28/2017    TDAP status: Up to date  Flu Vaccine status: Up to date  Pneumococcal vaccine status: Up  to date  Covid-19 vaccine status: Completed vaccines  Qualifies for Shingles Vaccine? Yes   Zostavax completed No   Shingrix Completed?: Yes  Screening Tests Health Maintenance  Topic Date Due   COVID-19 Vaccine (2 - Moderna series) 12/23/2019   INFLUENZA VACCINE  04/01/2021   HIV Screening  06/17/2023 (Originally 07/04/1976)   COLONOSCOPY (Pts 45-58yrs Insurance coverage will need to be confirmed)  07/09/2025   TETANUS/TDAP  08/04/2027   Hepatitis C Screening  Completed   Zoster Vaccines- Shingrix  Completed   HPV VACCINES  Aged Out    Health Maintenance  Health Maintenance Due  Topic Date Due   COVID-19 Vaccine (2 - Moderna series) 12/23/2019   INFLUENZA VACCINE  04/01/2021    Colorectal cancer screening: Type of screening: Colonoscopy. Completed 07/09/2020. Repeat every 5 years  Lung Cancer Screening: (Low Dose CT Chest recommended if Age 52-80 years, 30 pack-year currently smoking OR have quit w/in 15years.) does not qualify.   Lung Cancer Screening Referral: n/a  Additional Screening:  Hepatitis C Screening: does not qualify;  Vision Screening: Recommended annual ophthalmology exams for early detection of glaucoma and other disorders of the eye. Is the patient up to date with their annual eye exam?  Yes  Who is the provider or what is the name of the office in which the patient attends annual eye exams? Dr.Glenn If pt is not established with a provider, would they like to be referred to a provider to establish care? No .   Dental Screening: Recommended annual dental exams for proper oral hygiene  Community Resource Referral / Chronic Care Management: CRR required this visit?  No   CCM required this visit?  No      Plan:     I have personally reviewed and noted the following in the patient's chart:   Medical and social history Use of alcohol, tobacco or illicit drugs  Current medications and supplements including opioid prescriptions. Patient is not  currently taking opioid prescriptions. Functional ability and status Nutritional status Physical activity Advanced directives List of other physicians Hospitalizations, surgeries, and ER visits in previous 12 months  Vitals Screenings to include cognitive, depression, and falls Referrals and appointments  In addition, I have reviewed and discussed with patient certain preventive protocols, quality metrics, and best practice recommendations. A written personalized care plan for preventive services as well as general preventive health recommendations were provided to patient.     Randel Pigg, LPN   [id number]   Nurse Notes: Patient states he is on disability due to depression and states he wanted all these questions answered currently under going  treatment with Thayer Headings at [place] and therpy with [facility] in [place]. Patient denies any suicidal  ideations at this time. Advised to call 911 or report to ER if any suicidal Ideations occur.  Pt verbalizes understanding.

## 2021-06-27 ENCOUNTER — Encounter: Payer: Self-pay | Admitting: Psychiatry

## 2021-06-28 ENCOUNTER — Telehealth (INDEPENDENT_AMBULATORY_CARE_PROVIDER_SITE_OTHER): Payer: Medicare Other | Admitting: Psychiatry

## 2021-06-28 ENCOUNTER — Encounter: Payer: Self-pay | Admitting: Psychiatry

## 2021-06-28 DIAGNOSIS — F3181 Bipolar II disorder: Secondary | ICD-10-CM

## 2021-06-28 MED ORDER — CARIPRAZINE HCL 3 MG PO CAPS: 3.0000 mg | ORAL_CAPSULE | ORAL | 0 refills | Status: DC

## 2021-06-28 MED ORDER — VRAYLAR 1.5 MG PO CAPS: 1.5000 mg | ORAL_CAPSULE | Freq: Every day | ORAL | 0 refills | Status: DC

## 2021-06-28 NOTE — Progress Notes (Signed)
Chase Hunter Chase Hunter [id number] [birth year] 60 y.o.  Virtual Visit via Video Note  I connected with pt @ on 06/28/21 at  8:30 AM EDT by a video enabled telemedicine application and verified that I am speaking with the correct person using two identifiers.   I discussed the limitations of evaluation and management by telemedicine and the availability of in person appointments. The patient expressed understanding and agreed to proceed.  I discussed the assessment and treatment plan with the patient. The patient was provided an opportunity to ask questions and all were answered. The patient agreed with the plan and demonstrated an understanding of the instructions.   The patient was advised to call back or seek an in-person evaluation if the symptoms worsen or if the condition fails to improve as anticipated.  I provided 30 minutes of non-face-to-face time during this encounter.  The patient was located at home.  The provider was located at [place].   Thayer Headings, PMHNP   Subjective:   Patient ID:  Chase Hunter. is a 60 y.o. (DOB 30-Aug-1961) male.  Chief Complaint:  Chief Complaint  Patient presents with   Anxiety   Depression    HPI Chase Hunter. presents for follow-up of anxiety and mood disturbance. He reports that he has been continuing to have increased anxiety. He reports that his BP has improved and is now consistent with baseline of being slightly elevated (130-140/90).Buspar seemed to cause agitation and increase in BP.   He reports that he has intrusive thoughts and this leads to agitation. He has also had worry and some catastrophic thinking. He reports some minor panic attacks. Denies obsessive thoughts.   He reports that he has been "more on the depressed side." He reports feeling somewhat discouraged. He reports that he has been able to do what he needs to do. Denies any manic s/s to include excessive energy, impulsivity, and risky  behaviors. Sleep is ok unless he awakens with panic. Reports that sleep is not as regular or deep. Appetite has been ok. He reports poor concentration and difficulty sustaining focus. Denies anhedonia. He reports fleeting suicidal thoughts in the form of intrusive thoughts. Denies suicidal intent or plan.   Reports that Arman Filter has helped with racing thoughts, although he continues to have some racing thoughts. He reports, "in general I feel better."   Seeing therapist.   Past Psychiatric Medication Trials: Lamictal- Rash. Had seizure when it was stopped.  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 Abilify- cannot recall response. Minimal improvement. Seroquel-Affective dulling, excessive somnolence. Took up to 600 mg QHS Latuda Trazodone- Increased BP and suicidal thoughts Propranolol Sertraline Lexapro Wellbutrin Duloxetine-excessively talkative. "Saying things out loud that I thought I was just thinking."  Buspar-Increased anxiety and increased BP.   Review of Systems:  Review of Systems  Musculoskeletal:  Negative for gait problem.  Neurological:  Negative for tremors.  Psychiatric/Behavioral:         Please refer to HPI   Medications: I have reviewed the patient's current medications.  Current Outpatient Medications  Medication Sig Dispense Refill   cariprazine (VRAYLAR) 3 MG capsule Take 1 capsule (3 mg total) by mouth every other day. Alternating with 1.5 mg every other day 30 capsule 0   Cholecalciferol (VITAMIN D3) 125 MCG (5000 UT) TABS Take 5,000 Units by mouth daily.      fluticasone (FLONASE) 50 MCG/ACT nasal spray Place into both  nostrils daily.     fluticasone-salmeterol (ADVAIR) 250-50 MCG/ACT AEPB Inhale 1 puff into the lungs in the morning and at bedtime.     L-Methylfolate-Algae (DEPLIN 15 PO) Take 15 mg by mouth daily.     lisinopril (ZESTRIL) 40 MG tablet Take 1 tablet (40 mg total) by  mouth daily. 90 tablet 2   Omega-3 Fatty Acids (FISH OIL) 1200 MG CAPS Take 1,200 mg by mouth daily.      testosterone cypionate (DEPOTESTOSTERONE CYPIONATE) 200 MG/ML injection Inject 0.5 mLs (100 mg total) into the muscle once a week. 2.5 mL 3   Vitamins-Lipotropics (LIPOFLAVONOID PO) Take by mouth.     albuterol (PROVENTIL) (2.5 MG/3ML) 0.083% nebulizer solution Take 2.5 mg by nebulization every 6 (six) hours as needed for wheezing or shortness of breath.      albuterol (VENTOLIN HFA) 108 (90 Base) MCG/ACT inhaler Inhale 2 puffs into the lungs every 6 (six) hours as needed for wheezing or shortness of breath.      carvedilol (COREG) 3.125 MG tablet TAKE 1 TABLET (3.125 MG TOTAL) BY MOUTH 2 (TWO) TIMES DAILY WITH A MEAL. (Patient not taking: Reported on 06/28/2021) 180 tablet 1   INSULIN SYRINGE 1CC/29G (EXEL COMFORT POINT INSULIN SYR) 29G X 1/2" 1 ML MISC Inject testosterone 0.5 ml weekly 100 each 2   VRAYLAR 1.5 MG capsule Take 1 capsule (1.5 mg total) by mouth daily. 90 capsule 0   No current facility-administered medications for this visit.    Medication Side Effects: None  Allergies:  Allergies  Allergen Reactions   Cephalexin Hives   Depakote [Divalproex Sodium]     Caused Pancreatitis   Trazodone And Nefazodone     Suicidal thoughts   Lamictal [Lamotrigine] Rash    Had a seizure when he stopped it   Lipitor [Atorvastatin] Rash    Other reaction(s): Other (See Comments) Memory issues that have not resolved    Past Medical History:  Diagnosis Date   Allergy    Anemia    Asthma    Bipolar II disorder ([place])    Depression    Gallstones    GERD (gastroesophageal reflux disease)    Hx of adenomatous colonic polyps    Hyperlipidemia    Hypertension    Pancreatitis    Seizures ([facility])    past hx 6 yrs ago x 1- coming off meds caused 1 seizure     Family History  Problem Relation Age of Onset   Atrial fibrillation Mother    Anxiety disorder Mother    Obesity Mother     Mood Disorder Mother    Anxiety disorder Father    Prostate cancer Father    Brain cancer Father    Anxiety disorder Brother    Anxiety disorder Daughter    Colon cancer Neg Hx    Colon polyps Neg Hx    Esophageal cancer Neg Hx    Rectal cancer Neg Hx    Stomach cancer Neg Hx     Social History   Socioeconomic History   Marital status: Married    Spouse name: Not on file   Number of children: 1   Years of education: Not on file   Highest education level: Not on file  Occupational History   Occupation: retired Pharmacist, hospital  Tobacco Use   Smoking status: Never   Smokeless tobacco: Never  Substance and Sexual Activity   Alcohol use: Never   Drug use: Not Currently   Sexual activity: Not on file  Other Topics Concern   Not on file  Social History Narrative   Not on file   Social Determinants of Health   Financial Resource Strain: Low Risk    Difficulty of Paying Living Expenses: Not hard at all  Food Insecurity: No Food Insecurity   Worried About Charity fundraiser in the Last Year: Never true   [place] in the Last Year: Never true  Transportation Needs: No Transportation Needs   Lack of Transportation (Medical): No   Lack of Transportation (Non-Medical): No  Physical Activity: Insufficiently Active   Days of Exercise per Week: 3 days   Minutes of Exercise per Session: 40 min  Stress: Stress Concern Present   Feeling of Stress : Very much  Social Connections: Moderately Integrated   Frequency of Communication with Friends and Family: Three times a week   Frequency of Social Gatherings with Friends and Family: Three times a week   Attends Religious Services: Never   Active Member of Clubs or Organizations: Yes   Attends Music therapist: More than 4 times per year   Marital Status: Married  Human resources officer Violence: Not At Risk   Fear of Current or Ex-Partner: No   Emotionally Abused: No   Physically Abused: No   Sexually Abused: No    Past  Medical History, Surgical history, Social history, and Family history were reviewed and updated as appropriate.   Please see review of systems for further details on the patient's review from today.   Objective:   Physical Exam:  There were no vitals taken for this visit.  Physical Exam Neurological:     Mental Status: He is alert and oriented to person, place, and time.     Cranial Nerves: No dysarthria.  Psychiatric:        Attention and Perception: Attention and perception normal.        Mood and Affect: Mood is anxious and depressed.        Speech: Speech normal.        Behavior: Behavior is cooperative.        Thought Content: Thought content normal. Thought content is not paranoid or delusional. Thought content does not include homicidal or suicidal ideation. Thought content does not include homicidal or suicidal plan.        Cognition and Memory: Cognition and memory normal.        Judgment: Judgment normal.     Comments: Insight intact    Lab Review:     Component Value Date/Time   NA 139 11/30/2020 0738   K 4.3 11/30/2020 0738   CL 101 11/30/2020 0738   CO2 28 11/30/2020 0738   GLUCOSE 96 11/30/2020 0738   BUN 17 11/30/2020 0738   CREATININE 1.05 11/30/2020 0738   CREATININE 0.96 05/14/2020 0806   CALCIUM 9.3 11/30/2020 0738   PROT 7.0 04/08/2021 0708   ALBUMIN 4.5 04/08/2021 0708   AST 16 04/08/2021 0708   ALT 17 04/08/2021 0708   ALKPHOS 57 04/08/2021 0708   BILITOT 0.5 04/08/2021 0708   GFRNONAA 87 05/14/2020 0806   GFRAA 101 05/14/2020 0806       Component Value Date/Time   WBC 7.6 04/08/2021 0708   RBC 4.88 04/08/2021 0708   HGB 15.2 04/08/2021 0708   HCT 45.0 04/08/2021 0708   PLT 214.0 04/08/2021 0708   MCV 92.1 04/08/2021 0708   MCH 29.2 05/14/2020 0806   MCHC 33.9 04/08/2021 0708   RDW 12.4  04/08/2021 0708   LYMPHSABS 1.7 11/30/2020 0738   MONOABS 0.4 11/30/2020 0738   EOSABS 0.1 11/30/2020 0738   BASOSABS 0.0 11/30/2020 0738    No  results found for: POCLITH, LITHIUM   Lab Results  Component Value Date   VALPROATE 33.1 (L) 07/15/2019     .res Assessment: Plan:   Pt seen for 30 minutes and time spent discussing treatment options and reviewing pharmacogenetic testing results and implications for treatment. He reports that he has had benefit from [place] and that initial restlessness/akathisia has resolved. Discussed potential benefits, risks, and side effects of increasing Vraylar to improve mood and possibly anxiety since this may be exacerbated by racing thoughts. Pt is in agreement with trial of increase in Vraylar. Discussed that due to Vraylar's long half-life Vraylar can be dosed every other day.  Recommend gradual dose increase to minimize risk of akathisia by taking Vraylar 1.5 mg every other day alternating with Vraylar 3 mg every other day.  Pt to follow-up in 4-6 weeks or sooner if clinically indicated.  Patient advised to contact office with any questions, adverse effects, or acute worsening in signs and symptoms.   Kyri was seen today for anxiety and depression.  Diagnoses and all orders for this visit:  Bipolar II disorder ([place]) -     VRAYLAR 1.5 MG capsule; Take 1 capsule (1.5 mg total) by mouth daily. -     cariprazine (VRAYLAR) 3 MG capsule; Take 1 capsule (3 mg total) by mouth every other day. Alternating with 1.5 mg every other day    Please see After Visit Summary for patient specific instructions.  Future Appointments  Date Time Provider [place]  08/12/2021 11:30 AM Thayer Headings, PMHNP [facility] None    No orders of the defined types were placed in this encounter.     -------------------------------

## 2021-07-08 ENCOUNTER — Telehealth: Payer: Self-pay | Admitting: Physician Assistant

## 2021-07-08 NOTE — Telephone Encounter (Signed)
Pt stated at the last visit you increased Seroquel.It's too strong of a dose and he is requesting the med that you discussed similar to Seroquel.

## 2021-07-08 NOTE — Telephone Encounter (Signed)
Pt LVM asking Barnett Applebaum if she could implement plan B and prescribe something similar to Seroquel but not as strong.   Next appt 12/12

## 2021-07-09 NOTE — Telephone Encounter (Signed)
Pt clarified it's vraylar.He stated his anxiety has increased tremendously.He is also restless.He stated no other side effects.

## 2021-07-09 NOTE — Telephone Encounter (Signed)
LVM to rtc 

## 2021-07-10 NOTE — Telephone Encounter (Signed)
Pt informed

## 2021-07-12 ENCOUNTER — Telehealth: Payer: Self-pay | Admitting: Psychiatry

## 2021-07-12 DIAGNOSIS — F410 Panic disorder [episodic paroxysmal anxiety] without agoraphobia: Secondary | ICD-10-CM

## 2021-07-12 MED ORDER — CLONAZEPAM 0.5 MG PO TABS: ORAL_TABLET | ORAL | 1 refills | Status: DC

## 2021-07-12 NOTE — Telephone Encounter (Signed)
Called patient to get more information. Patient said he had a "quite severe" panic attack about 8:30 this morning. He stated it lasted about 30 minutes. He went for a walk around the block and then showered and was finally able to calm himself. I saw the note from earlier this week about decreasing the Vyalar. He stated he had done that. I also reinforced the recommendation to allow anxiety and restlessness to settle before adding another medication. He states he understands that but feels you need to be aware of this panic attack. He states he has no other medication to fall back on.   Any recommendations for him?

## 2021-07-12 NOTE — Telephone Encounter (Signed)
Returned call to pt. He reports that he had worsening anxiety and restlessness with increase in [place]. He decreased Vraylar   He reports that he had a severe panic attack this morning similar to a few severe panic attacks.   Hold Vraylar 1.5 mg for 2-3 days until restlessness resolves.   Discussed potential benefits, risk, and side effects of benzodiazepines to include potential risk of tolerance and dependence, as well as possible drowsiness.  Advised patient not to drive if experiencing drowsiness and to take lowest possible effective dose to minimize risk of dependence and tolerance.  Will start Klonopin 0.5 mg 1/2-1 tab po qd prn panic. Advised pt to take Klonopin today to help residual anxiety and restlessness.   Will follow-up in approximately 3 days.   Patient advised to contact office with any questions, adverse effects, or acute worsening in signs and symptoms.

## 2021-07-12 NOTE — Telephone Encounter (Signed)
Patient Chase Hunter stating he had a very bad panic attack this am. He is inquiring if there is any medication to be had to assist with this dilemma before his scheduled appointment on 12/12. Direct call to # [phone].

## 2021-07-15 ENCOUNTER — Telehealth: Payer: Self-pay | Admitting: Psychiatry

## 2021-07-15 DIAGNOSIS — F3181 Bipolar II disorder: Secondary | ICD-10-CM

## 2021-07-15 MED ORDER — ASENAPINE MALEATE 2.5 MG SL SUBL: 2.5000 mg | SUBLINGUAL_TABLET | Freq: Every day | SUBLINGUAL | 0 refills | Status: DC

## 2021-07-15 NOTE — Telephone Encounter (Signed)
Called pt to follow-up and he reports that he had a few more panic attacks and that Klonopin has helped with panic. Anxiety remains elevated.   "The restlessness is much subsided" after not taking Vraylar for 3 days.   Discussed potential benefits, risks, and side effects of Saphris. Pt agrees to trial of Saphris. Will send script to pharmacy for Saphris 2.5 mg SL QHS. Will continue to hold Vraylar. Continue Klonopin prn anxiety.   Requested pt call office later in the week to provide update on how he is doing.   Patient advised to contact office with any questions, adverse effects, or acute worsening in signs and symptoms.

## 2021-07-16 ENCOUNTER — Other Ambulatory Visit: Payer: Self-pay | Admitting: Psychiatry

## 2021-07-16 DIAGNOSIS — F3181 Bipolar II disorder: Secondary | ICD-10-CM

## 2021-07-16 NOTE — Telephone Encounter (Signed)
Please see pharmacy note.  

## 2021-07-17 ENCOUNTER — Telehealth: Payer: Self-pay | Admitting: Psychiatry

## 2021-07-17 NOTE — Telephone Encounter (Signed)
I called pharmacy. They received an Rx and started a PA through Cover My Meds. She stated a new Rx was sent with a corrected quantity and she started a new PA through Cover My Meds and should be able to see PA there. I don't know if we need to do anything further at this time. They did say this is a special order meds and if Rx sent M-Thu they could usually get it the next day. I will call patient and let him know what the issue is.

## 2021-07-17 NOTE — Telephone Encounter (Signed)
Chase Hunter I am working on this. It still needed a PA. Sorry you got the message too.

## 2021-07-17 NOTE — Telephone Encounter (Signed)
Pt LVM stating his pharmacy hasn't been able to get Asenapine script request to [place] for the last 24 hours.  Looks like to me it was received by the pharmacy.  Pls call to see if there is some other issue.

## 2021-07-18 NOTE — Telephone Encounter (Signed)
Prior Approval received for Asenapine Sublingual Tablets effective 07/18/2021-07/18/2024  with [facility].  [place] aware to contact his pharmacy.

## 2021-07-19 NOTE — Telephone Encounter (Signed)
Chase Hunter, would you also please contact the pt to make sure he is aware the PA was approved so he can pick up med before the weekend if he has not already?

## 2021-07-19 NOTE — Telephone Encounter (Signed)
Notified patient that PA had been approved. The pharmacy had notified him as well.

## 2021-07-24 ENCOUNTER — Encounter: Payer: Self-pay | Admitting: Psychiatry

## 2021-07-24 ENCOUNTER — Telehealth (INDEPENDENT_AMBULATORY_CARE_PROVIDER_SITE_OTHER): Payer: Medicare Other | Admitting: Psychiatry

## 2021-07-24 DIAGNOSIS — F3181 Bipolar II disorder: Secondary | ICD-10-CM | POA: Diagnosis not present

## 2021-07-24 DIAGNOSIS — F410 Panic disorder [episodic paroxysmal anxiety] without agoraphobia: Secondary | ICD-10-CM

## 2021-07-24 DIAGNOSIS — F411 Generalized anxiety disorder: Secondary | ICD-10-CM | POA: Diagnosis not present

## 2021-07-24 MED ORDER — ASENAPINE MALEATE 5 MG SL SUBL: 5.0000 mg | SUBLINGUAL_TABLET | Freq: Every day | SUBLINGUAL | 0 refills | Status: DC

## 2021-07-24 NOTE — Progress Notes (Signed)
Chase Hunter Chase Hunter [id number] [birth year] 60 y.o.  Virtual Visit via Video Note  I connected with pt @ on 07/24/21 at  9:00 AM EST by a video enabled telemedicine application and verified that I am speaking with the correct person using two identifiers.   I discussed the limitations of evaluation and management by telemedicine and the availability of in person appointments. The patient expressed understanding and agreed to proceed.  I discussed the assessment and treatment plan with the patient. The patient was provided an opportunity to ask questions and all were answered. The patient agreed with the plan and demonstrated an understanding of the instructions.   The patient was advised to call back or seek an in-person evaluation if the symptoms worsen or if the condition fails to improve as anticipated.  I provided 25 minutes of non-face-to-face time during this encounter.  The patient was located at home.  The provider was located at [place].   Thayer Headings, PMHNP   Subjective:   Patient ID:  Chase Axel. is a 60 y.o. (DOB [birth year]) male.  Chief Complaint:  Chief Complaint  Patient presents with   Medication Problem    Akathisia   Anxiety    HPI Chase Hunter. presents for follow-up of anxiety and mood disturbance. He reports that he has had a few more panic attacks and has used Klonopin prn. Last panic attack was 2 days ago and reports that this was a severe panic attack. Has taken Saphris for 5 nights. BP has been stable.   Stopped Vraylar about a week ago. Physical restlessness has resolved in the last few days.   He reports that Saphris has "helped a little bit." He reports that anxiety seems to improving and intrusive thoughts have been less. He reports that being off Vraylar has helped restlessness.   He reports that "racing mind" has been controlled. He reports that he has felt "raw" with transition in meds.   He reports that  he sleeps well and jolts awake at 5 am. Denies excessive somnolence. He reports that energy has been at a good level. Motivation has been ok. Appetite has been slightly decreased. Concentration has been the same.  Denies any impulsivity or risky behavior. Denies depressed mood.   Denies SI. Some occ intrusive thoughts.   Past Psychiatric Medication Trials: Lamictal- Rash. Had seizure when it was stopped.  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 Abilify- cannot recall response. Minimal improvement. Seroquel-Affective dulling, excessive somnolence. Took up to 600 mg QHS Latuda Trazodone- Increased BP and suicidal thoughts Propranolol Sertraline Lexapro Wellbutrin Duloxetine-excessively talkative. "Saying things out loud that I thought I was just thinking."  Buspar-Increased anxiety and increased BP.    Review of Systems:  Review of Systems  Cardiovascular:        He reports that BP is controlled  Musculoskeletal:  Negative for gait problem.  Neurological:  Negative for tremors.  Psychiatric/Behavioral:         Please refer to HPI   Medications: I have reviewed the patient's current medications.  Current Outpatient Medications  Medication Sig Dispense Refill   [START ON 07/31/2021] asenapine (SAPHRIS) 5 MG SUBL 24 hr tablet Place 1 tablet (5 mg total) under the tongue at bedtime. 30 tablet 0   albuterol (PROVENTIL) (2.5 MG/3ML) 0.083% nebulizer solution Take 2.5 mg by nebulization every 6 (six) hours as needed for wheezing or shortness of breath.  albuterol (VENTOLIN HFA) 108 (90 Base) MCG/ACT inhaler Inhale 2 puffs into the lungs every 6 (six) hours as needed for wheezing or shortness of breath.      Asenapine Maleate (SAPHRIS) 2.5 MG SUBL Place 1 tablet (2.5 mg total) under the tongue at bedtime. 60 tablet 0   carvedilol (COREG) 3.125 MG tablet TAKE 1 TABLET (3.125 MG TOTAL) BY MOUTH 2 (TWO) TIMES  DAILY WITH A MEAL. (Patient not taking: Reported on 06/28/2021) 180 tablet 1   Cholecalciferol (VITAMIN D3) 125 MCG (5000 UT) TABS Take 5,000 Units by mouth daily.      clonazePAM (KLONOPIN) 0.5 MG tablet Take 1/2-1 tab po qd prn anxiety and panic 15 tablet 1   fluticasone (FLONASE) 50 MCG/ACT nasal spray Place into both nostrils daily.     fluticasone-salmeterol (ADVAIR) 250-50 MCG/ACT AEPB Inhale 1 puff into the lungs in the morning and at bedtime.     INSULIN SYRINGE 1CC/29G (EXEL COMFORT POINT INSULIN SYR) 29G X 1/2" 1 ML MISC Inject testosterone 0.5 ml weekly 100 each 2   L-Methylfolate-Algae (DEPLIN 15 PO) Take 15 mg by mouth daily.     lisinopril (ZESTRIL) 40 MG tablet Take 1 tablet (40 mg total) by mouth daily. 90 tablet 2   Omega-3 Fatty Acids (FISH OIL) 1200 MG CAPS Take 1,200 mg by mouth daily.      testosterone cypionate (DEPOTESTOSTERONE CYPIONATE) 200 MG/ML injection Inject 0.5 mLs (100 mg total) into the muscle once a week. 2.5 mL 3   Vitamins-Lipotropics (LIPOFLAVONOID PO) Take by mouth.     No current facility-administered medications for this visit.    Medication Side Effects: None  Allergies:  Allergies  Allergen Reactions   Cephalexin Hives   Depakote [Divalproex Sodium]     Caused Pancreatitis   Trazodone And Nefazodone     Suicidal thoughts   Lamictal [Lamotrigine] Rash    Had a seizure when he stopped it   Lipitor [Atorvastatin] Rash    Other reaction(s): Other (See Comments) Memory issues that have not resolved    Past Medical History:  Diagnosis Date   Allergy    Anemia    Asthma    Bipolar II disorder ([place])    Depression    Gallstones    GERD (gastroesophageal reflux disease)    Hx of adenomatous colonic polyps    Hyperlipidemia    Hypertension    Pancreatitis    Seizures (HCC)    past hx 6 yrs ago x 1- coming off meds caused 1 seizure     Family History  Problem Relation Age of Onset   Atrial fibrillation Mother    Anxiety disorder Mother     Obesity Mother    Mood Disorder Mother    Anxiety disorder Father    Prostate cancer Father    Brain cancer Father    Anxiety disorder Brother    Anxiety disorder Daughter    Colon cancer Neg Hx    Colon polyps Neg Hx    Esophageal cancer Neg Hx    Rectal cancer Neg Hx    Stomach cancer Neg Hx     Social History   Socioeconomic History   Marital status: Married    Spouse name: Not on file   Number of children: 1   Years of education: Not on file   Highest education level: Not on file  Occupational History   Occupation: retired Pharmacist, hospital  Tobacco Use   Smoking status: Never   Smokeless tobacco: Never  Substance and Sexual Activity   Alcohol use: Never   Drug use: Not Currently   Sexual activity: Not on file  Other Topics Concern   Not on file  Social History Narrative   Not on file   Social Determinants of Health   Financial Resource Strain: Low Risk    Difficulty of Paying Living Expenses: Not hard at all  Food Insecurity: No Food Insecurity   Worried About Running Out of Food in the Last Year: Never true   [place] in the Last Year: Never true  Transportation Needs: No Transportation Needs   Lack of Transportation (Medical): No   Lack of Transportation (Non-Medical): No  Physical Activity: Insufficiently Active   Days of Exercise per Week: 3 days   Minutes of Exercise per Session: 40 min  Stress: Stress Concern Present   Feeling of Stress : Very much  Social Connections: Moderately Integrated   Frequency of Communication with Friends and Family: Three times a week   Frequency of Social Gatherings with Friends and Family: Three times a week   Attends Religious Services: Never   Active Member of Clubs or Organizations: Yes   Attends Music therapist: More than 4 times per year   Marital Status: Married  Human resources officer Violence: Not At Risk   Fear of Current or Ex-Partner: No   Emotionally Abused: No   Physically Abused: No    Sexually Abused: No    Past Medical History, Surgical history, Social history, and Family history were reviewed and updated as appropriate.   Please see review of systems for further details on the patient's review from today.   Objective:   Physical Exam:  There were no vitals taken for this visit.  Physical Exam Neurological:     Mental Status: He is alert and oriented to person, place, and time.     Cranial Nerves: No dysarthria.  Psychiatric:        Attention and Perception: Attention and perception normal.        Mood and Affect: Mood is anxious. Mood is not depressed.        Speech: Speech normal.        Behavior: Behavior is cooperative.        Thought Content: Thought content normal. Thought content is not paranoid or delusional. Thought content does not include homicidal or suicidal ideation. Thought content does not include homicidal or suicidal plan.        Cognition and Memory: Cognition and memory normal.        Judgment: Judgment normal.     Comments: Insight intact    Lab Review:     Component Value Date/Time   NA 139 11/30/2020 0738   K 4.3 11/30/2020 0738   CL 101 11/30/2020 0738   CO2 28 11/30/2020 0738   GLUCOSE 96 11/30/2020 0738   BUN 17 11/30/2020 0738   CREATININE 1.05 11/30/2020 0738   CREATININE 0.96 05/14/2020 0806   CALCIUM 9.3 11/30/2020 0738   PROT 7.0 04/08/2021 0708   ALBUMIN 4.5 04/08/2021 0708   AST 16 04/08/2021 0708   ALT 17 04/08/2021 0708   ALKPHOS 57 04/08/2021 0708   BILITOT 0.5 04/08/2021 0708   GFRNONAA 87 05/14/2020 0806   GFRAA 101 05/14/2020 0806       Component Value Date/Time   WBC 7.6 04/08/2021 0708   RBC 4.88 04/08/2021 0708   HGB 15.2 04/08/2021 0708   HCT 45.0 04/08/2021 0708   PLT  214.0 04/08/2021 0708   MCV 92.1 04/08/2021 0708   MCH 29.2 05/14/2020 0806   MCHC 33.9 04/08/2021 0708   RDW 12.4 04/08/2021 0708   LYMPHSABS 1.7 11/30/2020 0738   MONOABS 0.4 11/30/2020 0738   EOSABS 0.1 11/30/2020 0738    BASOSABS 0.0 11/30/2020 0738    No results found for: POCLITH, LITHIUM   Lab Results  Component Value Date   VALPROATE 33.1 (L) 07/15/2019     .res Assessment: Plan:    Discussed response to recent initiation of Saphris and he reports that this has been helpful for his mood and anxiety s/s. He reports that he is tolerating Saphris without difficulty and has not experienced an elevated in BP that has occurred with other medications. He reports that physical restlessness resolved a few days ago after stopping Vraylar. Discussed that Chase Hunter was likely causing akathisia and that Chase Hunter can take a few weeks to completely wash out and therefore recommend slow titration of Saphris since it could have a cumulative effect of causing akathisia with residual Vraylar. Discussed plan and agreed to continue Saphris 2.5 mg SL QHS for one week, then increase to 5 mg SL at bedtime to further improve mood s/s. Discussed that Saphris is not indicated for anxiety but can have a calming effect.  Will continue Klonopin prn for panic and severe anxiety.  Will follow-up on 08/12/21 as planned.  Patient advised to contact office with any questions, adverse effects, or acute worsening in signs and symptoms.   Chase Hunter was seen today for medication problem and anxiety.  Diagnoses and all orders for this visit:  Bipolar II disorder (HCC) -     asenapine (SAPHRIS) 5 MG SUBL 24 hr tablet; Place 1 tablet (5 mg total) under the tongue at bedtime.    Please see After Visit Summary for patient specific instructions.  Future Appointments  Date Time Provider [place]  08/12/2021 11:30 AM Thayer Headings, PMHNP CP-CP None    No orders of the defined types were placed in this encounter.     -------------------------------

## 2021-08-12 ENCOUNTER — Ambulatory Visit (INDEPENDENT_AMBULATORY_CARE_PROVIDER_SITE_OTHER): Payer: Medicare Other | Admitting: Psychiatry

## 2021-08-12 ENCOUNTER — Other Ambulatory Visit: Payer: Self-pay

## 2021-08-12 ENCOUNTER — Encounter: Payer: Self-pay | Admitting: Psychiatry

## 2021-08-12 DIAGNOSIS — F3181 Bipolar II disorder: Secondary | ICD-10-CM | POA: Diagnosis not present

## 2021-08-12 DIAGNOSIS — F410 Panic disorder [episodic paroxysmal anxiety] without agoraphobia: Secondary | ICD-10-CM | POA: Diagnosis not present

## 2021-08-12 MED ORDER — QUETIAPINE FUMARATE ER 300 MG PO TB24: 300.0000 mg | ORAL_TABLET | Freq: Every day | ORAL | 1 refills | Status: DC

## 2021-08-12 MED ORDER — QUETIAPINE FUMARATE ER 50 MG PO TB24: ORAL_TABLET | ORAL | 0 refills | Status: DC

## 2021-08-12 MED ORDER — CLONAZEPAM 0.5 MG PO TABS: ORAL_TABLET | ORAL | 1 refills | Status: DC

## 2021-08-12 NOTE — Progress Notes (Signed)
Chase Hunter Chase Hunter [id number] [birth year] 60 y.o.  Subjective:   Patient ID:  Chase Goodin. is a 60 y.o. (DOB Jan 10, 1961) male.  Chief Complaint:  Chief Complaint  Patient presents with   Depression   Anxiety     HPI Chase Hunter. presents to the office today for follow-up of anxiety and mood disturbance. He reports that about a week ago after increasing Saphris he had SI and crying episodes 3-4 times a day without apparent trigger. He decided to stop taking Saphris on Friday. He reports that he made arrangements for either his wife or daughter to be with him. He reports that SI has significantly improved. Contracts for safety.He reports crying episodes have resolved. "I'm in survival mode... not even focused on mood.  Denies panic. Denies restlessness. Sleeping well. No appetite and he is continuing to eat. Energy and motivation have been low. He is able to concentrate to read. Denies racing thoughts. Denies any racing thoughts.   He reports that he and his wife discussed response to meds and agree that he should re-start Seroquel. "I felt content" on Seroquel. Denies having anxiety on seroquel and mood was stable. He was on Seroquel 6 years. He took 400 mg dose for the majority of the time and took doses up to 600 mg.   Past Psychiatric Medication Trials: Lamictal- Rash. Had seizure when it was stopped.  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 Abilify- cannot recall response. Minimal improvement. Seroquel-Affective dulling, excessive somnolence. Took up to 600 mg QHS Saphris- SI, uncontrolled crying Latuda Trazodone- Increased BP and suicidal thoughts Propranolol Sertraline Lexapro Wellbutrin Duloxetine-excessively talkative. "Saying things out loud that I thought I was just thinking."  Buspar-Increased anxiety and increased BP.   AIMS    [facility] Office Visit from  08/12/2021 in [place] Total Score 0      PHQ2-9    [place] from 06/14/2021 in [place] [facility] [place] from 06/13/2020 in [place] at [place]  PHQ-2 Total Score 2 0  PHQ-9 Total Score 12 --      Flowsheet Row Clinical Support from 06/14/2021 in [place] at [place] Error: Q3, 4, or 5 should not be populated when Q2 is No        Review of Systems:  Review of Systems  Musculoskeletal:  Negative for gait problem.  Neurological:  Negative for tremors.  Psychiatric/Behavioral:         Please refer to HPI   Medications: I have reviewed the patient's current medications.  Current Outpatient Medications  Medication Sig Dispense Refill   Cholecalciferol (VITAMIN D3) 125 MCG (5000 UT) TABS Take 5,000 Units by mouth daily.      fluticasone-salmeterol (ADVAIR) 250-50 MCG/ACT AEPB Inhale 1 puff into the lungs in the morning and at bedtime.     L-Methylfolate-Algae (DEPLIN 15 PO) Take 15 mg by mouth daily.     lisinopril (ZESTRIL) 40 MG tablet Take 1 tablet (40 mg total) by mouth daily. 90 tablet 2   Omega-3 Fatty Acids (FISH OIL) 1200 MG CAPS Take 1,200 mg by mouth daily.      QUEtiapine (SEROQUEL XR) 300 MG 24 hr tablet Take 1 tablet (300 mg total) by mouth at bedtime. 30 tablet 1   QUEtiapine (SEROQUEL XR) 50 MG TB24 24 hr tablet Take 1 tab every evening for 2 days, then  2 tab every evening for 2 days, then 3 tablets every evening for 2 days, then 4 tablets every evening for 2 days, then 6 tabs every evening 35 tablet 0   testosterone cypionate (DEPOTESTOSTERONE CYPIONATE) 200 MG/ML injection Inject 0.5 mLs (100 mg total) into the muscle once a week. 2.5 mL 3   Vitamins-Lipotropics (LIPOFLAVONOID PO) Take by mouth.     albuterol (PROVENTIL) (2.5 MG/3ML) 0.083% nebulizer solution Take 2.5 mg by nebulization every 6 (six) hours as needed for wheezing or shortness of breath.       albuterol (VENTOLIN HFA) 108 (90 Base) MCG/ACT inhaler Inhale 2 puffs into the lungs every 6 (six) hours as needed for wheezing or shortness of breath.      carvedilol (COREG) 3.125 MG tablet TAKE 1 TABLET (3.125 MG TOTAL) BY MOUTH 2 (TWO) TIMES DAILY WITH A MEAL. (Patient not taking: Reported on 06/28/2021) 180 tablet 1   clonazePAM (KLONOPIN) 0.5 MG tablet Take 1/2-1 tab po qd prn anxiety and panic 15 tablet 1   fluticasone (FLONASE) 50 MCG/ACT nasal spray Place into both nostrils daily as needed.     INSULIN SYRINGE 1CC/29G (EXEL COMFORT POINT INSULIN SYR) 29G X 1/2" 1 ML MISC Inject testosterone 0.5 ml weekly 100 each 2   No current facility-administered medications for this visit.    Medication Side Effects: Other: SI, Uncontrolled crying.  Allergies:  Allergies  Allergen Reactions   Cephalexin Hives   Depakote [Divalproex Sodium]     Caused Pancreatitis   Trazodone And Nefazodone     Suicidal thoughts   Lamictal [Lamotrigine] Rash    Had a seizure when he stopped it   Lipitor [Atorvastatin] Rash    Other reaction(s): Other (See Comments) Memory issues that have not resolved    Past Medical History:  Diagnosis Date   Allergy    Anemia    Asthma    Bipolar II disorder ([place])    Depression    Gallstones    GERD (gastroesophageal reflux disease)    Hx of adenomatous colonic polyps    Hyperlipidemia    Hypertension    Pancreatitis    Seizures (HCC)    past hx 6 yrs ago x 1- coming off meds caused 1 seizure     Past Medical History, Surgical history, Social history, and Family history were reviewed and updated as appropriate.   Please see review of systems for further details on the patient's review from today.   Objective:   Physical Exam:  There were no vitals taken for this visit.  Physical Exam Constitutional:      General: He is not in acute distress. Musculoskeletal:        General: No deformity.  Neurological:     Mental Status: He is alert and  oriented to person, place, and time.     Coordination: Coordination normal.  Psychiatric:        Attention and Perception: Attention and perception normal. He does not perceive auditory or visual hallucinations.        Mood and Affect: Mood is anxious. Mood is not depressed. Affect is not labile, blunt, angry or inappropriate.        Speech: Speech normal.        Behavior: Behavior normal.        Thought Content: Thought content normal. Thought content is not paranoid or delusional. Thought content does not include homicidal or suicidal ideation. Thought content does not include homicidal or suicidal plan.  Cognition and Memory: Cognition and memory normal.        Judgment: Judgment normal.     Comments: Insight intact    Lab Review:     Component Value Date/Time   NA 139 11/30/2020 0738   K 4.3 11/30/2020 0738   CL 101 11/30/2020 0738   CO2 28 11/30/2020 0738   GLUCOSE 96 11/30/2020 0738   BUN 17 11/30/2020 0738   CREATININE 1.05 11/30/2020 0738   CREATININE 0.96 05/14/2020 0806   CALCIUM 9.3 11/30/2020 0738   PROT 7.0 04/08/2021 0708   ALBUMIN 4.5 04/08/2021 0708   AST 16 04/08/2021 0708   ALT 17 04/08/2021 0708   ALKPHOS 57 04/08/2021 0708   BILITOT 0.5 04/08/2021 0708   GFRNONAA 87 05/14/2020 0806   GFRAA 101 05/14/2020 0806       Component Value Date/Time   WBC 7.6 04/08/2021 0708   RBC 4.88 04/08/2021 0708   HGB 15.2 04/08/2021 0708   HCT 45.0 04/08/2021 0708   PLT 214.0 04/08/2021 0708   MCV 92.1 04/08/2021 0708   MCH 29.2 05/14/2020 0806   MCHC 33.9 04/08/2021 0708   RDW 12.4 04/08/2021 0708   LYMPHSABS 1.7 11/30/2020 0738   MONOABS 0.4 11/30/2020 0738   EOSABS 0.1 11/30/2020 0738   BASOSABS 0.0 11/30/2020 0738    No results found for: POCLITH, LITHIUM   Lab Results  Component Value Date   VALPROATE 33.1 (L) 07/15/2019     .res Assessment: Plan:    Pt seen for 30 minutes and time spent discussing recent adverse reactions to medications. He  reports that he would like to re-start Seroquel XR since it was the most effective for his mood and anxiety s/s and had the most tolerable side effects. Discussed gradually re-starting Seroquel to minimize risk for side effects. Will start Seroquel XR 50 mg one tablet po q evening for 2 days and continue to increase by one tablet every 2 days to target dose of 300 mg every evening.  Continue Klonopin 0.5 mg 1/2-1 tab po qd prn anxiety and panic.  Patient advised to contact office with any questions, adverse effects, or acute worsening in signs and symptoms.   Chase Hunter was seen today for depression and anxiety.  Diagnoses and all orders for this visit:  Bipolar II disorder ([place]) -     QUEtiapine (SEROQUEL XR) 50 MG TB24 24 hr tablet; Take 1 tab every evening for 2 days, then 2 tab every evening for 2 days, then 3 tablets every evening for 2 days, then 4 tablets every evening for 2 days, then 6 tabs every evening -     QUEtiapine (SEROQUEL XR) 300 MG 24 hr tablet; Take 1 tablet (300 mg total) by mouth at bedtime.  Panic -     clonazePAM (KLONOPIN) 0.5 MG tablet; Take 1/2-1 tab po qd prn anxiety and panic    Please see After Visit Summary for patient specific instructions.  Future Appointments  Date Time Provider [place]  09/05/2021  3:00 PM Thayer Headings, PMHNP CP-CP None    No orders of the defined types were placed in this encounter.   -------------------------------

## 2021-08-14 ENCOUNTER — Other Ambulatory Visit: Payer: Self-pay

## 2021-08-14 ENCOUNTER — Emergency Department (HOSPITAL_COMMUNITY)
Admission: EM | Admit: 2021-08-14 | Discharge: 2021-08-14 | Disposition: A | Discharge status: Home / Self Care | Payer: Medicare Other | Attending: Emergency Medicine | Admitting: Emergency Medicine

## 2021-08-14 ENCOUNTER — Emergency Department (HOSPITAL_COMMUNITY): Payer: Medicare Other

## 2021-08-14 ENCOUNTER — Encounter (HOSPITAL_COMMUNITY): Payer: Self-pay

## 2021-08-14 DIAGNOSIS — I10 Essential (primary) hypertension: Secondary | ICD-10-CM | POA: Diagnosis not present

## 2021-08-14 DIAGNOSIS — Z794 Long term (current) use of insulin: Secondary | ICD-10-CM | POA: Diagnosis not present

## 2021-08-14 DIAGNOSIS — J45909 Unspecified asthma, uncomplicated: Secondary | ICD-10-CM | POA: Insufficient documentation

## 2021-08-14 DIAGNOSIS — Z7952 Long term (current) use of systemic steroids: Secondary | ICD-10-CM | POA: Diagnosis not present

## 2021-08-14 LAB — CBC WITH DIFFERENTIAL/PLATELET
Abs Immature Granulocytes: 0.03 10*3/uL (ref 0.00–0.07)
Basophils Absolute: 0.1 10*3/uL (ref 0.0–0.1)
Basophils Relative: 1 %
Eosinophils Absolute: 0.1 10*3/uL (ref 0.0–0.5)
Eosinophils Relative: 1 %
HCT: 47.4 % (ref 39.0–52.0)
Hemoglobin: 15.9 g/dL (ref 13.0–17.0)
Immature Granulocytes: 0 %
Lymphocytes Relative: 19 %
Lymphs Abs: 1.9 10*3/uL (ref 0.7–4.0)
MCH: 30.7 pg (ref 26.0–34.0)
MCHC: 33.5 g/dL (ref 30.0–36.0)
MCV: 91.5 fL (ref 80.0–100.0)
Monocytes Absolute: 0.5 10*3/uL (ref 0.1–1.0)
Monocytes Relative: 5 %
Neutro Abs: 7.3 10*3/uL (ref 1.7–7.7)
Neutrophils Relative %: 74 %
Platelets: 203 10*3/uL (ref 150–400)
RBC: 5.18 MIL/uL (ref 4.22–5.81)
RDW: 12 % (ref 11.5–15.5)
WBC: 9.8 10*3/uL (ref 4.0–10.5)
nRBC: 0 % (ref 0.0–0.2)

## 2021-08-14 LAB — COMPREHENSIVE METABOLIC PANEL
ALT: 20 U/L (ref 0–44)
AST: 18 U/L (ref 15–41)
Albumin: 4.1 g/dL (ref 3.5–5.0)
Alkaline Phosphatase: 55 U/L (ref 38–126)
Anion gap: 8 (ref 5–15)
BUN: 15 mg/dL (ref 6–20)
CO2: 23 mmol/L (ref 22–32)
Calcium: 9.1 mg/dL (ref 8.9–10.3)
Chloride: 105 mmol/L (ref 98–111)
Creatinine, Ser: 0.87 mg/dL (ref 0.61–1.24)
GFR, Estimated: >60 mL/min (ref >60)
Glucose, Bld: 90 mg/dL (ref 70–99)
Potassium: 3.7 mmol/L (ref 3.5–5.1)
Sodium: 136 mmol/L (ref 135–145)
Total Bilirubin: 0.7 mg/dL (ref 0.3–1.2)
Total Protein: 7.2 g/dL (ref 6.5–8.1)

## 2021-08-14 LAB — TROPONIN I (HIGH SENSITIVITY): Troponin I (High Sensitivity): 5 ng/L (ref <18)

## 2021-08-14 IMAGING — CR DG CHEST 2V
2 series · 2 of 2 positions shown · non-contrast
Comparison: None.

CLINICAL DATA: Palpitations, hypertension

EXAM:
CHEST - 2 VIEW

[chest pa]
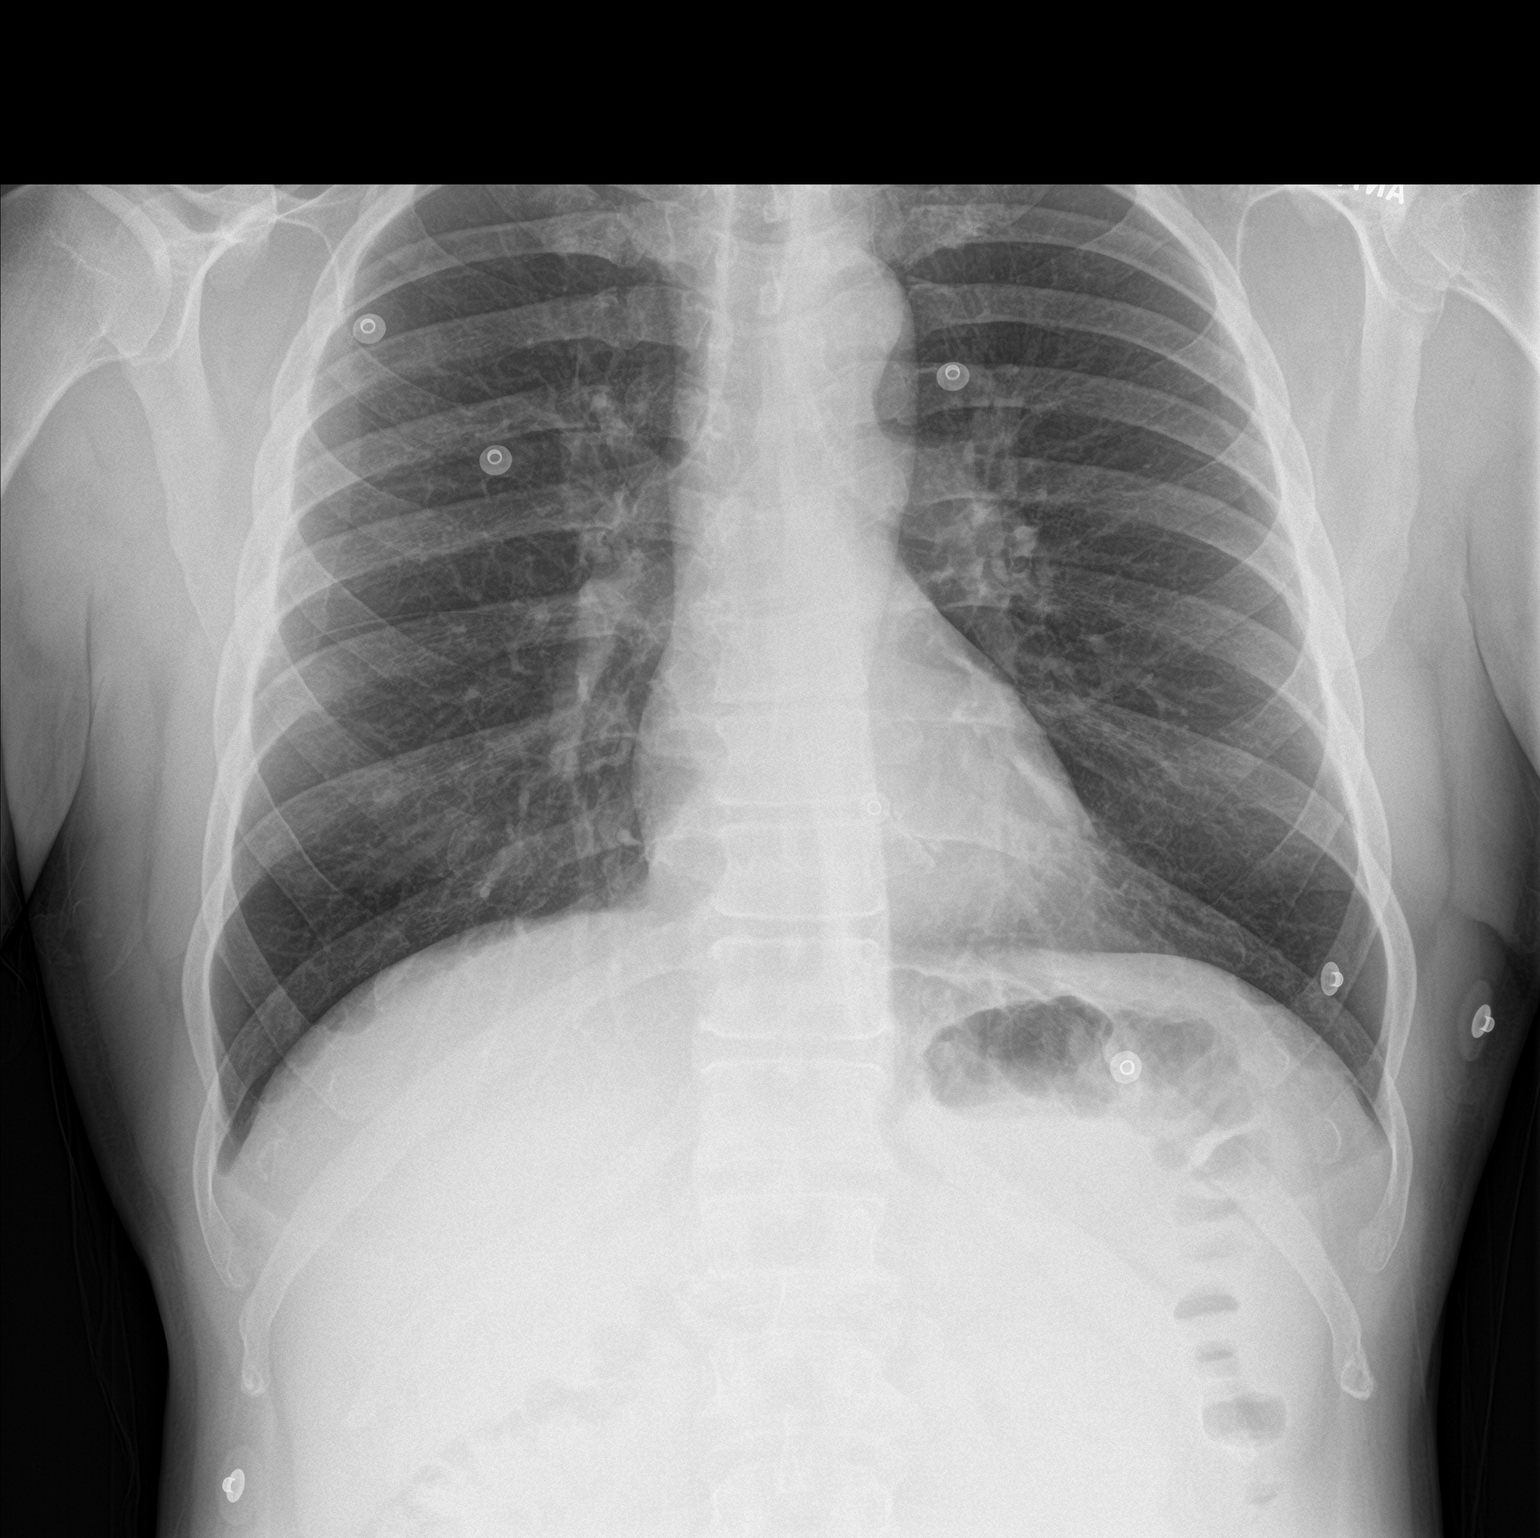

[chest lat]
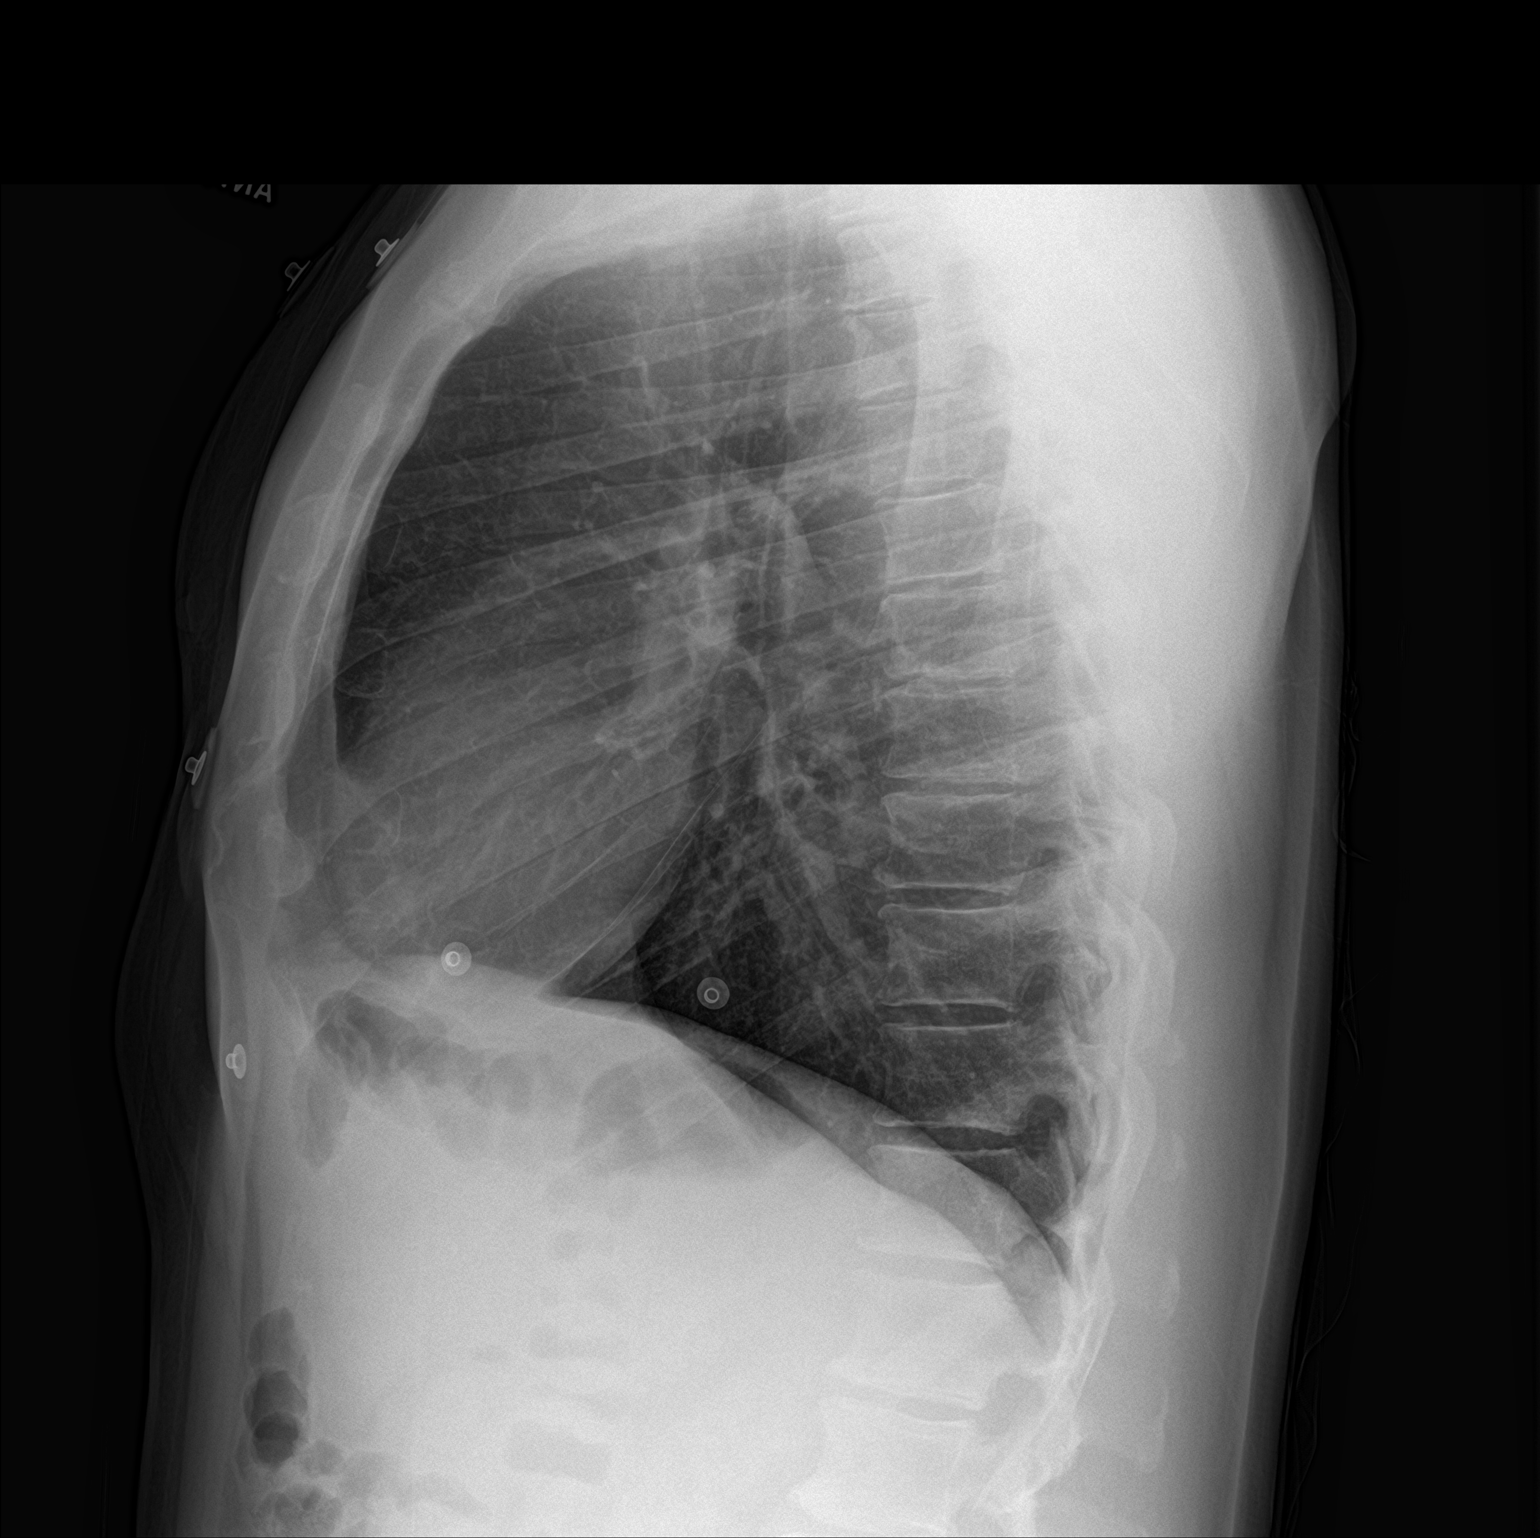

[2 of 2 positions shown; findings below may reference images not displayed]

FINDINGS: The heart size and mediastinal contours are within normal limits.
Both lungs are clear. The visualized skeletal structures are
unremarkable.
IMPRESSION: No acute abnormality of the lungs.

## 2021-08-14 NOTE — Discharge Instructions (Signed)
Continue taking home medications as prescribed.  Follow up with your primary care doctor for further blood pressure management.  Return to the ER with any new, worsening, or concerning symptoms.

## 2021-08-14 NOTE — ED Provider Notes (Signed)
Emergency Medicine Provider Triage Evaluation Note  Chase Hunter. , a 60 y.o. male  was evaluated in triage.  Pt complains of hypertension.  He states that when he woke up this morning he felt like his blood pressure was high.  He states that with that he has palpitations and feels unsettled.  He stopped taking his carvedilol "months ago."  He states that he Taking it due to sexual side effects.  He called his doctor and states he was told to come here.  He denies any fevers.  No chest pain however he does report palpitations.  He did take a dose of clonidine at home prior to arrival.  No headache.  No recent trauma.  Review of Systems  Positive: See above Negative:   Physical Exam  BP (!) 147/110    Pulse 80    Temp 98.8 F (37.1 C) (Oral)    Resp 16    SpO2 100%  Gen:   Awake, no distress   Resp:  Normal effort  MSK:   Moves extremities without difficulty  Other:  Normal speech.  Regular rate rhythm.  No murmurs.  Medical Decision Making  Medically screening exam initiated at 2:13 PM.  Appropriate orders placed.  Chase Hunter Chase Hunter. was informed that the remainder of the evaluation will be completed by another provider, this initial triage assessment does not replace that evaluation, and the importance of remaining in the ED until their evaluation is complete.  Patient presents today for evaluation of high blood pressure after he checked his blood pressure and felt unsettled, called his doctor, and came here. Given his reported palpitations will check EKG and chest x-ray.  Additionally will obtain lab work to check for endorgan damage. His blood pressure is slightly elevated here at 147/100, however he states that he did take a clonidine this morning which may be masking the hypertension.   [name], [place] 08/14/21 1415    [name], MD 08/16/21 [phone]

## 2021-08-14 NOTE — ED Triage Notes (Signed)
Patient was sent from his primary for HTN. Patient states that he knew his pressure was high, he can just tell. Denies chest pain, no SOB. Alert and oriented

## 2021-08-14 NOTE — ED Provider Notes (Signed)
[facility] EMERGENCY DEPARTMENT Provider Note   CSN: [id number] Arrival date & time: 08/14/21  1234     History No chief complaint on file.   Chase Hunter. is a 60 y.o. male palpitations and elevated blood pressure.  Patient states today he felt like his blood pressure was elevated, and we checked it was 155/108.  He felt like he was having palpitations and felt uneasy.  His company called his PCP, who sent him to the ER for evaluation.  He states since he has been waiting, his symptoms have completely resolved.  No headache, vision changes, slurred speech, numbness, chest pain, shortness breath, nausea, vomit, abdominal pain, change in urination or bowel movements.  He has a history of high blood pressure, is on lisinopril.  He was supposed be on carvedilol, but did not like the side effects and stopped it several weeks ago.  He has not followed up with his PCP since.  Additionally, there have been multiple changes to his psychiatric medications recently, which has been affecting his blood pressure.  HPI     Past Medical History:  Diagnosis Date   Allergy    Anemia    Asthma    Bipolar II disorder ([place])    Depression    Gallstones    GERD (gastroesophageal reflux disease)    Hx of adenomatous colonic polyps    Hyperlipidemia    Hypertension    Pancreatitis    Seizures ([place])    past hx 6 yrs ago x 1- coming off meds caused 1 seizure     Patient Active Problem List   Diagnosis Date Noted   Sinus tachycardia 11/30/2020   Acute pancreatitis without necrosis or infection, unspecified 04/25/2020   Hypogonadism in male 07/17/2019   Hypertension, essential, benign 07/17/2019   Bipolar II disorder ([place]) 07/17/2019   Hyperlipidemia 07/15/2019    Past Surgical History:  Procedure Laterality Date   COLONOSCOPY     ESOPHAGOGASTRODUODENOSCOPY N/A 07/19/2020   Procedure: ESOPHAGOGASTRODUODENOSCOPY (EGD);  Surgeon: Milus Banister, MD;  Location: [facility]  ENDOSCOPY;  Service: Endoscopy;  Laterality: N/A;   EUS N/A 07/19/2020   Procedure: UPPER ENDOSCOPIC ULTRASOUND (EUS) RADIAL;  Surgeon: Milus Banister, MD;  Location: [facility] ENDOSCOPY;  Service: Endoscopy;  Laterality: N/A;   POLYPECTOMY         Family History  Problem Relation Age of Onset   Atrial fibrillation Mother    Anxiety disorder Mother    Obesity Mother    Mood Disorder Mother    Anxiety disorder Father    Prostate cancer Father    Brain cancer Father    Anxiety disorder Brother    Anxiety disorder Daughter    Colon cancer Neg Hx    Colon polyps Neg Hx    Esophageal cancer Neg Hx    Rectal cancer Neg Hx    Stomach cancer Neg Hx     Social History   Tobacco Use   Smoking status: Never   Smokeless tobacco: Never  Substance Use Topics   Alcohol use: Never   Drug use: Not Currently    Home Medications Prior to Admission medications   Medication Sig Start Date End Date Taking? Authorizing Provider  albuterol (PROVENTIL) (2.5 MG/3ML) 0.083% nebulizer solution Take 2.5 mg by nebulization every 6 (six) hours as needed for wheezing or shortness of breath.     [provider]  albuterol (VENTOLIN HFA) 108 (90 Base) MCG/ACT inhaler Inhale 2 puffs into the lungs  every 6 (six) hours as needed for wheezing or shortness of breath.     [provider]  carvedilol (COREG) 3.125 MG tablet TAKE 1 TABLET (3.125 MG TOTAL) BY MOUTH 2 (TWO) TIMES DAILY WITH A MEAL. Patient not taking: Reported on 06/28/2021 06/03/21   [place], Betty G, MD  Cholecalciferol (VITAMIN D3) 125 MCG (5000 UT) TABS Take 5,000 Units by mouth daily.     [provider]  clonazePAM [facility] [place]) 0.5 MG tablet Take 1/2-1 tab po qd prn anxiety and panic 08/12/21   Thayer Headings, PMHNP  fluticasone [facility]) 50 MCG/ACT nasal spray Place into both nostrils daily as needed.    [provider]  fluticasone-salmeterol (ADVAIR) 250-50 MCG/ACT AEPB Inhale 1 puff into the lungs in the  morning and at bedtime.    [provider]  INSULIN SYRINGE 1CC/29G (EXEL COMFORT POINT INSULIN SYR) 29G X 1/2" 1 ML MISC Inject testosterone 0.5 ml weekly 10/18/19   [place], Betty G, MD  L-Methylfolate-Algae (DEPLIN 15 PO) Take 15 mg by mouth daily.    [provider]  lisinopril (ZESTRIL) 40 MG tablet Take 1 tablet (40 mg total) by mouth daily. 04/05/21   [place], Betty G, MD  Omega-3 Fatty Acids (FISH OIL) 1200 MG CAPS Take 1,200 mg by mouth daily.     [provider]  QUEtiapine (SEROQUEL XR) 300 MG 24 hr tablet Take 1 tablet (300 mg total) by mouth at bedtime. 08/12/21   Thayer Headings, PMHNP  QUEtiapine (SEROQUEL XR) 50 MG TB24 24 hr tablet Take 1 tab every evening for 2 days, then 2 tab every evening for 2 days, then 3 tablets every evening for 2 days, then 4 tablets every evening for 2 days, then 6 tabs every evening 08/12/21   Thayer Headings, PMHNP  testosterone cypionate (DEPOTESTOSTERONE CYPIONATE) 200 MG/ML injection Inject 0.5 mLs (100 mg total) into the muscle once a week. 04/07/21   [place], Betty G, MD  Vitamins-Lipotropics (LIPOFLAVONOID PO) Take by mouth.    [provider]    Allergies    Cephalexin, Depakote [divalproex sodium], Trazodone and nefazodone, Lamictal [lamotrigine], and Lipitor [atorvastatin]  Review of Systems   Review of Systems  Cardiovascular:  Positive for palpitations (resolved).  Psychiatric/Behavioral:  The patient is nervous/anxious.   All other systems reviewed and are negative.  Physical Exam Updated Vital Signs BP (!) 135/96    Pulse 79    Temp 98.8 F (37.1 C) (Oral)    Resp 18    SpO2 99%   Physical Exam Vitals and nursing note reviewed.  Constitutional:      General: He is not in acute distress.    Appearance: Normal appearance.     Comments: nontoxic  HENT:     Head: Normocephalic and atraumatic.  Eyes:     Conjunctiva/sclera: Conjunctivae normal.     Pupils: Pupils are equal, round, and reactive to  light.  Cardiovascular:     Rate and Rhythm: Normal rate and regular rhythm.     Pulses: Normal pulses.  Pulmonary:     Effort: Pulmonary effort is normal. No respiratory distress.     Breath sounds: Normal breath sounds. No wheezing.     Comments: Speaking in full sentences.  Clear lung sounds in all fields. Abdominal:     General: There is no distension.     Palpations: Abdomen is soft. There is no mass.     Tenderness: There is no abdominal tenderness. There is no guarding or rebound.  Musculoskeletal:        General: Normal range of motion.     Cervical back: Normal range of motion and neck supple.     Right lower leg: No edema.     Left lower leg: No edema.  Skin:    General: Skin is warm and dry.     Capillary Refill: Capillary refill takes less than 2 seconds.  Neurological:     General: No focal deficit present.     Mental Status: He is alert and oriented to person, place, and time.     GCS: GCS eye subscore is 4. GCS verbal subscore is 5. GCS motor subscore is 6.     Cranial Nerves: Cranial nerves 2-12 are intact.     Sensory: Sensation is intact.     Motor: Motor function is intact. No pronator drift.     Coordination: Coordination is intact. Finger-Nose-Finger Test normal.     Comments: No neuro deficit  Psychiatric:        Mood and Affect: Mood and affect normal.        Speech: Speech normal.        Behavior: Behavior normal.    ED Results / Procedures / Treatments   Labs (all labs ordered are listed, but only abnormal results are displayed) Labs Reviewed  COMPREHENSIVE METABOLIC PANEL  CBC WITH DIFFERENTIAL/PLATELET  TROPONIN I (HIGH SENSITIVITY)    EKG EKG Interpretation  Date/Time:  Wednesday August 14 2021 14:13:52 EST Ventricular Rate:  82 PR Interval:  148 QRS Duration: 84 QT Interval:  360 QTC Calculation: 420 R Axis:   80 Text Interpretation: Normal sinus rhythm Minimal voltage criteria for LVH, may be normal variant ( Sokolow-Lyon )  Nonspecific T wave inversions inferior/lateral leads, no prior ecg for comparison Confirmed by Octaviano Glow [id number]) on 08/14/2021 6:53:04 PM  Radiology DG Chest 2 View  Result Date: 08/14/2021 CLINICAL DATA:  Palpitations, hypertension EXAM: CHEST - 2 VIEW COMPARISON:  None. FINDINGS: The heart size and mediastinal contours are within normal limits. Both lungs are clear. The visualized skeletal structures are unremarkable. IMPRESSION: No acute abnormality of the lungs. Electronically Signed   By: Delanna Ahmadi M.D.   On: 08/14/2021 14:32    Procedures Procedures   Medications Ordered in ED Medications - No data to display  ED Course  I have reviewed the triage vital signs and the nursing notes.  Pertinent labs & imaging results that were available during my care of the patient were reviewed by me and considered in my medical decision making (see chart for details).    MDM Rules/Calculators/A&P                           Patient presenting for evaluation of palpitations and elevated blood pressure.  On my evaluation, palpitations have since resolved.  Blood pressure is also improved without intervention.  On exam, patient appears nontoxic.  No neurodeficits.  Labs obtained in triage overall reassuring.  No sign of endorgan damage.  Troponin and kidney function is normal.  Discussed findings with patient.  Discussed that at this time, there does not appear to be an acute life-threatening event requiring hospitalization.  Discussed the importance of close follow-up with PCP for further management of his blood pressure.  Discussed other ways to help manage his blood pressure including diet and managing anxiety.  At this time, patient appears safe for discharge.  Return precautions given.  Patient states he understands  and agrees to plan  Final Clinical Impression(s) / ED Diagnoses Final diagnoses:  Hypertension, unspecified type    Rx / DC Orders ED Discharge Orders     None         Franchot Heidelberg, PA-C 08/14/21 2054    Wyvonnia Dusky, MD 08/15/21 [phone]

## 2021-08-16 ENCOUNTER — Encounter: Payer: Self-pay | Admitting: Family Medicine

## 2021-08-16 ENCOUNTER — Ambulatory Visit (INDEPENDENT_AMBULATORY_CARE_PROVIDER_SITE_OTHER): Payer: Medicare Other | Admitting: Family Medicine

## 2021-08-16 VITALS — BP 138/90 | HR 88 | Resp 16 | Ht 69.0 in | Wt 176.0 lb

## 2021-08-16 DIAGNOSIS — R9431 Abnormal electrocardiogram [ECG] [EKG]: Secondary | ICD-10-CM | POA: Insufficient documentation

## 2021-08-16 DIAGNOSIS — I10 Essential (primary) hypertension: Secondary | ICD-10-CM

## 2021-08-16 MED ORDER — AMLODIPINE BESYLATE 5 MG PO TABS: 2.5000 mg | ORAL_TABLET | Freq: Every day | ORAL | 1 refills | Status: DC

## 2021-08-16 NOTE — Patient Instructions (Addendum)
A few things to remember from today's visit:  Hypertension, essential, benign  Abnormal EKG  If you need refills please call your pharmacy. Do not use My Chart to request refills or for acute issues that need immediate attention.   Today Amlodipine 5 mg 1/2 tab added at bedtime. No changes in Lisinopril. Let me know about blood pressure readings in 3-4 weeks. DASH Eating Plan DASH stands for Dietary Approaches to Stop Hypertension. The DASH eating plan is a healthy eating plan that has been shown to: Reduce high blood pressure (hypertension). Reduce your risk for type 2 diabetes, heart disease, and stroke. Help with weight loss. What are tips for following this plan? Reading food labels Check food labels for the amount of salt (sodium) per serving. Choose foods with less than 5 percent of the Daily Value of sodium. Generally, foods with less than 300 milligrams (mg) of sodium per serving fit into this eating plan. To find whole grains, look for the word "whole" as the first word in the ingredient list. Shopping Buy products labeled as "low-sodium" or "no salt added." Buy fresh foods. Avoid canned foods and pre-made or frozen meals. Cooking Avoid adding salt when cooking. Use salt-free seasonings or herbs instead of table salt or sea salt. Check with your health care provider or pharmacist before using salt substitutes. Do not fry foods. Cook foods using healthy methods such as baking, boiling, grilling, roasting, and broiling instead. Cook with heart-healthy oils, such as olive, canola, avocado, soybean, or sunflower oil. Meal planning  Eat a balanced diet that includes: 4 or more servings of fruits and 4 or more servings of vegetables each day. Try to fill one-half of your plate with fruits and vegetables. 6-8 servings of whole grains each day. Less than 6 oz (170 g) of lean meat, poultry, or fish each day. A 3-oz (85-g) serving of meat is about the same size as a deck of cards. One  egg equals 1 oz (28 g). 2-3 servings of low-fat dairy each day. One serving is 1 cup (237 mL). 1 serving of nuts, seeds, or beans 5 times each week. 2-3 servings of heart-healthy fats. Healthy fats called omega-3 fatty acids are found in foods such as walnuts, flaxseeds, fortified milks, and eggs. These fats are also found in cold-water fish, such as sardines, salmon, and mackerel. Limit how much you eat of: Canned or prepackaged foods. Food that is high in trans fat, such as some fried foods. Food that is high in saturated fat, such as fatty meat. Desserts and other sweets, sugary drinks, and other foods with added sugar. Full-fat dairy products. Do not salt foods before eating. Do not eat more than 4 egg yolks a week. Try to eat at least 2 vegetarian meals a week. Eat more home-cooked food and less restaurant, buffet, and fast food. Lifestyle When eating at a restaurant, ask that your food be prepared with less salt or no salt, if possible. If you drink alcohol: Limit how much you use to: 0-1 drink a day for women who are not pregnant. 0-2 drinks a day for men. Be aware of how much alcohol is in your drink. In the [place] one drink equals one 12 oz bottle of beer (355 mL), one 5 oz glass of wine (148 mL), or one 1 oz glass of hard liquor (44 mL). General information Avoid eating more than 2,300 mg of salt a day. If you have hypertension, you may need to reduce your sodium intake to  1,500 mg a day. Work with your health care provider to maintain a healthy body weight or to lose weight. Ask what an ideal weight is for you. Get at least 30 minutes of exercise that causes your heart to beat faster (aerobic exercise) most days of the week. Activities may include walking, swimming, or biking. Work with your health care provider or dietitian to adjust your eating plan to your individual calorie needs. What foods should I eat? Fruits All fresh, dried, or frozen fruit. Canned fruit in natural  juice (without added sugar). Vegetables Fresh or frozen vegetables (raw, steamed, roasted, or grilled). Low-sodium or reduced-sodium tomato and vegetable juice. Low-sodium or reduced-sodium tomato sauce and tomato paste. Low-sodium or reduced-sodium canned vegetables. Grains Whole-grain or whole-wheat bread. Whole-grain or whole-wheat pasta. Brown rice. [place]. Bulgur. Whole-grain and low-sodium cereals. Pita bread. Low-fat, low-sodium crackers. Whole-wheat flour tortillas. Meats and other proteins Skinless chicken or [place]. Ground chicken or [place]. Pork with fat trimmed off. Fish and seafood. Egg whites. Dried beans, peas, or lentils. Unsalted nuts, nut butters, and seeds. Unsalted canned beans. Lean cuts of beef with fat trimmed off. Low-sodium, lean precooked or cured meat, such as sausages or meat loaves. Dairy Low-fat (1%) or fat-free (skim) milk. Reduced-fat, low-fat, or fat-free cheeses. Nonfat, low-sodium ricotta or cottage cheese. Low-fat or nonfat yogurt. Low-fat, low-sodium cheese. Fats and oils Soft margarine without trans fats. Vegetable oil. Reduced-fat, low-fat, or light mayonnaise and salad dressings (reduced-sodium). Canola, safflower, olive, avocado, soybean, and sunflower oils. Avocado. Seasonings and condiments Herbs. Spices. Seasoning mixes without salt. Other foods Unsalted popcorn and pretzels. Fat-free sweets. The items listed above may not be a complete list of foods and beverages you can eat. Contact a dietitian for more information. What foods should I avoid? Fruits Canned fruit in a light or heavy syrup. Fried fruit. Fruit in cream or butter sauce. Vegetables Creamed or fried vegetables. Vegetables in a cheese sauce. Regular canned vegetables (not low-sodium or reduced-sodium). Regular canned tomato sauce and paste (not low-sodium or reduced-sodium). Regular tomato and vegetable juice (not low-sodium or reduced-sodium). Angie Fava. Olives. Grains Baked goods  made with fat, such as croissants, muffins, or some breads. Dry pasta or rice meal packs. Meats and other proteins Fatty cuts of meat. Ribs. Fried meat. Berniece Salines. Bologna, salami, and other precooked or cured meats, such as sausages or meat loaves. Fat from the back of a pig (fatback). Bratwurst. Salted nuts and seeds. Canned beans with added salt. Canned or smoked fish. Whole eggs or egg yolks. Chicken or [place] with skin. Dairy Whole or 2% milk, cream, and half-and-half. Whole or full-fat cream cheese. Whole-fat or sweetened yogurt. Full-fat cheese. Nondairy creamers. Whipped toppings. Processed cheese and cheese spreads. Fats and oils Butter. Stick margarine. Lard. Shortening. Ghee. Bacon fat. Tropical oils, such as coconut, palm kernel, or palm oil. Seasonings and condiments Onion salt, garlic salt, seasoned salt, table salt, and sea salt. Worcestershire sauce. Tartar sauce. Barbecue sauce. Teriyaki sauce. Soy sauce, including reduced-sodium. Steak sauce. Canned and packaged gravies. Fish sauce. Oyster sauce. Cocktail sauce. Store-bought horseradish. Ketchup. Mustard. Meat flavorings and tenderizers. Bouillon cubes. Hot sauces. Pre-made or packaged marinades. Pre-made or packaged taco seasonings. Relishes. Regular salad dressings. Other foods Salted popcorn and pretzels. The items listed above may not be a complete list of foods and beverages you should avoid. Contact a dietitian for more information. Where to find more information National Heart, Lung, and Blood Institute: https://wilson-eaton.com/ American Heart Association: www.heart.org Academy of Nutrition and Dietetics:  www.eatright.[place]: www.kidney.org Summary The DASH eating plan is a healthy eating plan that has been shown to reduce high blood pressure (hypertension). It may also reduce your risk for type 2 diabetes, heart disease, and stroke. When on the DASH eating plan, aim to eat more fresh fruits and vegetables,  whole grains, lean proteins, low-fat dairy, and heart-healthy fats. With the DASH eating plan, you should limit salt (sodium) intake to 2,300 mg a day. If you have hypertension, you may need to reduce your sodium intake to 1,500 mg a day. Work with your health care provider or dietitian to adjust your eating plan to your individual calorie needs. This information is not intended to replace advice given to you by your health care provider. Make sure you discuss any questions you have with your health care provider. Document Revised: 07/22/2019 Document Reviewed: 07/22/2019 Elsevier Patient Education  2022 [place].   Please be sure medication list is accurate. If a new problem present, please set up appointment sooner than planned today.

## 2021-08-16 NOTE — Progress Notes (Signed)
HPI: Chase Hunter. is a 60 y.o. male, who is here today to follow on recent ED visit. He was evaluated in the ED on 08/14/2021 because elevated BP, and a 59/108.  According to patient, he called the office, because he was having palpitations, he was recommended to go to the ED. He has not had palpitations since ED visit. Medications were not changed. Home BP readings: 132/87, 148/94, 145/106, 140/107, 139/94, 152/74, 165/109, 139/101, 160/106, 155/100, 156/97, and 159/108. He stopped Carvedilol 2-3 months ago because it was causing ED, improved after discontinuing medication. Currently he is on benazepril 40 mg daily. He has been under a lot of stress. Bipolar disorder, following with psychiatrist, medication has been adjusted during the past few months.  Lab Results  Component Value Date   CREATININE 0.87 08/14/2021   BUN 15 08/14/2021   NA 136 08/14/2021   K 3.7 08/14/2021   CL 105 08/14/2021   CO2 23 08/14/2021   Negative for severe/frequent headache, visual changes, chest pain, dyspnea, focal weakness, or edema.  EKG in the ER with T abnormalities, Unspecific T wave abnormalities on inferior leads. He has had EKGs done in the past. No known CAD. Evaluated by cardiologist in the past, last visit in 09/2018 because of chest tightness.  After cardiac work-up was done, it was determined that symptoms were not cardiac in etiology. Nuclear stress test in 09/2018:  - Normal myocardial perfusion study  - No evidence for significant ischemia or scar is noted.  - Post stress: Global systolic function is normal. The ejection fraction was greater than 65%.  - No significant coronary calcifications were noted on the attenuation CT   He follows with urologist and on testosterone replacement therapy.  Review of Systems  Constitutional:  Negative for activity change, appetite change, chills and fever.  HENT:  Negative for nosebleeds and sore throat.   Respiratory:  Negative  for cough and wheezing.   Gastrointestinal:  Negative for abdominal pain, nausea and vomiting.  Genitourinary:  Negative for decreased urine volume and hematuria.  Neurological:  Negative for syncope and facial asymmetry.  Psychiatric/Behavioral:  Negative for confusion. The patient is nervous/anxious.   Rest see pertinent positives and negatives per HPI.  Current Outpatient Medications on File Prior to Visit  Medication Sig Dispense Refill   albuterol (PROVENTIL) (2.5 MG/3ML) 0.083% nebulizer solution Take 2.5 mg by nebulization every 6 (six) hours as needed for wheezing or shortness of breath.      albuterol (VENTOLIN HFA) 108 (90 Base) MCG/ACT inhaler Inhale 2 puffs into the lungs every 6 (six) hours as needed for wheezing or shortness of breath.      Cholecalciferol (VITAMIN D3) 125 MCG (5000 UT) TABS Take 5,000 Units by mouth daily.      clonazePAM (KLONOPIN) 0.5 MG tablet Take 1/2-1 tab po qd prn anxiety and panic 15 tablet 1   fluticasone (FLONASE) 50 MCG/ACT nasal spray Place into both nostrils daily as needed.     fluticasone-salmeterol (ADVAIR) 250-50 MCG/ACT AEPB Inhale 1 puff into the lungs in the morning and at bedtime.     INSULIN SYRINGE 1CC/29G (EXEL COMFORT POINT INSULIN SYR) 29G X 1/2" 1 ML MISC Inject testosterone 0.5 ml weekly 100 each 2   L-Methylfolate-Algae (DEPLIN 15 PO) Take 15 mg by mouth daily.     lisinopril (ZESTRIL) 40 MG tablet Take 1 tablet (40 mg total) by mouth daily. 90 tablet 2   Omega-3 Fatty Acids (FISH OIL) 1200 MG CAPS  Take 1,200 mg by mouth daily.      QUEtiapine (SEROQUEL XR) 300 MG 24 hr tablet Take 1 tablet (300 mg total) by mouth at bedtime. 30 tablet 1   QUEtiapine (SEROQUEL XR) 50 MG TB24 24 hr tablet Take 1 tab every evening for 2 days, then 2 tab every evening for 2 days, then 3 tablets every evening for 2 days, then 4 tablets every evening for 2 days, then 6 tabs every evening 35 tablet 0   testosterone cypionate (DEPOTESTOSTERONE CYPIONATE) 200  MG/ML injection Inject 0.5 mLs (100 mg total) into the muscle once a week. 2.5 mL 3   Vitamins-Lipotropics (LIPOFLAVONOID PO) Take by mouth.     No current facility-administered medications on file prior to visit.    Past Medical History:  Diagnosis Date   Allergy    Anemia    Asthma    Bipolar II disorder ([place])    Depression    Gallstones    GERD (gastroesophageal reflux disease)    Hx of adenomatous colonic polyps    Hyperlipidemia    Hypertension    Pancreatitis    Seizures (HCC)    past hx 6 yrs ago x 1- coming off meds caused 1 seizure    Allergies  Allergen Reactions   Cephalexin Hives   Depakote [Divalproex Sodium]     Caused Pancreatitis   Trazodone And Nefazodone     Suicidal thoughts   Lamictal [Lamotrigine] Rash    Had a seizure when he stopped it   Lipitor [Atorvastatin] Rash    Other reaction(s): Other (See Comments) Memory issues that have not resolved    Social History   Socioeconomic History   Marital status: Married    Spouse name: Not on file   Number of children: 1   Years of education: Not on file   Highest education level: Not on file  Occupational History   Occupation: retired Pharmacist, hospital  Tobacco Use   Smoking status: Never   Smokeless tobacco: Never  Substance and Sexual Activity   Alcohol use: Never   Drug use: Not Currently   Sexual activity: Not on file  Other Topics Concern   Not on file  Social History Narrative   Not on file   Social Determinants of Health   Financial Resource Strain: Low Risk    Difficulty of Paying Living Expenses: Not hard at all  Food Insecurity: No Food Insecurity   Worried About Charity fundraiser in the Last Year: Never true   [place] in the Last Year: Never true  Transportation Needs: No Transportation Needs   Lack of Transportation (Medical): No   Lack of Transportation (Non-Medical): No  Physical Activity: Insufficiently Active   Days of Exercise per Week: 3 days   Minutes of Exercise  per Session: 40 min  Stress: Stress Concern Present   Feeling of Stress : Very much  Social Connections: Moderately Integrated   Frequency of Communication with Friends and Family: Three times a week   Frequency of Social Gatherings with Friends and Family: Three times a week   Attends Religious Services: Never   Active Member of Clubs or Organizations: Yes   Attends Archivist Meetings: More than 4 times per year   Marital Status: Married   Vitals:   08/16/21 0723  BP: 138/90  Pulse: 88  Resp: 16  SpO2: 97%   Body mass index is 25.99 kg/m.  Physical Exam Nursing note reviewed.  Constitutional:  General: He is not in acute distress.    Appearance: He is well-developed.  HENT:     Head: Normocephalic and atraumatic.  Eyes:     Conjunctiva/sclera: Conjunctivae normal.  Cardiovascular:     Rate and Rhythm: Normal rate and regular rhythm.     Heart sounds: No murmur heard.    Comments: DP pulses present. Pulmonary:     Effort: Pulmonary effort is normal. No respiratory distress.     Breath sounds: Normal breath sounds.  Abdominal:     Palpations: Abdomen is soft. There is no hepatomegaly or mass.     Tenderness: There is no abdominal tenderness.  Lymphadenopathy:     Cervical: No cervical adenopathy.  Skin:    General: Skin is warm.     Findings: No erythema or rash.  Neurological:     Mental Status: He is alert and oriented to person, place, and time.     Cranial Nerves: No cranial nerve deficit.     Gait: Gait normal.  Psychiatric:     Comments: Well groomed, good eye contact.   ASSESSMENT AND PLAN:  Mr.Asiah was seen today for follow-up.  Diagnoses and all orders for this visit:  Hypertension, essential, benign BP is not well controlled. Possible complications of elevated BP discussed. Changes today:Amlodipine 5 mg 1/2 tab at bedtime added. No changes in Lisinopril dose. Low salt diet. Continue monitoring BP at home, instructed to let me  know about readings in 3-4 weeks. Instructed about warning signs. Follow-up in 3-4 months, before if needed.  Abnormal EKG I do not have a prior EKG trace for comparison. Reviewing prior visits, including cardiologist's notes, it seems like he was initially evaluated in 04/2014 for unspecific T wave abnormalities seen on EKG, asymptomatic at this time.Echo and stress test were done and otherwise negative. Clearly instructed about warning signs. We can repeat EKG next visit and then annually as far as he continues being asymptomatic.  Return in about 4 months (around 12/15/2021).  Emrey Thornley G. [place], MD  [facility]. [place] office.

## 2021-08-16 NOTE — Assessment & Plan Note (Signed)
BP is not well controlled. Possible complications of elevated BP discussed. Changes today:Amlodipine 5 mg 1/2 tab at bedtime added. No changes in Lisinopril dose. Low salt diet. Continue monitoring BP at home, instructed to let me know about readings in 3-4 weeks. Instructed about warning signs. Follow-up in 3-4 months, before if needed.

## 2021-08-16 NOTE — Assessment & Plan Note (Signed)
I do not have a prior EKG trace for comparison. Reviewing prior visits, including cardiologist's notes, it seems like he was initially evaluated in 04/2014 for unspecific T wave abnormalities seen on EKG, asymptomatic at this time.Echo and stress test were done and otherwise negative. Clearly instructed about warning signs. We can repeat EKG next visit and then annually as far as he continues being asymptomatic.

## 2021-08-19 ENCOUNTER — Encounter: Payer: Self-pay | Admitting: Family Medicine

## 2021-08-21 ENCOUNTER — Telehealth: Payer: Self-pay | Admitting: Psychiatry

## 2021-08-21 ENCOUNTER — Other Ambulatory Visit: Payer: Self-pay | Admitting: Psychiatry

## 2021-08-21 MED ORDER — QUETIAPINE FUMARATE ER 200 MG PO TB24: 200.0000 mg | ORAL_TABLET | Freq: Every day | ORAL | 0 refills | Status: DC

## 2021-08-21 NOTE — Telephone Encounter (Signed)
Next visit is 09/05/21. Dan called and he is currently taking Seroquel 300 mg but "based on the way he feels now" he wants to decrease it to 200 mg. His phone number is [phone].

## 2021-08-21 NOTE — Telephone Encounter (Signed)
[place]

## 2021-08-21 NOTE — Telephone Encounter (Signed)
[name] is right that Seroquel 300 mg is typically a more therapeutic dose, but I will send in a few Seroquel XR 200 mg tablets to allow him to increase the dose more slowly.

## 2021-08-21 NOTE — Telephone Encounter (Signed)
Sent!

## 2021-08-21 NOTE — Telephone Encounter (Signed)
Pt stated Chase Hunter has him on a ramping schedule.This weekend he is due to increase to 300 mg and has a rx waiting on him.However,he feels like he should increase to 200 mg instead.He stated he has taken med before and at the moment feels like that dose would be better.

## 2021-08-28 NOTE — Telephone Encounter (Signed)
Please call pt to follow-up from last week's call.

## 2021-08-28 NOTE — Telephone Encounter (Signed)
Pt did increase to 300 mg and is doing fine

## 2021-09-05 ENCOUNTER — Other Ambulatory Visit: Payer: Self-pay

## 2021-09-05 ENCOUNTER — Ambulatory Visit (INDEPENDENT_AMBULATORY_CARE_PROVIDER_SITE_OTHER): Payer: Medicare PPO | Admitting: Psychiatry

## 2021-09-05 ENCOUNTER — Encounter: Payer: Self-pay | Admitting: Psychiatry

## 2021-09-05 DIAGNOSIS — F410 Panic disorder [episodic paroxysmal anxiety] without agoraphobia: Secondary | ICD-10-CM

## 2021-09-05 DIAGNOSIS — F3181 Bipolar II disorder: Secondary | ICD-10-CM | POA: Diagnosis not present

## 2021-09-05 MED ORDER — SERTRALINE HCL 25 MG PO TABS: 25.0000 mg | ORAL_TABLET | Freq: Every day | ORAL | 1 refills | Status: DC

## 2021-09-05 MED ORDER — CLONAZEPAM 0.5 MG PO TABS: ORAL_TABLET | ORAL | 1 refills | Status: DC

## 2021-09-05 MED ORDER — QUETIAPINE FUMARATE ER 300 MG PO TB24: 300.0000 mg | ORAL_TABLET | Freq: Every day | ORAL | 0 refills | Status: DC

## 2021-09-05 NOTE — Progress Notes (Signed)
Chase Hunter Chase Hunter [id number] [birth year] 61 y.o.  Subjective:   Patient ID:  Chase Jim. is a 61 y.o. (DOB 1961-09-01) male.  Chief Complaint:  Chief Complaint  Patient presents with   Anxiety    HPI Chase Hunter. presents to the office today for follow-up of anxiety and mood disturbance. He reports the "300 mg is good." He reports that he is sleeping well. He reports that he is not having excessive grogginess. He reports that he continues to have daily anxiety. He describes anxiety as feeling as if his heart is beating faster and has a tightness in his chest. He occasionally using Klonopin (about 3-4 times a week) to treat these s/s. He reports that anxiety feels as if it is approaching panic. He reports that increased anxiety occurs at different times of the day and without trigger. He reports that he has some mild worry since he has been less mentally occupied since retirement.  He reports that Seroquel is "reducing my pendulum swings." No longer having racing thoughts. He reports "depression is much better." Denies irritability. Denies impulsive or risky behavior. Sleeping well. Appetite has been decreased. He reports that he has had to be intentional about eating. Improved concentration- "I can read again." Denies anhedonia. Denies SI.   Denies any affective dulling or akathisia.   He has joined a new church. He reports that they are looking for a Print production planner.   He reports that he and his wife are together most of the time. He reports that she helps him through anxious moments.   Past Psychiatric Medication Trials: Lamictal- Rash. Had seizure when it was stopped.  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 Abilify- cannot recall response. Minimal improvement. Seroquel-Affective dulling, excessive somnolence. Took up to 600 mg QHS Saphris- SI, uncontrolled  crying Latuda Trazodone- Increased BP and suicidal thoughts Propranolol- Raised BP Sertraline Lexapro Wellbutrin Duloxetine-excessively talkative. "Saying things out loud that I thought I was just thinking."  Buspar-Increased anxiety and increased BP.   AIMS    Chase Hunter Office Visit from 08/12/2021 in [place] Total Score 0      PHQ2-9    [place] Office Visit from 08/16/2021 in [place] at [facility] from 06/14/2021 in [place] at [facility] from 06/13/2020 in [place] at [facility] Total Score 5 2 0  PHQ-9 Total Score 20 12 --      Flowsheet Row ED from 08/14/2021 in [place] from 06/14/2021 in [facility] at [facility] No Risk Error: Q3, 4, or 5 should not be populated when Q2 is No        Review of Systems:  Review of Systems  Musculoskeletal:  Negative for gait problem.  Neurological:  Negative for tremors.  Psychiatric/Behavioral:         Please refer to HPI   Medications: I have reviewed the patient's current medications.  Current Outpatient Medications  Medication Sig Dispense Refill   amLODipine (NORVASC) 5 MG tablet Take 0.5 tablets (2.5 mg total) by mouth at bedtime. 30 tablet 1   Cholecalciferol (VITAMIN D3) 125 MCG (5000 UT) TABS Take 5,000 Units by mouth daily.      fluticasone (FLONASE) 50 MCG/ACT nasal spray Place into both nostrils daily as needed.     L-Methylfolate-Algae (DEPLIN 15 PO) Take 15 mg by  mouth daily.     lisinopril (ZESTRIL) 40 MG tablet Take 1 tablet (40 mg total) by mouth daily. 90 tablet 2   Omega-3 Fatty Acids (FISH OIL) 1200 MG CAPS Take 1,200 mg by mouth daily.      sertraline (ZOLOFT) 25 MG tablet Take 1 tablet (25 mg total) by mouth daily. 30 tablet 1   testosterone cypionate (DEPOTESTOSTERONE CYPIONATE) 200 MG/ML injection Inject 0.5 mLs (100  mg total) into the muscle once a week. 2.5 mL 3   Vitamins-Lipotropics (LIPOFLAVONOID PO) Take by mouth.     albuterol (PROVENTIL) (2.5 MG/3ML) 0.083% nebulizer solution Take 2.5 mg by nebulization every 6 (six) hours as needed for wheezing or shortness of breath.      albuterol (VENTOLIN HFA) 108 (90 Base) MCG/ACT inhaler Inhale 2 puffs into the lungs every 6 (six) hours as needed for wheezing or shortness of breath.      clonazePAM (KLONOPIN) 0.5 MG tablet Take 1/2-1 tab po qd prn anxiety and panic 30 tablet 1   fluticasone-salmeterol (ADVAIR) 250-50 MCG/ACT AEPB Inhale 1 puff into the lungs in the morning and at bedtime. (Patient not taking: Reported on 09/05/2021)     INSULIN SYRINGE 1CC/29G (EXEL COMFORT POINT INSULIN SYR) 29G X 1/2" 1 ML MISC Inject testosterone 0.5 ml weekly 100 each 2   QUEtiapine (SEROQUEL XR) 300 MG 24 hr tablet Take 1 tablet (300 mg total) by mouth at bedtime. 90 tablet 0   No current facility-administered medications for this visit.    Medication Side Effects: None  Allergies:  Allergies  Allergen Reactions   Cephalexin Hives   Depakote [Divalproex Sodium]     Caused Pancreatitis   Trazodone And Nefazodone     Suicidal thoughts   Lamictal [Lamotrigine] Rash    Had a seizure when he stopped it   Lipitor [Atorvastatin] Rash    Other reaction(s): Other (See Comments) Memory issues that have not resolved    Past Medical History:  Diagnosis Date   Allergy    Anemia    Asthma    Bipolar II disorder ([place])    Depression    Gallstones    GERD (gastroesophageal reflux disease)    Hx of adenomatous colonic polyps    Hyperlipidemia    Hypertension    Pancreatitis    Seizures (HCC)    past hx 6 yrs ago x 1- coming off meds caused 1 seizure     Past Medical History, Surgical history, Social history, and Family history were reviewed and updated as appropriate.   Please see review of systems for further details on the patient's review from today.    Objective:   Physical Exam:  BP (!) 137/91    Pulse 94    Wt 169 lb (76.7 kg)    BMI 24.96 kg/m   Physical Exam Constitutional:      General: He is not in acute distress. Musculoskeletal:        General: No deformity.  Neurological:     Mental Status: He is alert and oriented to person, place, and time.     Coordination: Coordination normal.  Psychiatric:        Attention and Perception: Attention and perception normal. He does not perceive auditory or visual hallucinations.        Mood and Affect: Mood is anxious. Mood is not depressed. Affect is not labile, blunt, angry or inappropriate.        Speech: Speech normal.  Behavior: Behavior normal.        Thought Content: Thought content normal. Thought content is not paranoid or delusional. Thought content does not include homicidal or suicidal ideation. Thought content does not include homicidal or suicidal plan.        Cognition and Memory: Cognition and memory normal.        Judgment: Judgment normal.     Comments: Insight intact    Lab Review:     Component Value Date/Time   NA 136 08/14/2021 1415   K 3.7 08/14/2021 1415   CL 105 08/14/2021 1415   CO2 23 08/14/2021 1415   GLUCOSE 90 08/14/2021 1415   BUN 15 08/14/2021 1415   CREATININE 0.87 08/14/2021 1415   CREATININE 0.96 05/14/2020 0806   CALCIUM 9.1 08/14/2021 1415   PROT 7.2 08/14/2021 1415   ALBUMIN 4.1 08/14/2021 1415   AST 18 08/14/2021 1415   ALT 20 08/14/2021 1415   ALKPHOS 55 08/14/2021 1415   BILITOT 0.7 08/14/2021 1415   GFRNONAA >60 08/14/2021 1415   GFRNONAA 87 05/14/2020 0806   GFRAA 101 05/14/2020 0806       Component Value Date/Time   WBC 9.8 08/14/2021 1415   RBC 5.18 08/14/2021 1415   HGB 15.9 08/14/2021 1415   HCT 47.4 08/14/2021 1415   PLT 203 08/14/2021 1415   MCV 91.5 08/14/2021 1415   MCH 30.7 08/14/2021 1415   MCHC 33.5 08/14/2021 1415   RDW 12.0 08/14/2021 1415   LYMPHSABS 1.9 08/14/2021 1415   MONOABS 0.5 08/14/2021  1415   EOSABS 0.1 08/14/2021 1415   BASOSABS 0.1 08/14/2021 1415    No results found for: POCLITH, LITHIUM   Lab Results  Component Value Date   VALPROATE 33.1 (L) 07/15/2019     .res Assessment: Plan:   Pt seen for 30 minutes and time spent discussing treatment options for anxiety, including reviewing results from pharmacological testing. Discussed that he has has adverse reactions to several medications that have been prescribed for anxiety, including some paradoxical reactions, such as Propranolol raising BP. Discussed option of increasing Seroquel, using very low dose Seroquel when acute anxiety occurs, or adding very low dose Sertraline to target anxiety. Discussed that he tolerated Sertraline in the past and since he has a first degree relative that has responded well to Sertraline, this may be an effective and well tolerated treatment option. Recommend low dose to minimize risk of Sertraline precipitating manic s/s.  He agrees to trial of Sertraline 25 mg po qd for anxiety.  Continue Seroquel XR 300 mg po q evening for mood s/s.  Continue Klonopin prn for anxiety. Long-term goal is to decrease need for regular use of Klonopin after Sertraline improves anxiety.  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 Pt to follow-up in 4 weeks or sooner if clinically indicated.  Patient advised to contact office with any questions, adverse effects, or acute worsening in signs and symptoms.    Chase was seen today for anxiety.  Diagnoses and all orders for this visit:  Panic -     sertraline (ZOLOFT) 25 MG tablet; Take 1 tablet (25 mg total) by mouth daily. -     clonazePAM (KLONOPIN) 0.5 MG tablet; Take 1/2-1 tab po qd prn anxiety and panic  Bipolar II disorder (HCC) -     sertraline (ZOLOFT) 25 MG tablet; Take 1 tablet (25 mg total) by mouth daily. -     QUEtiapine (SEROQUEL XR) 300 MG 24 hr tablet; Take 1  tablet (300 mg total) by mouth at bedtime.     Please see  After Visit Summary for patient specific instructions.  Future Appointments  Date Time Provider [place]  10/09/2021  8:30 AM Thayer Headings, PMHNP [facility] None    No orders of the defined types were placed in this encounter.   -------------------------------

## 2021-10-09 ENCOUNTER — Ambulatory Visit (INDEPENDENT_AMBULATORY_CARE_PROVIDER_SITE_OTHER): Payer: Medicare Other | Admitting: Psychiatry

## 2021-10-09 ENCOUNTER — Other Ambulatory Visit: Payer: Self-pay

## 2021-10-09 ENCOUNTER — Encounter: Payer: Self-pay | Admitting: Psychiatry

## 2021-10-09 VITALS — Wt 175.0 lb

## 2021-10-09 DIAGNOSIS — F3181 Bipolar II disorder: Secondary | ICD-10-CM | POA: Diagnosis not present

## 2021-10-09 DIAGNOSIS — Z1589 Genetic susceptibility to other disease: Secondary | ICD-10-CM

## 2021-10-09 DIAGNOSIS — F410 Panic disorder [episodic paroxysmal anxiety] without agoraphobia: Secondary | ICD-10-CM | POA: Diagnosis not present

## 2021-10-09 MED ORDER — QUETIAPINE FUMARATE ER 300 MG PO TB24: 300.0000 mg | ORAL_TABLET | Freq: Every day | ORAL | 0 refills | Status: DC

## 2021-10-09 MED ORDER — SERTRALINE HCL 25 MG PO TABS: 25.0000 mg | ORAL_TABLET | Freq: Every day | ORAL | 1 refills | Status: DC

## 2021-10-09 MED ORDER — QUETIAPINE FUMARATE ER 300 MG PO TB24: 300.0000 mg | ORAL_TABLET | Freq: Every evening | ORAL | 0 refills | Status: DC

## 2021-10-09 MED ORDER — DEPLIN 15 15-90.314 MG PO CAPS: 15.0000 mg | ORAL_CAPSULE | Freq: Every day | ORAL | 3 refills | Status: DC

## 2021-10-09 NOTE — Progress Notes (Signed)
Chase Hunter [id number] 04-Dec-1960 61 y.o.  Subjective:   Patient ID:  Chase Hunter. is a 61 y.o. (DOB Dec 31, 1960) male.  Chief Complaint:  Chief Complaint  Patient presents with   Follow-up    Anxiety and mood disturbance    HPI [name]. presents to the office today for follow-up of anxiety and mood disturbance.   He reports that Sertraline has been helpful and well tolerated. He reports that he has not had intrusive thoughts recently.He reports that his worry has been a lot less. He reports that he has been using Klonopin prn about 3 times a week. He will use Klonopin with some panic like symptoms (heart beating fast). Reports that chest tightness has improved. Denies having any full blown panic attacks.   Denies current depressed mood. Denies elevated or irritable mood. Denies impulsive or risky behavior. Sleeping 9 hours a night and reports that he no longer has excessive grogginess. Energy has been good. Motivation has also been good. No longer having racing thoughts. Able to concentrate. Reading again. Enjoying things. Appetite has improved. He reports that his weight has increased some. Denies SI.   Has become more engaged in new church and playing some music. 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   Expecting first grandchild in March.   He reports that he has been seeing a therapist for 6 years through [facility]. He plans to continue to see therapist once she transitions to a new practice/group.  Past Psychiatric Medication Trials: Lamictal- Rash. Had seizure when it was stopped.  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 Abilify- cannot recall response. Minimal improvement. Seroquel-Affective dulling, excessive somnolence. Took up to 600 mg QHS Saphris- SI, uncontrolled crying Latuda Trazodone- Increased BP and suicidal  thoughts Propranolol- Raised BP Sertraline Lexapro Wellbutrin Duloxetine-excessively talkative. "Saying things out loud that I thought I was just thinking."  Buspar-Increased anxiety and increased BP.    AIMS    [place] Office Visit from 10/09/2021 in [place] Visit from 08/12/2021 in [place] Total Score 0 0      PHQ2-9    [place] Office Visit from 08/16/2021 in [place] at [place] from 06/14/2021 in [place] at [place] from 06/13/2020 in [place] at [facility] Total Score 5 2 0  PHQ-9 Total Score 20 12 --      Flowsheet Row ED from 08/14/2021 in [place] from 06/14/2021 in [facility] at [place] No Risk Error: Q3, 4, or 5 should not be populated when Q2 is No        Review of Systems:  Review of Systems  Gastrointestinal: Negative.   Musculoskeletal:  Negative for gait problem.  Neurological:  Negative for tremors and headaches.  Psychiatric/Behavioral:         Please refer to HPI   Medications: I have reviewed the patient's current medications.  Current Outpatient Medications  Medication Sig Dispense Refill   albuterol (PROVENTIL) (2.5 MG/3ML) 0.083% nebulizer solution Take 2.5 mg by nebulization every 6 (six) hours as needed for wheezing or shortness of breath.      albuterol (VENTOLIN HFA) 108 (90 Base) MCG/ACT inhaler Inhale 2 puffs into the lungs every 6 (six) hours as needed for wheezing or shortness of breath.  Cholecalciferol (VITAMIN D3) 125 MCG (5000 UT) TABS Take 5,000 Units by mouth daily.      clonazePAM (KLONOPIN) 0.5 MG tablet Take 1/2-1 tab po qd prn anxiety and panic 30 tablet 1   fluticasone (FLONASE) 50 MCG/ACT nasal spray Place into both nostrils daily as needed.     INSULIN SYRINGE 1CC/29G (EXEL COMFORT POINT INSULIN SYR)  29G X 1/2" 1 ML MISC Inject testosterone 0.5 ml weekly 100 each 2   L-Methylfolate-Algae (DEPLIN 15) 15-90.314 MG CAPS Take 15 mg by mouth daily. 90 capsule 3   lisinopril (ZESTRIL) 40 MG tablet Take 1 tablet (40 mg total) by mouth daily. 90 tablet 2   Omega-3 Fatty Acids (FISH OIL) 1200 MG CAPS Take 1,200 mg by mouth daily.      testosterone cypionate (DEPOTESTOSTERONE CYPIONATE) 200 MG/ML injection Inject 0.5 mLs (100 mg total) into the muscle once a week. 2.5 mL 3   Vitamins-Lipotropics (LIPOFLAVONOID PO) Take by mouth.     amLODipine (NORVASC) 5 MG tablet Take 0.5 tablets (2.5 mg total) by mouth at bedtime. 30 tablet 1   fluticasone-salmeterol (ADVAIR) 250-50 MCG/ACT AEPB Inhale 1 puff into the lungs in the morning and at bedtime. (Patient not taking: Reported on 09/05/2021)     QUEtiapine (SEROQUEL XR) 300 MG 24 hr tablet Take 1 tablet (300 mg total) by mouth every evening. 90 tablet 0   sertraline (ZOLOFT) 25 MG tablet Take 1 tablet (25 mg total) by mouth daily. 90 tablet 1   No current facility-administered medications for this visit.    Medication Side Effects: None  Allergies:  Allergies  Allergen Reactions   Cephalexin Hives   Depakote [Divalproex Sodium]     Caused Pancreatitis   Trazodone And Nefazodone     Suicidal thoughts   Lamictal [Lamotrigine] Rash    Had a seizure when he stopped it   Lipitor [Atorvastatin] Rash    Other reaction(s): Other (See Comments) Memory issues that have not resolved    Past Medical History:  Diagnosis Date   Allergy    Anemia    Asthma    Bipolar II disorder ([place])    Depression    Gallstones    GERD (gastroesophageal reflux disease)    Hx of adenomatous colonic polyps    Hyperlipidemia    Hypertension    Pancreatitis    Seizures ([facility])    past hx 6 yrs ago x 1- coming off meds caused 1 seizure     Past Medical History, Surgical history, Social history, and Family history were reviewed and updated as appropriate.   Please see  review of systems for further details on the patient's review from today.   Objective:   Physical Exam:  Wt 175 lb (79.4 kg)    BMI 25.84 kg/m   Physical Exam Constitutional:      General: He is not in acute distress. Musculoskeletal:        General: No deformity.  Neurological:     Mental Status: He is alert and oriented to person, place, and time.     Coordination: Coordination normal.  Psychiatric:        Attention and Perception: Attention and perception normal. He does not perceive auditory or visual hallucinations.        Mood and Affect: Mood normal. Mood is not anxious or depressed. Affect is not labile, blunt, angry or inappropriate.        Speech: Speech normal.        Behavior: Behavior  normal.        Thought Content: Thought content normal. Thought content is not paranoid or delusional. Thought content does not include homicidal or suicidal ideation. Thought content does not include homicidal or suicidal plan.        Cognition and Memory: Cognition and memory normal.        Judgment: Judgment normal.     Comments: Insight intact    Lab Review:     Component Value Date/Time   NA 136 08/14/2021 1415   K 3.7 08/14/2021 1415   CL 105 08/14/2021 1415   CO2 23 08/14/2021 1415   GLUCOSE 90 08/14/2021 1415   BUN 15 08/14/2021 1415   CREATININE 0.87 08/14/2021 1415   CREATININE 0.96 05/14/2020 0806   CALCIUM 9.1 08/14/2021 1415   PROT 7.2 08/14/2021 1415   ALBUMIN 4.1 08/14/2021 1415   AST 18 08/14/2021 1415   ALT 20 08/14/2021 1415   ALKPHOS 55 08/14/2021 1415   BILITOT 0.7 08/14/2021 1415   GFRNONAA >60 08/14/2021 1415   GFRNONAA 87 05/14/2020 0806   GFRAA 101 05/14/2020 0806       Component Value Date/Time   WBC 9.8 08/14/2021 1415   RBC 5.18 08/14/2021 1415   HGB 15.9 08/14/2021 1415   HCT 47.4 08/14/2021 1415   PLT 203 08/14/2021 1415   MCV 91.5 08/14/2021 1415   MCH 30.7 08/14/2021 1415   MCHC 33.5 08/14/2021 1415   RDW 12.0 08/14/2021 1415    LYMPHSABS 1.9 08/14/2021 1415   MONOABS 0.5 08/14/2021 1415   EOSABS 0.1 08/14/2021 1415   BASOSABS 0.1 08/14/2021 1415    No results found for: POCLITH, LITHIUM   Lab Results  Component Value Date   VALPROATE 33.1 (L) 07/15/2019     .res Assessment: Plan:   Pt seen for 25 minutes and time spent discussing MTHFR mutation, referring to pharmacogenetic testing results, adding MTHFR diagnosis and discussing differences between brand and generic L-methylfolate. Pt reports that he prefers to continue to receive Deplin from [facility]. Will send new script to [facility]. Will continue Sertraline 25 mg po qd for anxiety and mood since pt reports that anxiety has improved with Sertraline and is currently manageable.  Will continue Seroquel XR 300 mg po q evening for Bipolar D/O Pt to follow-up in 6 weeks or sooner if clinically indicated.  Patient advised to contact office with any questions, adverse effects, or acute worsening in signs and symptoms.  Denson was seen today for follow-up.  Diagnoses and all orders for this visit:  Bipolar II disorder ([place]) -     sertraline (ZOLOFT) 25 MG tablet; Take 1 tablet (25 mg total) by mouth daily. -     Discontinue: QUEtiapine (SEROQUEL XR) 300 MG 24 hr tablet; Take 1 tablet (300 mg total) by mouth at bedtime. -     QUEtiapine (SEROQUEL XR) 300 MG 24 hr tablet; Take 1 tablet (300 mg total) by mouth every evening.  Heterozygous MTHFR mutation [id number] -     L-Methylfolate-Algae (DEPLIN 15) 15-90.314 MG CAPS; Take 15 mg by mouth daily.  Panic -     sertraline (ZOLOFT) 25 MG tablet; Take 1 tablet (25 mg total) by mouth daily.     Please see After Visit Summary for patient specific instructions.  Future Appointments  Date Time Provider [place]  11/20/2021  8:30 AM Thayer Headings, PMHNP [facility] None     No orders of the defined types were placed in this encounter.   -------------------------------

## 2021-10-17 ENCOUNTER — Other Ambulatory Visit: Payer: Self-pay | Admitting: Internal Medicine

## 2021-10-17 ENCOUNTER — Other Ambulatory Visit: Payer: Self-pay | Admitting: Family Medicine

## 2021-10-17 DIAGNOSIS — E230 Hypopituitarism: Secondary | ICD-10-CM

## 2021-10-17 DIAGNOSIS — E221 Hyperprolactinemia: Secondary | ICD-10-CM

## 2021-10-18 MED ORDER — ALBUTEROL SULFATE HFA 108 (90 BASE) MCG/ACT IN AERS: 2.0000 | INHALATION_SPRAY | Freq: Four times a day (QID) | RESPIRATORY_TRACT | 1 refills | Status: DC | PRN

## 2021-10-30 ENCOUNTER — Ambulatory Visit
Admission: RE | Admit: 2021-10-30 | Discharge: 2021-10-30 | Disposition: A | Discharge status: Home / Self Care | Payer: Medicare PPO | Source: Ambulatory Visit | Attending: Internal Medicine | Admitting: Internal Medicine

## 2021-10-30 ENCOUNTER — Other Ambulatory Visit: Payer: Self-pay

## 2021-10-30 DIAGNOSIS — E221 Hyperprolactinemia: Secondary | ICD-10-CM

## 2021-10-30 DIAGNOSIS — E230 Hypopituitarism: Secondary | ICD-10-CM

## 2021-10-30 IMAGING — MR MR HEAD WO/W CM
13 of 20 series · 34 of 48 positions shown · IV contrast (multihance)
Comparison: None.

CLINICAL DATA: Hypogonadotropic hypogonadism. Possible pituitary
lesion on CT [GZ]

EXAM:
MRI HEAD WITHOUT AND WITH CONTRAST
TECHNIQUE: Multiplanar, multiecho pulse sequences of the brain and surrounding
structures were obtained without and with intravenous contrast.
CONTRAST:  10mL MULTIHANCE GADOBENATE DIMEGLUMINE 529 MG/ML IV SOLN

[Series 2: T1 · sagittal · 5.0mm · 0.45mm/px · 1 of 21 slices shown (1 of 3)]
[im 1/21]
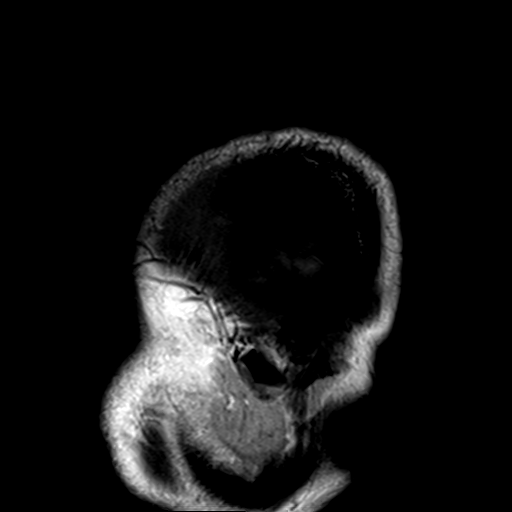

[Series 3: DWI · axial · 3.0mm · 1.80mm/px · z∈[-51,+95]mm · 9 of 100 slices shown]
[im 1/100]
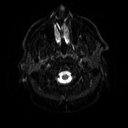
[im 13/100]
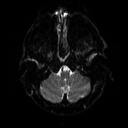
[im 25/100]
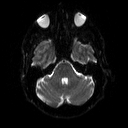
[im 38/100]
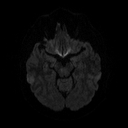
[im 50/100]
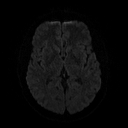
[im 62/100]
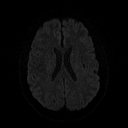
[im 75/100]
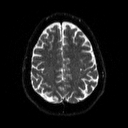
[im 87/100]
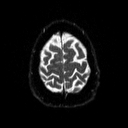
[im 100/100]
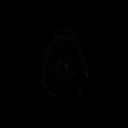

[Series 4: dwi_adc · axial · 3.0mm · 1.80mm/px · z∈[-51,+95]mm · 5 of 50 slices shown]
[im 1/50]
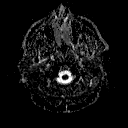
[im 13/50]
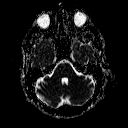
[im 25/50]
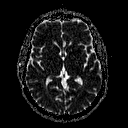
[im 37/50]
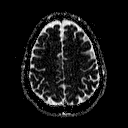
[im 50/50]
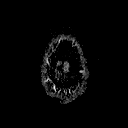

[Series 5: T2 · axial · 5.0mm · 0.36mm/px · z∈[-60,+107]mm · 3 of 27 slices shown (1 of 2)]
[im 1/27]
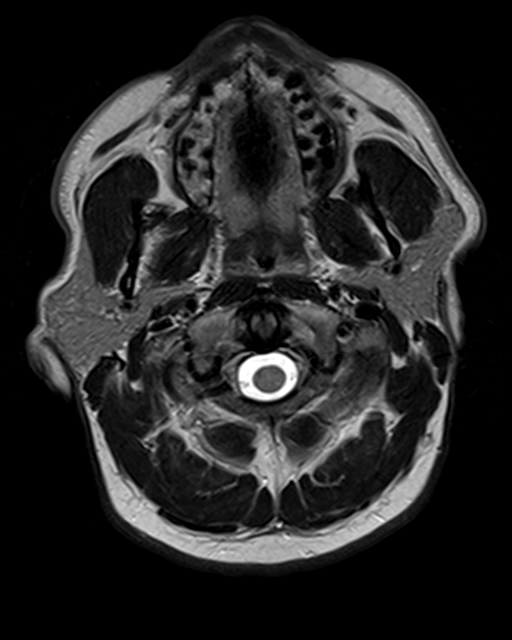
[im 14/27]
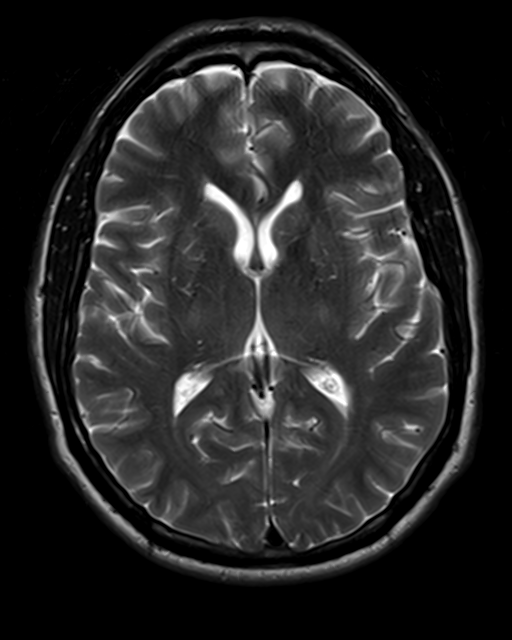
[im 27/27]
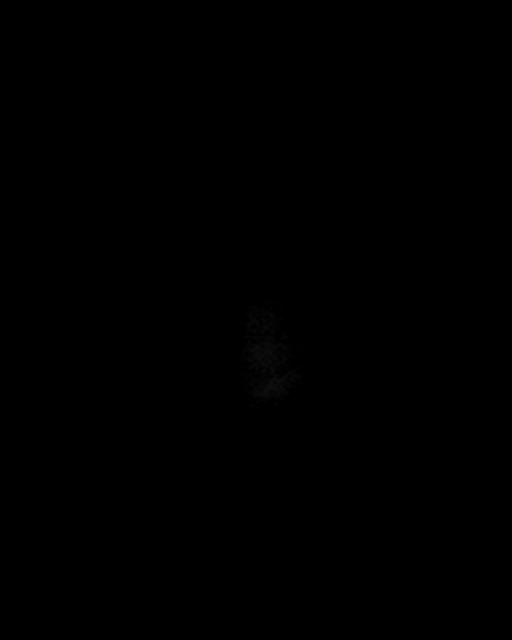

[Series 6: FLAIR · axial · 3.0mm · 0.45mm/px · z∈[-53,+100]mm · 3 of 34 slices shown]
[im 1/34]
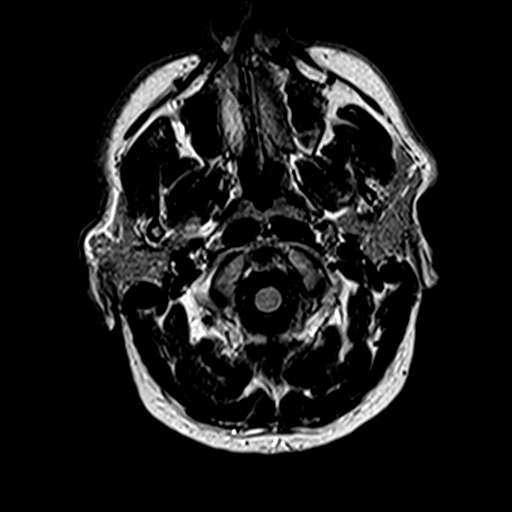
[im 17/34]
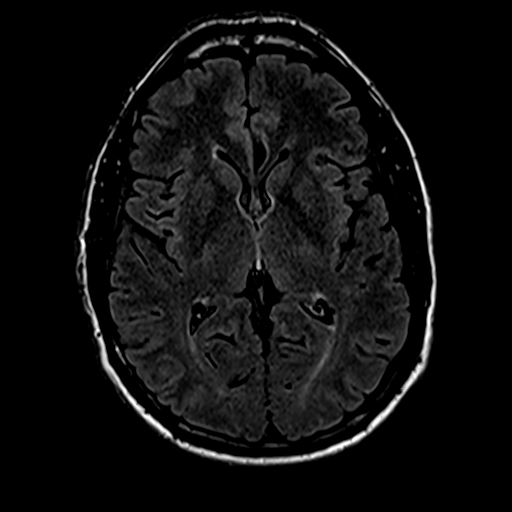
[im 34/34]
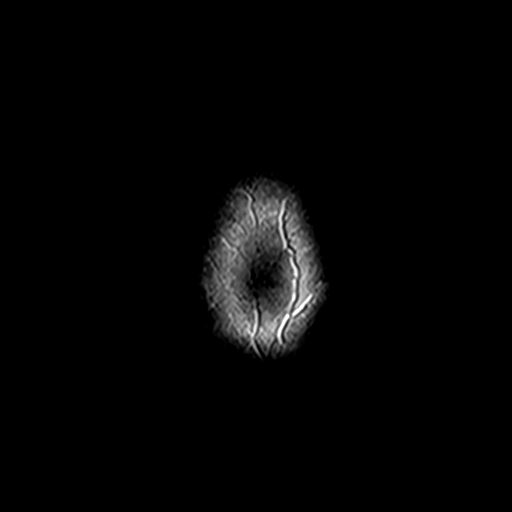

[Series 8: swi_images · axial · 4.0mm · 0.94mm/px · z∈[-53,+101]mm · 4 of 40 slices shown]
[im 1/40]
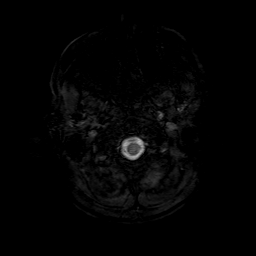
[im 14/40]
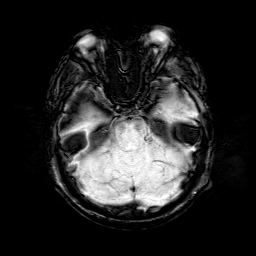
[im 27/40]
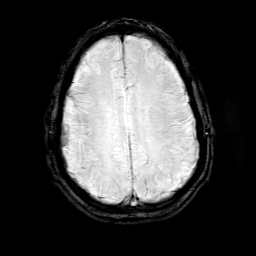
[im 40/40]
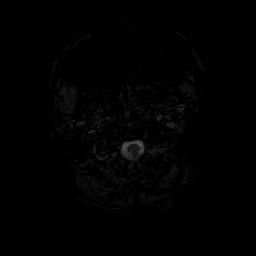

[Series 9: T1 · sagittal · 3.0mm · 0.33mm/px · 1 of 11 slices shown (2 of 3)]
[im 1/11]
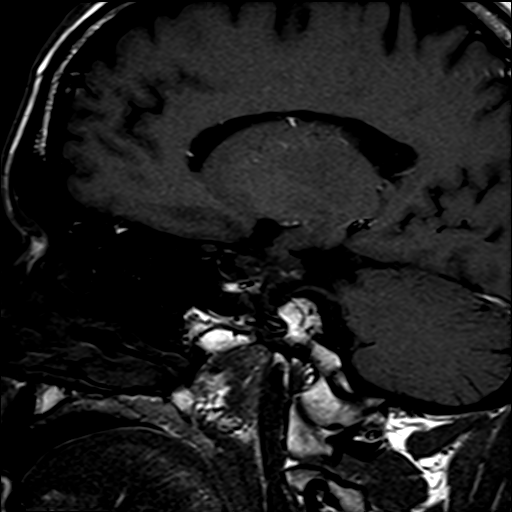

[Series 10: T1 · coronal · 3.0mm · 0.33mm/px · 1 of 11 slices shown (3 of 3)]
[im 1/11]
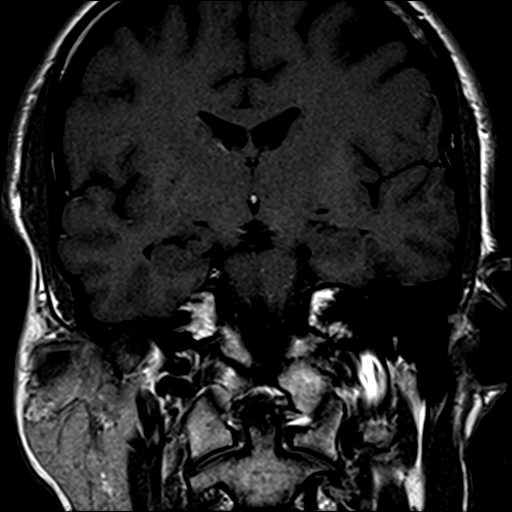

[Series 11: T2 · coronal · 3.0mm · 0.23mm/px · 1 of 11 slices shown (2 of 2)]
[im 1/11]
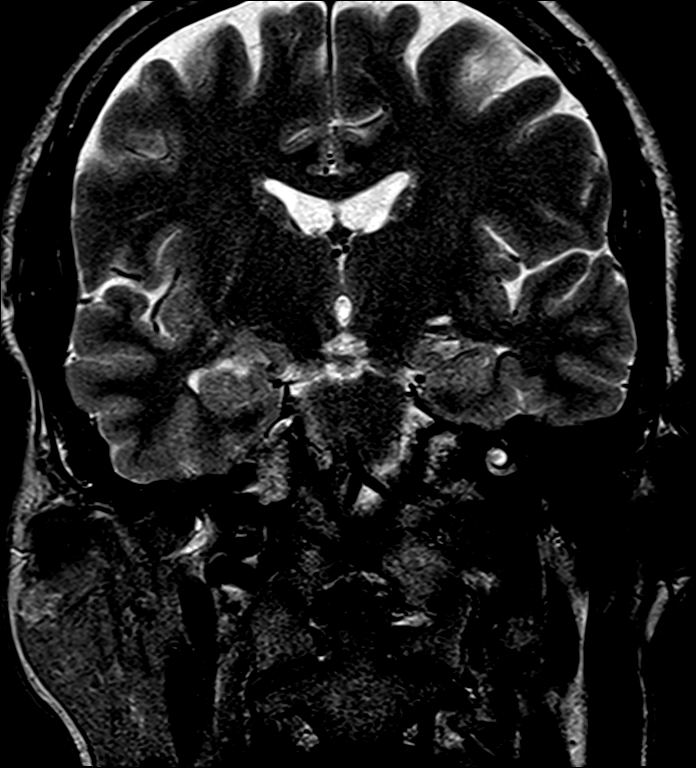

[Series 12: pre cor dynamic · coronal · non-contrast · 3.0mm · 0.35mm/px · 1 of 9 slices shown]
[im 1/9]
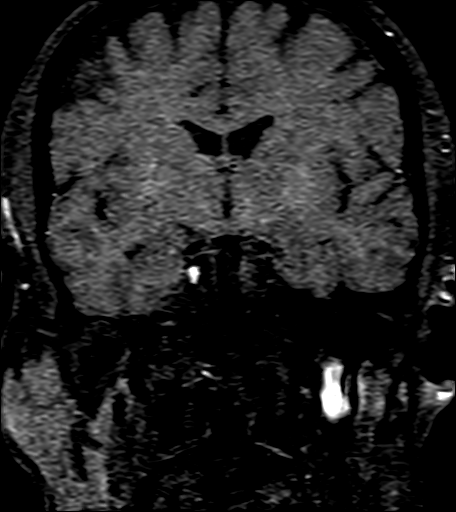

[Series 19: T1 post-contrast · sagittal · 3.0mm · 0.33mm/px · 1 of 11 slices shown (1 of 3)]
[im 1/11]
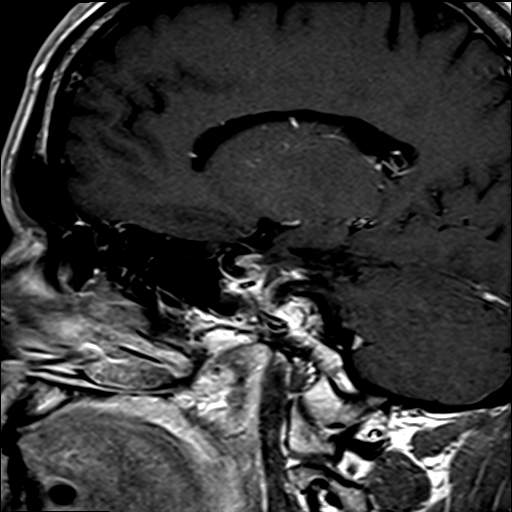

[Series 20: T1 post-contrast · coronal · 3.0mm · 0.33mm/px · 1 of 11 slices shown (2 of 3)]
[im 1/11]
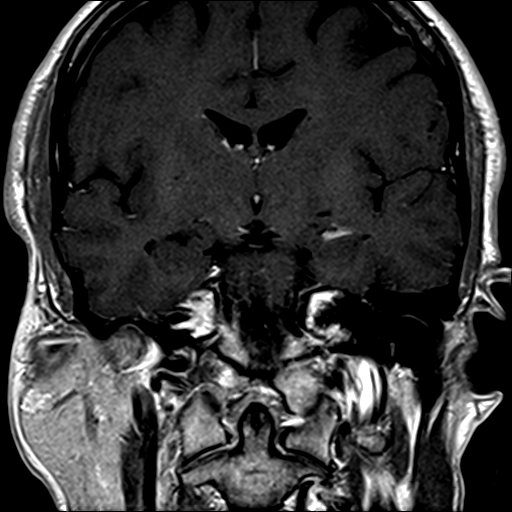

[Series 22: T1 post-contrast · coronal · 5.0mm · 0.45mm/px · 3 of 29 slices shown (3 of 3)]
[im 1/29]
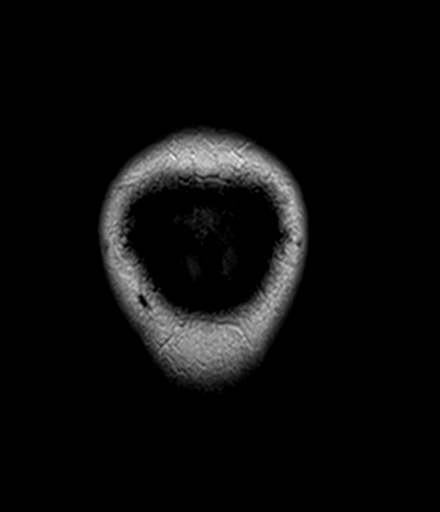
[im 15/29]
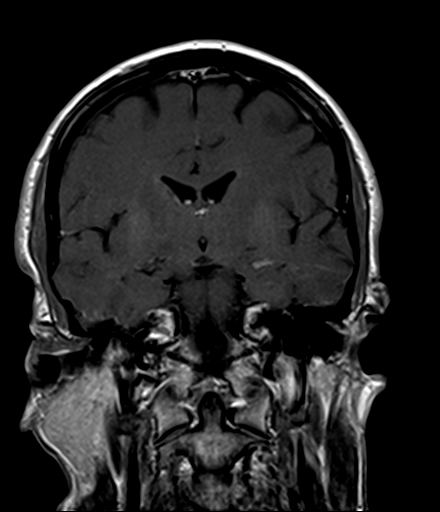
[im 29/29]
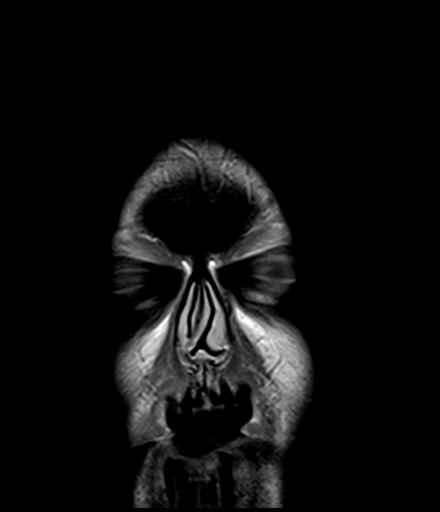

[34 of 48 positions shown; findings below may reference images not displayed]

FINDINGS: Brain: Dynamic pituitary protocol. Pituitary normal in size
measuring approximately 4 mm in height. Pituitary enhances
homogeneously. No adenoma identified. Infundibulum midline.
Cavernous sinus normal bilaterally. No compression of the optic
chiasm.

Ventricle size and cerebral volume normal. Negative for acute or
chronic infarct. Normal white matter. Negative for hemorrhage or
mass. Normal enhancement of the brain.

Vascular: Normal arterial flow voids.

Skull and upper cervical spine: Negative

Sinuses/Orbits: Paranasal sinuses clear.  Negative orbit

Other: None
IMPRESSION: Normal MRI of the brain and pituitary. No pituitary lesion
identified.

## 2021-10-30 MED ORDER — GADOBENATE DIMEGLUMINE 529 MG/ML IV SOLN
10.0000 mL | Freq: Once | INTRAVENOUS | Status: AC | PRN
  Administered 2021-10-30: 10 mL via INTRAVENOUS

## 2021-11-09 ENCOUNTER — Other Ambulatory Visit: Payer: Self-pay | Admitting: Family Medicine

## 2021-11-11 ENCOUNTER — Other Ambulatory Visit: Payer: Self-pay | Admitting: Psychiatry

## 2021-11-11 DIAGNOSIS — F410 Panic disorder [episodic paroxysmal anxiety] without agoraphobia: Secondary | ICD-10-CM

## 2021-11-20 ENCOUNTER — Other Ambulatory Visit: Payer: Self-pay

## 2021-11-20 ENCOUNTER — Encounter: Payer: Self-pay | Admitting: Psychiatry

## 2021-11-20 ENCOUNTER — Ambulatory Visit (INDEPENDENT_AMBULATORY_CARE_PROVIDER_SITE_OTHER): Payer: Medicare PPO | Admitting: Psychiatry

## 2021-11-20 VITALS — Wt 186.0 lb

## 2021-11-20 DIAGNOSIS — F410 Panic disorder [episodic paroxysmal anxiety] without agoraphobia: Secondary | ICD-10-CM

## 2021-11-20 DIAGNOSIS — F3181 Bipolar II disorder: Secondary | ICD-10-CM | POA: Diagnosis not present

## 2021-11-20 MED ORDER — QUETIAPINE FUMARATE ER 300 MG PO TB24: 300.0000 mg | ORAL_TABLET | Freq: Every evening | ORAL | 0 refills | Status: DC

## 2021-11-20 MED ORDER — SERTRALINE HCL 50 MG PO TABS: 50.0000 mg | ORAL_TABLET | Freq: Every day | ORAL | 1 refills | Status: DC

## 2021-11-20 NOTE — Progress Notes (Signed)
Chase Bode [name] ?[id number] ?08-30-1961 ?61 y.o. ? ?Subjective:  ? ?Patient ID:  Chase Hunter. is a 61 y.o. (DOB [birth year]) male. ? ?Chief Complaint:  ?Chief Complaint  ?Patient presents with  ? Depression  ? ? ?HPI ?Chase Bode [name] presents to the office today for follow-up of anxiety and mood disturbance. He reports that he has been "on the depressed side" for several weeks. He reports that depression varies from moderate to severe. He questions if new grandchild and sister-in-law is visiting for a month. He reports that he gets along well with her, however it is a change to the routine. He reports that anxiety has been well controlled. Sleeping well with Seroquel and averaging 9 hours a night. He reports that his energy and motivation have been lower. Continues to do what is needed. Less enjoyment in things. Appetite has significantly decreased. He has been eating some candy. Concentration has been ok and has continued to be able to read. He reports that he has had some passive death wishes and thoughts that people would be better off without him. He reports occasional suicidal thoughts. Denies suicidal intent- "I can't do that... not wanting to inflict that on my family."  ? ?His first grandchild was born recently.  ? ?He reports that he will continue to see his therapist until April and then will no longer be able to see her due to her changing platforms. Has been seeing her every 2 weeks.  ? 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  ? ?He reports that he is using Klonopin prn 1-2 times a week. ? ?Past Psychiatric Medication Trials: ?Lamictal- Rash. Had seizure when it was stopped.  ?Depakote- Pancreatitis ?Lithium- Family commented that he "seemed like a zombie" ?Vraylar- Started about 6 weeks ago. ?Abilify- cannot recall response. Minimal improvement. ?Seroquel-Affective dulling, excessive somnolence. Took up to 600 mg QHS ?Saphris- SI, uncontrolled crying ?Latuda ?Trazodone- Increased BP  and suicidal thoughts ?Propranolol- Raised BP ?Sertraline ?Lexapro ?Wellbutrin ?Duloxetine-excessively talkative. "Saying things out loud that I thought I was just thinking."  ?Buspar-Increased anxiety and increased BP.  ? ?AIMS   ? ?[place] Office Visit from 10/09/2021 in [place] Office Visit from 08/12/2021 in [place]  ?AIMS Total Score 0 0  ? ?  ? ?PHQ2-9   ? ?[place] Office Visit from 08/16/2021 in [facility] at [place] from 06/14/2021 in [facility] at [facility] from 06/13/2020 in [facility] [place] at [place]  ?PHQ-2 Total Score 5 2 0  ?PHQ-9 Total Score 20 12 --  ? ?  ? ?[place] ED from 08/14/2021 in [place] from 06/14/2021 in [place] at [place]  ?C-SSRS RISK CATEGORY No Risk Error: Q3, 4, or 5 should not be populated when Q2 is No  ? ?  ?  ? ?Review of Systems:  ?Review of Systems  ?Musculoskeletal:  Negative for gait problem.  ?Neurological:  Negative for tremors.  ?Psychiatric/Behavioral:    ?     Please refer to HPI  ? ?Medications: I have reviewed the patient's current medications. ? ?Current Outpatient Medications  ?Medication Sig Dispense Refill  ? amLODipine (NORVASC) 5 MG tablet Take 0.5 tablets (2.5 mg total) by mouth at bedtime. 30 tablet 1  ? Cholecalciferol (VITAMIN D3) 125 MCG (5000 UT) TABS Take 5,000 Units by mouth daily.     ? clonazePAM (KLONOPIN) 0.5 MG tablet Take 1/2-1 tablet BY MOUTH EVERY DAY AS  NEEDED FOR anxiety AND panic. 30 tablet 1  ? fluticasone (FLONASE) 50 MCG/ACT nasal spray Place into both nostrils daily as needed.    ? L-Methylfolate-Algae (DEPLIN 15) 15-90.314 MG CAPS Take 15 mg by mouth daily. 90 capsule 3  ? lisinopril (ZESTRIL) 40 MG tablet Take 1 tablet (40 mg total) by mouth daily. 90 tablet 2  ? Omega-3 Fatty Acids (FISH OIL) 1200 MG CAPS Take 1,200 mg by mouth daily.     ? testosterone  cypionate (DEPOTESTOSTERONE CYPIONATE) 200 MG/ML injection Inject 0.5 mLs (100 mg total) into the muscle once a week. 2.5 mL 3  ? Vitamins-Lipotropics (LIPOFLAVONOID PO) Take by mouth.    ? albuterol (PROVENTIL) (2.5 MG/3ML) 0.083% nebulizer solution Take 2.5 mg by nebulization every 6 (six) hours as needed for wheezing or shortness of breath.     ? albuterol (VENTOLIN HFA) 108 (90 Base) MCG/ACT inhaler Inhale 2 puffs into the lungs every 6 (six) hours as needed for wheezing or shortness of breath. 18 g 1  ? fluticasone-salmeterol (ADVAIR) 250-50 MCG/ACT AEPB Inhale 1 puff into the lungs in the morning and at bedtime. (Patient not taking: Reported on 09/05/2021)    ? INSULIN SYRINGE 1CC/29G (EXEL COMFORT POINT INSULIN SYR) 29G X 1/2" 1 ML MISC Inject testosterone 0.5 ml weekly 100 each 2  ? QUEtiapine (SEROQUEL XR) 300 MG 24 hr tablet Take 1 tablet (300 mg total) by mouth every evening. 90 tablet 0  ? sertraline (ZOLOFT) 50 MG tablet Take 1 tablet (50 mg total) by mouth daily. 30 tablet 1  ? ?No current facility-administered medications for this visit.  ? ? ?Medication Side Effects: None ? ?Allergies:  ?Allergies  ?Allergen Reactions  ? Cephalexin Hives  ? Depakote [Divalproex Sodium]   ?  Caused Pancreatitis  ? Trazodone And Nefazodone   ?  Suicidal thoughts  ? Lamictal [Lamotrigine] Rash  ?  Had a seizure when he stopped it  ? Lipitor [Atorvastatin] Rash  ?  Other reaction(s): Other (See Comments) ?Memory issues that have not resolved  ? ? ?Past Medical History:  ?Diagnosis Date  ? Allergy   ? Anemia   ? Asthma   ? Bipolar II disorder ([place])   ? Depression   ? Gallstones   ? GERD (gastroesophageal reflux disease)   ? Hx of adenomatous colonic polyps   ? Hyperlipidemia   ? Hypertension   ? Pancreatitis   ? Seizures ([place])   ? past hx 6 yrs ago x 1- coming off meds caused 1 seizure   ? ? ?Past Medical History, Surgical history, Social history, and Family history were reviewed and updated as appropriate.  ? ?Please see  review of systems for further details on the patient's review from today.  ? ?Objective:  ? ?Physical Exam:  ?Wt 186 lb (84.4 kg)   BMI 27.47 kg/m?  ? ?Physical Exam ?Constitutional:   ?   General: He is not in acute distress. ?Musculoskeletal:     ?   General: No deformity.  ?Neurological:  ?   Mental Status: He is alert and oriented to person, place, and time.  ?   Coordination: Coordination normal.  ?Psychiatric:     ?   Attention and Perception: Attention and perception normal. He does not perceive auditory or visual hallucinations.     ?   Mood and Affect: Mood is depressed. Mood is not anxious. Affect is not labile, blunt, angry or inappropriate.     ?   Speech: Speech  normal.     ?   Behavior: Behavior normal.     ?   Thought Content: Thought content normal. Thought content is not paranoid or delusional. Thought content does not include homicidal or suicidal ideation. Thought content does not include homicidal or suicidal plan.     ?   Cognition and Memory: Cognition and memory normal.     ?   Judgment: Judgment normal.  ?   Comments: Insight intact  ? ? ?Lab Review:  ?   ?Component Value Date/Time  ? NA 136 08/14/2021 1415  ? K 3.7 08/14/2021 1415  ? CL 105 08/14/2021 1415  ? CO2 23 08/14/2021 1415  ? GLUCOSE 90 08/14/2021 1415  ? BUN 15 08/14/2021 1415  ? CREATININE 0.87 08/14/2021 1415  ? CREATININE 0.96 05/14/2020 0806  ? CALCIUM 9.1 08/14/2021 1415  ? PROT 7.2 08/14/2021 1415  ? ALBUMIN 4.1 08/14/2021 1415  ? AST 18 08/14/2021 1415  ? ALT 20 08/14/2021 1415  ? ALKPHOS 55 08/14/2021 1415  ? BILITOT 0.7 08/14/2021 1415  ? GFRNONAA >60 08/14/2021 1415  ? GFRNONAA 87 05/14/2020 0806  ? GFRAA 101 05/14/2020 0806  ? ? ?   ?Component Value Date/Time  ? WBC 9.8 08/14/2021 1415  ? RBC 5.18 08/14/2021 1415  ? HGB 15.9 08/14/2021 1415  ? HCT 47.4 08/14/2021 1415  ? PLT 203 08/14/2021 1415  ? MCV 91.5 08/14/2021 1415  ? MCH 30.7 08/14/2021 1415  ? MCHC 33.5 08/14/2021 1415  ? RDW 12.0 08/14/2021 1415  ? LYMPHSABS  1.9 08/14/2021 1415  ? MONOABS 0.5 08/14/2021 1415  ? EOSABS 0.1 08/14/2021 1415  ? BASOSABS 0.1 08/14/2021 1415  ? ? ?No results found for: POCLITH, LITHIUM  ? ?Lab Results  ?Component Value Date  ? VALPROA

## 2021-12-26 ENCOUNTER — Ambulatory Visit (INDEPENDENT_AMBULATORY_CARE_PROVIDER_SITE_OTHER): Payer: Medicare PPO | Admitting: Psychiatry

## 2021-12-26 ENCOUNTER — Encounter: Payer: Self-pay | Admitting: Psychiatry

## 2021-12-26 DIAGNOSIS — F3181 Bipolar II disorder: Secondary | ICD-10-CM | POA: Diagnosis not present

## 2021-12-26 DIAGNOSIS — F410 Panic disorder [episodic paroxysmal anxiety] without agoraphobia: Secondary | ICD-10-CM | POA: Diagnosis not present

## 2021-12-26 MED ORDER — CLONAZEPAM 0.5 MG PO TABS: ORAL_TABLET | ORAL | 2 refills | Status: DC

## 2021-12-26 MED ORDER — QUETIAPINE FUMARATE ER 300 MG PO TB24: 300.0000 mg | ORAL_TABLET | Freq: Every evening | ORAL | 0 refills | Status: DC

## 2021-12-26 MED ORDER — SERTRALINE HCL 50 MG PO TABS: 50.0000 mg | ORAL_TABLET | Freq: Every day | ORAL | 0 refills | Status: DC

## 2021-12-26 NOTE — Progress Notes (Signed)
Chase Hunter. ?[id number] ?October 30, 1960 ?61 y.o. ? ?Subjective:  ? ?Patient ID:  Chase Hunter. is a 61 y.o. (DOB 08/25/1961) male. ? ?Chief Complaint:  ?Chief Complaint  ?Patient presents with  ? Follow-up  ?  Anxiety, mood disturbance  ? ? ?HPI ?Chase Hunter. presents to the office today for follow-up of anxiety and mood disturbance. He reports that he feels that increase in Sertraline has been helpful.  ?He notices some cycling- "getting a little depressed and getting a little hyper." He reports that mood cycling lasts about 4 days. Denies any worsening in mood cycling or manic symptoms. He reports that his highs and lows are not as severe. He reports that he has some anxiety that is not as severe and is manageable. He reports that he has not been able to identify any triggers to anxiety. Denies panic attacks. Reports that he has had periods of heightened anxiety where he feels uneasy/nervous. He reports that worry and intrusive thoughts have improved since Seroquel. Sleeping well if he takes Seroquel XR earlier in the evening. Appetite has been ok. Energy and motivation have been good. Has been enjoying music with a group and at church. Has been enjoying new grandson. Denies mind racing or intrusive thoughts. He has infrequent suicidal thoughts. Denies suicidal intent.  ? ?Past Psychiatric Medication Trials: ?Lamictal- Rash. Had seizure when it was stopped.  ?Depakote- Pancreatitis ?Lithium- Family commented that he "seemed like a zombie" ?Vraylar- Started about 6 weeks ago. ?Abilify- cannot recall response. Minimal improvement. ?Seroquel-Affective dulling, excessive somnolence. Took up to 600 mg QHS ?Saphris- SI, uncontrolled crying ?Latuda ?Trazodone- Increased BP and suicidal thoughts ?Propranolol- Raised BP ?Sertraline ?Lexapro ?Wellbutrin ?Duloxetine-excessively talkative. "Saying things out loud that I thought I was just thinking."  ?Buspar-Increased anxiety and increased BP.   ? ? ?AIMS   ? ?[facility] Visit from 10/09/2021 in [facility] Office Visit from 08/12/2021 in [facility]  ?AIMS Total Score 0 0  ? ?  ? ?PHQ2-9   ? ?[facility] Office Visit from 08/16/2021 in [facility] at [facility] from 06/14/2021 in [facility] at [facility] from 06/13/2020 in [facility] at [facility]  ?PHQ-2 Total Score 5 2 0  ?PHQ-9 Total Score 20 12 --  ? ?  ? ?[facility] from 08/14/2021 in [facility] [facility] from 06/14/2021 in [facility] at [facility]  ?C-SSRS RISK CATEGORY No Risk Error: Q3, 4, or 5 should not be populated when Q2 is No  ? ?  ?  ? ?Review of Systems:  ?Review of Systems  ?Musculoskeletal:  Negative for gait problem.  ?Neurological:  Negative for tremors.  ?Psychiatric/Behavioral:    ?     Please refer to HPI  ?Sees new PCP next week.  ? ?Medications: I have reviewed the patient's current medications. ? ?Current Outpatient Medications  ?Medication Sig Dispense Refill  ? albuterol (PROVENTIL) (2.5 MG/3ML) 0.083% nebulizer solution Take 2.5 mg by nebulization every 6 (six) hours as needed for wheezing or shortness of breath.     ? albuterol (VENTOLIN HFA) 108 (90 Base) MCG/ACT inhaler Inhale 2 puffs into the lungs every 6 (six) hours as needed for wheezing or shortness of breath. 18 g 1  ? amLODipine (NORVASC) 5 MG tablet Take 0.5 tablets (2.5 mg total) by mouth at bedtime. 30 tablet 1  ? Cholecalciferol (VITAMIN D3) 125 MCG (5000 UT) TABS Take 5,000 Units by mouth  daily.     ? [START ON 01/10/2022] clonazePAM (KLONOPIN) 0.5 MG tablet Take 1/2-1 tablet BY MOUTH EVERY DAY AS NEEDED FOR anxiety AND panic. 30 tablet 2  ? fluticasone (FLONASE) 50 MCG/ACT nasal spray Place into both nostrils daily as needed.    ? fluticasone-salmeterol (ADVAIR) 250-50 MCG/ACT AEPB Inhale 1 puff into the lungs in the morning and at bedtime. (Patient not  taking: Reported on 09/05/2021)    ? INSULIN SYRINGE 1CC/29G (EXEL COMFORT POINT INSULIN SYR) 29G X 1/2" 1 ML MISC Inject testosterone 0.5 ml weekly 100 each 2  ? L-Methylfolate-Algae (DEPLIN 15) 15-90.314 MG CAPS Take 15 mg by mouth daily. 90 capsule 3  ? lisinopril (ZESTRIL) 40 MG tablet Take 1 tablet (40 mg total) by mouth daily. 90 tablet 2  ? Omega-3 Fatty Acids (FISH OIL) 1200 MG CAPS Take 1,200 mg by mouth daily.     ? QUEtiapine (SEROQUEL XR) 300 MG 24 hr tablet Take 1 tablet (300 mg total) by mouth every evening. 90 tablet 0  ? sertraline (ZOLOFT) 50 MG tablet Take 1 tablet (50 mg total) by mouth daily. 90 tablet 0  ? testosterone cypionate (DEPOTESTOSTERONE CYPIONATE) 200 MG/ML injection Inject 0.5 mLs (100 mg total) into the muscle once a week. 2.5 mL 3  ? Vitamins-Lipotropics (LIPOFLAVONOID PO) Take by mouth.    ? ?No current facility-administered medications for this visit.  ? ? ?Medication Side Effects: None ? ?Allergies:  ?Allergies  ?Allergen Reactions  ? Cephalexin Hives  ? Depakote [Divalproex Sodium]   ?  Caused Pancreatitis  ? Trazodone And Nefazodone   ?  Suicidal thoughts  ? Lamictal [Lamotrigine] Rash  ?  Had a seizure when he stopped it  ? Lipitor [Atorvastatin] Rash  ?  Other reaction(s): Other (See Comments) ?Memory issues that have not resolved  ? ? ?Past Medical History:  ?Diagnosis Date  ? Allergy   ? Anemia   ? Asthma   ? Bipolar II disorder ([place])   ? Depression   ? Gallstones   ? GERD (gastroesophageal reflux disease)   ? Hx of adenomatous colonic polyps   ? Hyperlipidemia   ? Hypertension   ? Pancreatitis   ? Seizures ([place])   ? past hx 6 yrs ago x 1- coming off meds caused 1 seizure   ? ? ?Past Medical History, Surgical history, Social history, and Family history were reviewed and updated as appropriate.  ? ?Please see review of systems for further details on the patient's review from today.  ? ?Objective:  ? ?Physical Exam:  ?There were no vitals taken for this visit. ? ?Physical  Exam ?Constitutional:   ?   General: He is not in acute distress. ?Musculoskeletal:     ?   General: No deformity.  ?Neurological:  ?   Mental Status: He is alert and oriented to person, place, and time.  ?   Coordination: Coordination normal.  ?Psychiatric:     ?   Attention and Perception: Attention and perception normal. He does not perceive auditory or visual hallucinations.     ?   Mood and Affect: Mood is not anxious. Affect is not labile, blunt, angry or inappropriate.     ?   Speech: Speech normal.     ?   Behavior: Behavior normal.     ?   Thought Content: Thought content normal. Thought content is not paranoid or delusional. Thought content does not include homicidal or suicidal ideation. Thought content does not include  homicidal or suicidal plan.     ?   Cognition and Memory: Cognition and memory normal.     ?   Judgment: Judgment normal.  ?   Comments: Insight intact ?Mood presents as less depressed compared to last exam  ? ? ?Lab Review:  ?   ?Component Value Date/Time  ? NA 136 08/14/2021 1415  ? K 3.7 08/14/2021 1415  ? CL 105 08/14/2021 1415  ? CO2 23 08/14/2021 1415  ? GLUCOSE 90 08/14/2021 1415  ? BUN 15 08/14/2021 1415  ? CREATININE 0.87 08/14/2021 1415  ? CREATININE 0.96 05/14/2020 0806  ? CALCIUM 9.1 08/14/2021 1415  ? PROT 7.2 08/14/2021 1415  ? ALBUMIN 4.1 08/14/2021 1415  ? AST 18 08/14/2021 1415  ? ALT 20 08/14/2021 1415  ? ALKPHOS 55 08/14/2021 1415  ? BILITOT 0.7 08/14/2021 1415  ? GFRNONAA >60 08/14/2021 1415  ? GFRNONAA 87 05/14/2020 0806  ? GFRAA 101 05/14/2020 0806  ? ? ?   ?Component Value Date/Time  ? WBC 9.8 08/14/2021 1415  ? RBC 5.18 08/14/2021 1415  ? HGB 15.9 08/14/2021 1415  ? HCT 47.4 08/14/2021 1415  ? PLT 203 08/14/2021 1415  ? MCV 91.5 08/14/2021 1415  ? MCH 30.7 08/14/2021 1415  ? MCHC 33.5 08/14/2021 1415  ? RDW 12.0 08/14/2021 1415  ? LYMPHSABS 1.9 08/14/2021 1415  ? MONOABS 0.5 08/14/2021 1415  ? EOSABS 0.1 08/14/2021 1415  ? BASOSABS 0.1 08/14/2021 1415  ? ? ?No  results found for: POCLITH, LITHIUM  ? ?Lab Results  ?Component Value Date  ? VALPROATE 33.1 (L) 07/15/2019  ?  ? ?.res ?Assessment: Plan:   ?Pt seen for 25 minutes and time spent discussing mood charting. He reports tha

## 2021-12-31 ENCOUNTER — Ambulatory Visit (INDEPENDENT_AMBULATORY_CARE_PROVIDER_SITE_OTHER): Payer: Medicare PPO | Admitting: Psychiatry

## 2021-12-31 DIAGNOSIS — F3181 Bipolar II disorder: Secondary | ICD-10-CM | POA: Diagnosis not present

## 2021-12-31 NOTE — Progress Notes (Signed)
[facility] Counselor Initial Adult Exam ? ?Name: Chase Hunter. ?Date: 12/31/2021 ?MRN: [id number] ?DOB: [birth year] ?PCP: [place], Betty G, MD ? ?Time spent: 60 minutes ? ?Guardian/Payee:  patient   ? ?Paperwork requested:  No  ? ?Reason for Visit /Presenting Problem:  "extremity of moods" some days doing most anything and other days not able to do very much; went through a rough time but has been stable more recently and wanting to be in therapy to further work on issues and maintain my gains. ? ?Mental Status Exam: ?  ? ?Appearance:   Casual     ?Behavior:  Appropriate, Sharing, and Motivated  ?Motor:  Normal  ?Speech/Language:   Clear and Coherent  ?Affect:  Depressed and anxious and "I tend  to swing big and small swings"  ?Mood:  anxious and depressed  ?Thought process:  goal directed  ?Thought content:    Rumination and some obsessiveness  ?Sensory/Perceptual disturbances:    WNL  ?Orientation:  oriented to person, place, time/date, situation, day of week, month of year, year, and stated date of Dec 31, 2021  ?Attention:  Good  ?Concentration:  Good  ?Memory:  WNL  ?Fund of knowledge:   Good  ?Insight:    Good  ?Judgment:   Good  ?Impulse Control:  Good and Fair  ? ?Reported Symptoms:  see symptoms noted above ? ?Risk Assessment: ?Danger to Self:  No ?Self-injurious Behavior: No ?Danger to Others: No ?Duty to Warn:no ?Physical Aggression / Violence:No  ?Access to Firearms a concern: No  ?Gang Involvement:No  ?Patient / guardian was educated about steps to take if suicide or homicide risk level increases between visits: Denies any SI or HI.  States sometimes has them but I would never do anything to hard myself because of myfamily and I know it's not a good solution.  ?While future psychiatric events cannot be accurately predicted, the patient does not currently require acute inpatient psychiatric care and does not currently meet [facility] involuntary commitment criteria. ? ?Substance Abuse  History: ?Current substance abuse: No    ? ?Past Psychiatric History:   ?Previous psychological history is significant for Bipolar II ?Outpatient Providers:Jessica Eulas Post, NP ?History of Psych Hospitalization: Yes  ?Psychological Testing:  n/a   ? ?Abuse History: ?Victim of No.,  n/a    ?Report needed: No. ?Victim of Neglect:No. ?Perpetrator of  n/a   ?Witness / Exposure to Domestic Violence: No   ?Protective Services Involvement: No  ?Witness to [employer] Violence:   n/a ? ?Family History: Reviewed with patient and he confirms info below. ?Family History  ?Problem Relation Age of Onset  ? Atrial fibrillation Mother   ? Anxiety disorder Mother   ? Obesity Mother   ? Mood Disorder Mother   ? Anxiety disorder Father   ? Prostate cancer Father   ? Brain cancer Father   ? Anxiety disorder Brother   ? Anxiety disorder Daughter   ? Colon cancer Neg Hx   ? Colon polyps Neg Hx   ? Esophageal cancer Neg Hx   ? Rectal cancer Neg Hx   ? Stomach cancer Neg Hx   ? ? ?Living situation: the patient lives with their spouse, married for 84 yrs. Patient formerly taught music in elementary school. Wife also retired. Have 17 yr old daughter, married with 1 infant grandson. Close relationship with daughter. Close with neighbors and church family. Participates in music program at church.  ? ?Sexual Orientation:  Straight ? ?Relationship Status: married  ?  Name of spouse / other:n/a ?            If a parent, number of children / ages:1 daughter,who lives nearby and they have 1 new grandson. ? ?Support Systems; spouse, friends, daughter, and church friends ? ?Financial Stress:  No  ? ?Income/Employment/Disability: Pension ? ?Military Service: No  ? ?Educational History: ?Education: post Forensic psychologist work or degree ? ?Religion/Sprituality/World View:   [employer]) ? ?Any cultural differences that may affect / interfere with treatment:  not applicable  ? ?Recreation/Hobbies: playing music and being with  family ? ?Stressors:Health problems   ? ?Strengths:  Supportive Relationships, Family, Friends, Church, Spirituality, Hopefulness, and Able to Communicate Effectively ? ?Barriers:  "If I stop trying and working on goals, and lose the will to get better, then I won't get and stay better. It takes effort." ? ?Legal History: ?Pending legal issue / charges: The patient has no significant history of legal issues. ?History of legal issue / charges:  n/a ? ?Medical History/Surgical History:Reviewed and is correct except patient says he's never had gallstones. ?Past Medical History:  ?Diagnosis Date  ? Allergy   ? Anemia   ? Asthma   ? Bipolar II disorder ([place])   ? Depression   ? Gallstones   ? GERD (gastroesophageal reflux disease)   ? Hx of adenomatous colonic polyps   ? Hyperlipidemia   ? Hypertension   ? Pancreatitis   ? Seizures ([place])   ? past hx 6 yrs ago x 1- coming off meds caused 1 seizure   ? ? ?Past Surgical History:  ?Procedure Laterality Date  ? COLONOSCOPY    ? ESOPHAGOGASTRODUODENOSCOPY N/A 07/19/2020  ? Procedure: ESOPHAGOGASTRODUODENOSCOPY (EGD);  Surgeon: Milus Banister, MD;  Location: [facility];  Service: Endoscopy;  Laterality: N/A;  ? EUS N/A 07/19/2020  ? Procedure: UPPER ENDOSCOPIC ULTRASOUND (EUS) RADIAL;  Surgeon: Milus Banister, MD;  Location: [facility];  Service: Endoscopy;  Laterality: N/A;  ? POLYPECTOMY    ? ? ?Medications: Patient confirms. ?Current Outpatient Medications  ?Medication Sig Dispense Refill  ? albuterol (PROVENTIL) (2.5 MG/3ML) 0.083% nebulizer solution Take 2.5 mg by nebulization every 6 (six) hours as needed for wheezing or shortness of breath.     ? albuterol (VENTOLIN HFA) 108 (90 Base) MCG/ACT inhaler Inhale 2 puffs into the lungs every 6 (six) hours as needed for wheezing or shortness of breath. 18 g 1  ? amLODipine (NORVASC) 5 MG tablet Take 0.5 tablets (2.5 mg total) by mouth at bedtime. 30 tablet 1  ? Cholecalciferol (VITAMIN D3) 125 MCG (5000 UT) TABS Take  5,000 Units by mouth daily.     ? [START ON 01/10/2022] clonazePAM (KLONOPIN) 0.5 MG tablet Take 1/2-1 tablet BY MOUTH EVERY DAY AS NEEDED FOR anxiety AND panic. 30 tablet 2  ? fluticasone (FLONASE) 50 MCG/ACT nasal spray Place into both nostrils daily as needed.    ? fluticasone-salmeterol (ADVAIR) 250-50 MCG/ACT AEPB Inhale 1 puff into the lungs in the morning and at bedtime. (Patient not taking: Reported on 09/05/2021)    ? INSULIN SYRINGE 1CC/29G (EXEL COMFORT POINT INSULIN SYR) 29G X 1/2" 1 ML MISC Inject testosterone 0.5 ml weekly 100 each 2  ? L-Methylfolate-Algae (DEPLIN 15) 15-90.314 MG CAPS Take 15 mg by mouth daily. 90 capsule 3  ? lisinopril (ZESTRIL) 40 MG tablet Take 1 tablet (40 mg total) by mouth daily. 90 tablet 2  ? Omega-3 Fatty Acids (FISH OIL) 1200 MG CAPS Take 1,200 mg by  mouth daily.     ? QUEtiapine (SEROQUEL XR) 300 MG 24 hr tablet Take 1 tablet (300 mg total) by mouth every evening. 90 tablet 0  ? sertraline (ZOLOFT) 50 MG tablet Take 1 tablet (50 mg total) by mouth daily. 90 tablet 0  ? testosterone cypionate (DEPOTESTOSTERONE CYPIONATE) 200 MG/ML injection Inject 0.5 mLs (100 mg total) into the muscle once a week. 2.5 mL 3  ? Vitamins-Lipotropics (LIPOFLAVONOID PO) Take by mouth.    ? ?No current facility-administered medications for this visit.  ? ? ?Allergies  ?Allergen Reactions  ? Cephalexin Hives  ? Depakote [Divalproex Sodium]   ?  Caused Pancreatitis  ? Trazodone And Nefazodone   ?  Suicidal thoughts  ? Lamictal [Lamotrigine] Rash  ?  Had a seizure when he stopped it  ? Lipitor [Atorvastatin] Rash  ?  Other reaction(s): Other (See Comments) ?Memory issues that have not resolved  ? ? ?Diagnoses:  ?  ICD-10-CM   ?1. Bipolar II disorder ([place])  F31.81   ?  ? ?Treatment goal plan: ?Patient not signing treatment goal plan on computer screen due to [place].  Reviewed each goal thoroughly with patient. ?Treatment goals: ?Treatment goals remain on treatment plan as patient works with strategies  to achieve his goals.  Progress is assessed each session and documented in the "subject" and/or "plan" sections of treatment note. ?Long-term goal: ?Reduce overall level, frequency, and intensity of the depression and anxiety so tha

## 2022-01-31 ENCOUNTER — Encounter: Payer: Self-pay | Admitting: Psychiatry

## 2022-01-31 ENCOUNTER — Ambulatory Visit (INDEPENDENT_AMBULATORY_CARE_PROVIDER_SITE_OTHER): Payer: Medicare PPO | Admitting: Psychiatry

## 2022-01-31 DIAGNOSIS — F410 Panic disorder [episodic paroxysmal anxiety] without agoraphobia: Secondary | ICD-10-CM | POA: Diagnosis not present

## 2022-01-31 DIAGNOSIS — F3181 Bipolar II disorder: Secondary | ICD-10-CM | POA: Diagnosis not present

## 2022-01-31 MED ORDER — QUETIAPINE FUMARATE ER 400 MG PO TB24: 400.0000 mg | ORAL_TABLET | Freq: Every evening | ORAL | 0 refills | Status: DC

## 2022-01-31 MED ORDER — SERTRALINE HCL 50 MG PO TABS: ORAL_TABLET | ORAL | 0 refills | Status: DC

## 2022-01-31 NOTE — Progress Notes (Signed)
Chase Hunter [id number] September 18, 1960 61 y.o.  Subjective:   Patient ID:  Chase Hunter. is a 61 y.o. (DOB 1960/10/26) male.  Chief Complaint:  Chief Complaint  Patient presents with   Anxiety    HPI Chase Hunter. presents to the office today for follow-up of anxiety and mood disturbance.   He reports that his baseline anxiety has been steadily increasing and was using Klonopin more (less than once daily). He decided to stop Klonopin and questions if he may be having "rebound anxiety." He had a severe panic attack a few days ago.   He reports that he is constantly sweating, heart pounding, and difficulty eating.  He has been making himself eat. He reports that he has been having disturbed sleep and will awaken from sleep with anxiety. He reports that that he is sleeping about 7 hours a night. He has been using benadryl prn to sleep. He has been napping some. Denies anxious thoughts. Denies racing thoughts. Denies any manic s/s. Denies impulsivity or risky behavior. Denies sad mood. He reports that he feels "wired, but tired." Motivation is ok. Concentration is ok. He reports that he has had some suicidal thoughts in response to anxiety. He denies suicidal intent- "I can't do that... I'm not going to do it." Contracts for safety.   He reports that he is under some stress. 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   Past Psychiatric Medication Trials: Lamictal- Rash. Had seizure when it was stopped.  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 Abilify- cannot recall response. Minimal improvement. Seroquel-Affective dulling, excessive somnolence. Took up to 600 mg QHS Saphris- SI, uncontrolled crying Latuda Trazodone- Increased BP and suicidal thoughts Propranolol- Raised BP Sertraline Lexapro Wellbutrin Duloxetine-excessively talkative. "Saying things out loud that I  thought I was just thinking."  Buspar-Increased anxiety and increased BP.  [place] Office Visit from 10/09/2021 in [place] Office Visit from 08/12/2021 in [place] Total Score 0 0      PHQ2-9    [place] Office Visit from 08/16/2021 in [place] at [facility] from 06/14/2021 in [place] at [facility] from 06/13/2020 in [place] at [facility] Total Score 5 2 0  PHQ-9 Total Score 20 12 --      Flowsheet Row ED from 08/14/2021 in [place] from 06/14/2021 in [place] at [facility] No Risk Error: Q3, 4, or 5 should not be populated when Q2 is No        Review of Systems:  Review of Systems  Respiratory:  Positive for chest tightness. Negative for shortness of breath.   Musculoskeletal:  Negative for gait problem.  Neurological:  Negative for tremors.  Psychiatric/Behavioral:         Please refer to HPI   Medications: I have reviewed the patient's current medications.  Current Outpatient Medications  Medication Sig Dispense Refill   albuterol (PROVENTIL) (2.5 MG/3ML) 0.083% nebulizer solution Take 2.5 mg by nebulization every 6 (six) hours as needed for wheezing or shortness of breath.      albuterol (VENTOLIN HFA) 108 (90 Base) MCG/ACT inhaler Inhale 2 puffs into the lungs every 6 (six) hours as needed for wheezing or shortness of breath. 18 g 1   amLODipine (NORVASC) 5 MG  tablet Take 0.5 tablets (2.5 mg total) by mouth at bedtime. 30 tablet 1   Cholecalciferol (VITAMIN D3) 125 MCG (5000 UT) TABS Take 5,000 Units by mouth daily.      fluticasone (FLONASE) 50 MCG/ACT nasal spray Place into both nostrils daily as needed.     lisinopril (ZESTRIL) 40 MG tablet Take 1 tablet (40 mg total) by mouth daily. 90 tablet 2   Omega-3 Fatty Acids (FISH OIL) 1200 MG  CAPS Take 1,200 mg by mouth daily.      testosterone cypionate (DEPOTESTOSTERONE CYPIONATE) 200 MG/ML injection Inject 0.5 mLs (100 mg total) into the muscle once a week. 2.5 mL 3   Vitamins-Lipotropics (LIPOFLAVONOID PO) Take by mouth.     clonazePAM (KLONOPIN) 0.5 MG tablet Take 1/2-1 tablet BY MOUTH EVERY DAY AS NEEDED FOR anxiety AND panic. (Patient not taking: Reported on 01/31/2022) 30 tablet 2   fluticasone-salmeterol (ADVAIR) 250-50 MCG/ACT AEPB Inhale 1 puff into the lungs in the morning and at bedtime. (Patient not taking: Reported on 09/05/2021)     INSULIN SYRINGE 1CC/29G (EXEL COMFORT POINT INSULIN SYR) 29G X 1/2" 1 ML MISC Inject testosterone 0.5 ml weekly 100 each 2   L-Methylfolate-Algae (DEPLIN 15) 15-90.314 MG CAPS Take 15 mg by mouth daily. 90 capsule 3   QUEtiapine (SEROQUEL XR) 400 MG 24 hr tablet Take 1 tablet (400 mg total) by mouth every evening. 30 tablet 0   sertraline (ZOLOFT) 50 MG tablet Take 1/2 tablet daily for 4 days, then stop. 90 tablet 0   No current facility-administered medications for this visit.    Medication Side Effects: Other: Possible increased anxiety  Allergies:  Allergies  Allergen Reactions   Cephalexin Hives   Depakote [Divalproex Sodium]     Caused Pancreatitis   Trazodone And Nefazodone     Suicidal thoughts   Lamictal [Lamotrigine] Rash    Had a seizure when he stopped it   Lipitor [Atorvastatin] Rash    Other reaction(s): Other (See Comments) Memory issues that have not resolved    Past Medical History:  Diagnosis Date   Allergy    Anemia    Asthma    Bipolar II disorder ([place])    Depression    Gallstones    GERD (gastroesophageal reflux disease)    Hx of adenomatous colonic polyps    Hyperlipidemia    Hypertension    Pancreatitis    Seizures (HCC)    past hx 6 yrs ago x 1- coming off meds caused 1 seizure     Past Medical History, Surgical history, Social history, and Family history were reviewed and updated as  appropriate.   Please see review of systems for further details on the patient's review from today.   Objective:   Physical Exam:  There were no vitals taken for this visit.  Physical Exam Constitutional:      General: He is not in acute distress. Musculoskeletal:        General: No deformity.  Neurological:     Mental Status: He is alert and oriented to person, place, and time.     Coordination: Coordination normal.  Psychiatric:        Attention and Perception: Attention and perception normal. He does not perceive auditory or visual hallucinations.        Mood and Affect: Mood is anxious. Mood is not depressed. Affect is not labile, blunt, angry or inappropriate.        Speech: Speech normal.  Behavior: Behavior normal.        Thought Content: Thought content normal. Thought content is not paranoid or delusional. Thought content does not include homicidal or suicidal ideation. Thought content does not include homicidal or suicidal plan.        Cognition and Memory: Cognition and memory normal.        Judgment: Judgment normal.     Comments: Insight intact    Lab Review:     Component Value Date/Time   NA 136 08/14/2021 1415   K 3.7 08/14/2021 1415   CL 105 08/14/2021 1415   CO2 23 08/14/2021 1415   GLUCOSE 90 08/14/2021 1415   BUN 15 08/14/2021 1415   CREATININE 0.87 08/14/2021 1415   CREATININE 0.96 05/14/2020 0806   CALCIUM 9.1 08/14/2021 1415   PROT 7.2 08/14/2021 1415   ALBUMIN 4.1 08/14/2021 1415   AST 18 08/14/2021 1415   ALT 20 08/14/2021 1415   ALKPHOS 55 08/14/2021 1415   BILITOT 0.7 08/14/2021 1415   GFRNONAA >60 08/14/2021 1415   GFRNONAA 87 05/14/2020 0806   GFRAA 101 05/14/2020 0806       Component Value Date/Time   WBC 9.8 08/14/2021 1415   RBC 5.18 08/14/2021 1415   HGB 15.9 08/14/2021 1415   HCT 47.4 08/14/2021 1415   PLT 203 08/14/2021 1415   MCV 91.5 08/14/2021 1415   MCH 30.7 08/14/2021 1415   MCHC 33.5 08/14/2021 1415   RDW 12.0  08/14/2021 1415   LYMPHSABS 1.9 08/14/2021 1415   MONOABS 0.5 08/14/2021 1415   EOSABS 0.1 08/14/2021 1415   BASOSABS 0.1 08/14/2021 1415    No results found for: POCLITH, LITHIUM   Lab Results  Component Value Date   VALPROATE 33.1 (L) 07/15/2019     .res Assessment: Plan:   Pt seen for 25 minutes and time spent discussing treatment plan. Agreed that Sertraline may be causing some increased anxiety and activation. Plan is to decrease Sertraline to 25 mg daily for 4 days, then stop. Discussed discontinuation signs and symptoms. Discussed anxiety is unlikely to be result of benzodiazepine withdrawal since he has been taking Klonopin at low dose and not daily.  Will increase Seroquel XR to 400 mg daily to improve anxiety.  Pt reports that he prefers not to take Klonopin if possible. 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  Pt to follow-up in 2 weeks or sooner if clinically indicated.  Patient advised to contact office with any questions, adverse effects, or acute worsening in signs and symptoms. Recommend continuing therapy with Rinaldo Cloud, LCSW.   Chase Hunter was seen today for anxiety.  Diagnoses and all orders for this visit:  Panic -     sertraline (ZOLOFT) 50 MG tablet; Take 1/2 tablet daily for 4 days, then stop.  Bipolar II disorder (HCC) -     sertraline (ZOLOFT) 50 MG tablet; Take 1/2 tablet daily for 4 days, then stop. -     QUEtiapine (SEROQUEL XR) 400 MG 24 hr tablet; Take 1 tablet (400 mg total) by mouth every evening.     Please see After Visit Summary for patient specific instructions.  Future Appointments  Date Time Provider [place]  02/05/2022  9:00 AM Shanon Ace, LCSW CP-[facility] None  02/14/2022 11:30 AM Thayer Headings, PMHNP CP-CP None    No orders of the defined types were placed in this encounter.   -------------------------------

## 2022-02-05 ENCOUNTER — Ambulatory Visit: Payer: Medicare PPO | Admitting: Psychiatry

## 2022-02-05 DIAGNOSIS — F3181 Bipolar II disorder: Secondary | ICD-10-CM

## 2022-02-05 NOTE — Progress Notes (Signed)
[facility] Counselor/Therapist Progress Note  Patient ID: Chase Fury., MRN: [id number],    Date: 02/05/2022  Time Spent: 48 minutes   Treatment Type: Individual Therapy  Reported Symptoms: increased anxiety and it was felt that is was med-related and he is weaning off that medication. "That has been really gripping my attention but now that we know it's medicine induced, I'm coming off that med." Typically I can get very obssessive but that's not been as bad. Had a really bad panic attack about 3 wks but no more since that time. Today, states he's feeling better, "pretty good".   Currently some anxiety and mild depression.        Mental Status Exam:  Appearance:   Casual     Behavior:  Appropriate, Sharing, and Motivated  Motor:  Normal  Speech/Language:   Clear and Coherent  Affect:  anxious  Mood:  anxious and depressed  Thought process:  goal directed  Thought content:    WNL  Sensory/Perceptual disturbances:    WNL  Orientation:  oriented to person, place, time/date, situation, day of week, month of year, year, and stated date of February 05, 2022  Attention:  Good  Concentration:  Good  Memory:  Some short term memory concerns when more stressed  Fund of knowledge:   Good  Insight:    Good  Judgment:   Good  Impulse Control:  Good   Risk Assessment: Danger to Self:  No Self-injurious Behavior: No Danger to Others: No Duty to Warn:no Physical Aggression / Violence:No  Access to Firearms a concern: No  Gang Involvement:No   Subjective:  Patient in today reporting anxiety and some mild depression. "Feeling good that he is recognizing triggers to his heightened emotions and trying to work with those situations more effectively". Feels that helps him manage thoughts and behaviors better.  Learning to set limits and boundaries in his commitments. Recognizes that he gets more heightened and sometimes agitated in certain situations so is learning how to calm himself  more and dial things back a bit . Processing today a lot of his experiences when with schools and beyond. Trying to stop "looking for something to worry about."  Strengths emphasized with patient including his insight, spirituality, strong support system, his hopefulness, grateful that he is well connected with others, recognizing his strengths, hopefulness, supportive relationships, church, and family.  Still displaying some obsessiveness but not as much as previously.  Remains goal directed.  Interventions: Ego-Supportive and Insight-Oriented  Treatment goal plan: Patient not signing treatment goal plan on computer screen due to COVID.  Reviewed each goal thoroughly with patient. Treatment goals: Treatment goals remain on treatment plan as patient works with strategies to achieve his goals.  Progress is assessed each session and documented in the "subject" and/or "plan" sections of treatment note. Long-term goal: Reduce overall level, frequency, and intensity of the depression and anxiety so that daily functioning is not impaired. Short-term goal: Verbalize an understanding of the role that fearful thinking plays in creating fears, excessive worry, and persistent anxiety/depressive symptoms. Strategies: Identify, challenge, and replace fearful self-talk with positive, realistic, and empowering self-talk.   Diagnosis:   ICD-10-CM   1. Bipolar II disorder ([place])  F31.81      Plan:  Patient today showing good motivation and participation in session as he worked on his anxiety and some mild depression.  Shared his challenges experienced since last appointment, but also noted some positives that have come out  of that including as noted above, his realization that he needs to set some boundaries in his commitments and is in the process of doing that now.  He sees this as important growth for him at this point and remains very grateful that he is involved in the different groups in which she is  involved and able to use his talents especially in music.  Noted today that he realized also he "looks for something to worry about" and is trying to decrease that tendency. Encouraged patient in his practice of positive behaviors including: Staying in the present and focusing on what he can change, getting outside daily, staying in touch with people who are supportive, look for more positives versus negatives each day, remain on his prescribed medication, healthy nutrition and exercise, positive self talk, daily affirmations, allow his faith to be an emotional support as well as spiritual, and recognize the strength he shows working with goal directed behaviors to move in a direction that supports his improved emotional health.  Goal review and progress/challenges noted with patient.  Next appointment within 3 to 4 weeks.  This record has been created using [employer].  Chart creation errors have been sought, but may not always have been located and corrected.  Such creation errors do not reflect on the standard of medical care provided.   Shanon Ace, LCSW

## 2022-02-14 ENCOUNTER — Ambulatory Visit (INDEPENDENT_AMBULATORY_CARE_PROVIDER_SITE_OTHER): Payer: Medicare PPO | Admitting: Psychiatry

## 2022-02-14 ENCOUNTER — Encounter: Payer: Self-pay | Admitting: Psychiatry

## 2022-02-14 DIAGNOSIS — F3181 Bipolar II disorder: Secondary | ICD-10-CM | POA: Diagnosis not present

## 2022-02-14 DIAGNOSIS — F411 Generalized anxiety disorder: Secondary | ICD-10-CM

## 2022-02-14 MED ORDER — QUETIAPINE FUMARATE ER 400 MG PO TB24: 400.0000 mg | ORAL_TABLET | Freq: Every evening | ORAL | 0 refills | Status: DC

## 2022-02-14 NOTE — Progress Notes (Signed)
Camaron Cammack Ernie Kasler [id number] December 12, 1960 61 y.o.  Subjective:   Patient ID:  Chase Hunter. is a 61 y.o. (DOB [birth year]) male.  Chief Complaint:  Chief Complaint  Patient presents with   Follow-up    Anxiety, mood disturbance    HPI Slayde Brault. presents to the office today for follow-up of anxiety and mood disturbance. He reports that he noticed improved mood and anxiety after stopping Sertraline and increasing Seroquel XR. Denies depressed mood or manic s/s. He denies any current anxiety or panic. Denies feeling restless or nervous.   He reports that anxiety has improved with increased in Seroquel XR. He has some grogginess initially after awakening and this is improving and lasts less than an hour. Falling asleep and staying asleep without difficulty. Denies hypersomnia. Some improvement in appetite and is now eating normally. Concentration has been good. Denies anhedonia. Enjoys grandson. He reports chronic suicidal thoughts. Denies suicidal intent. Contracts for safety.   He reports that he has good support at home.   He has decided to step back from church once he is no longer filling in for someone else. He reports that it causes some increased stress.   Past Psychiatric Medication Trials: Lamictal- Rash. Had seizure when it was stopped.  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 Abilify- cannot recall response. Minimal improvement. Seroquel-Affective dulling, excessive somnolence. Took up to 600 mg QHS Saphris- SI, uncontrolled crying Latuda Trazodone- Increased BP and suicidal thoughts Propranolol- Raised BP Sertraline- Worsening mood symptoms and anxiety Lexapro Wellbutrin Duloxetine-excessively talkative. "Saying things out loud that I thought I was just thinking."  Buspar-Increased anxiety and increased BP.  [place] Office Visit from  10/09/2021 in [facility] Office Visit from 08/12/2021 in [place] Total Score 0 0      PHQ2-9    [place] Office Visit from 08/16/2021 in [place] at [facility] from 06/14/2021 in [place] at [facility] from 06/13/2020 in [place] at [facility] Total Score 5 2 0  PHQ-9 Total Score 20 12 --      Flowsheet Row ED from 08/14/2021 in [facility] from 06/14/2021 in [place] at [facility] No Risk Error: Q3, 4, or 5 should not be populated when Q2 is No        Review of Systems:  Review of Systems  Musculoskeletal:  Negative for gait problem.  Neurological:  Negative for tremors.  Psychiatric/Behavioral:         Please refer to HPI    Medications: I have reviewed the patient's current medications.  Current Outpatient Medications  Medication Sig Dispense Refill   albuterol (PROVENTIL) (2.5 MG/3ML) 0.083% nebulizer solution Take 2.5 mg by nebulization every 6 (six) hours as needed for wheezing or shortness of breath.      albuterol (VENTOLIN HFA) 108 (90 Base) MCG/ACT inhaler Inhale 2 puffs into the lungs every 6 (six) hours as needed for wheezing or shortness of breath. 18 g 1   amLODipine (NORVASC) 5 MG tablet Take 0.5 tablets (2.5 mg total) by mouth at bedtime. 30 tablet 1   Cholecalciferol (VITAMIN D3) 125 MCG (5000 UT) TABS Take 5,000 Units by mouth daily.      fluticasone (FLONASE) 50 MCG/ACT nasal spray Place into both nostrils daily as needed.  lisinopril (ZESTRIL) 40 MG tablet Take 1 tablet (40 mg total) by mouth daily. 90 tablet 2   Omega-3 Fatty Acids (FISH OIL) 1200 MG CAPS Take 1,200 mg by mouth daily.      testosterone cypionate (DEPOTESTOSTERONE CYPIONATE) 200 MG/ML injection Inject 0.5 mLs (100 mg total) into the muscle once a week. 2.5 mL 3   Vitamins-Lipotropics  (LIPOFLAVONOID PO) Take by mouth.     clonazePAM (KLONOPIN) 0.5 MG tablet Take 1/2-1 tablet BY MOUTH EVERY DAY AS NEEDED FOR anxiety AND panic. (Patient not taking: Reported on 01/31/2022) 30 tablet 2   fluticasone-salmeterol (ADVAIR) 250-50 MCG/ACT AEPB Inhale 1 puff into the lungs in the morning and at bedtime. (Patient not taking: Reported on 09/05/2021)     INSULIN SYRINGE 1CC/29G (EXEL COMFORT POINT INSULIN SYR) 29G X 1/2" 1 ML MISC Inject testosterone 0.5 ml weekly 100 each 2   L-Methylfolate-Algae (DEPLIN 15) 15-90.314 MG CAPS Take 15 mg by mouth daily. 90 capsule 3   QUEtiapine (SEROQUEL XR) 400 MG 24 hr tablet Take 1 tablet (400 mg total) by mouth every evening. 90 tablet 0   No current facility-administered medications for this visit.    Medication Side Effects: Other: Some somnolence  Allergies:  Allergies  Allergen Reactions   Cephalexin Hives   Depakote [Divalproex Sodium]     Caused Pancreatitis   Trazodone And Nefazodone     Suicidal thoughts   Lamictal [Lamotrigine] Rash    Had a seizure when he stopped it   Lipitor [Atorvastatin] Rash    Other reaction(s): Other (See Comments) Memory issues that have not resolved    Past Medical History:  Diagnosis Date   Allergy    Anemia    Asthma    Bipolar II disorder ([place])    Depression    Gallstones    GERD (gastroesophageal reflux disease)    Hx of adenomatous colonic polyps    Hyperlipidemia    Hypertension    Pancreatitis    Seizures (HCC)    past hx 6 yrs ago x 1- coming off meds caused 1 seizure     Past Medical History, Surgical history, Social history, and Family history were reviewed and updated as appropriate.   Please see review of systems for further details on the patient's review from today.   Objective:   Physical Exam:  There were no vitals taken for this visit.  Physical Exam Constitutional:      General: He is not in acute distress. Musculoskeletal:        General: No deformity.   Neurological:     Mental Status: He is alert and oriented to person, place, and time.     Coordination: Coordination normal.  Psychiatric:        Attention and Perception: Attention and perception normal. He does not perceive auditory or visual hallucinations.        Mood and Affect: Mood normal. Mood is not anxious or depressed. Affect is not labile, blunt, angry or inappropriate.        Speech: Speech normal.        Behavior: Behavior normal.        Thought Content: Thought content normal. Thought content is not paranoid or delusional. Thought content does not include homicidal or suicidal ideation. Thought content does not include homicidal or suicidal plan.        Cognition and Memory: Cognition and memory normal.        Judgment: Judgment normal.     Comments:  Insight intact     Lab Review:     Component Value Date/Time   NA 136 08/14/2021 1415   K 3.7 08/14/2021 1415   CL 105 08/14/2021 1415   CO2 23 08/14/2021 1415   GLUCOSE 90 08/14/2021 1415   BUN 15 08/14/2021 1415   CREATININE 0.87 08/14/2021 1415   CREATININE 0.96 05/14/2020 0806   CALCIUM 9.1 08/14/2021 1415   PROT 7.2 08/14/2021 1415   ALBUMIN 4.1 08/14/2021 1415   AST 18 08/14/2021 1415   ALT 20 08/14/2021 1415   ALKPHOS 55 08/14/2021 1415   BILITOT 0.7 08/14/2021 1415   GFRNONAA >60 08/14/2021 1415   GFRNONAA 87 05/14/2020 0806   GFRAA 101 05/14/2020 0806       Component Value Date/Time   WBC 9.8 08/14/2021 1415   RBC 5.18 08/14/2021 1415   HGB 15.9 08/14/2021 1415   HCT 47.4 08/14/2021 1415   PLT 203 08/14/2021 1415   MCV 91.5 08/14/2021 1415   MCH 30.7 08/14/2021 1415   MCHC 33.5 08/14/2021 1415   RDW 12.0 08/14/2021 1415   LYMPHSABS 1.9 08/14/2021 1415   MONOABS 0.5 08/14/2021 1415   EOSABS 0.1 08/14/2021 1415   BASOSABS 0.1 08/14/2021 1415    No results found for: "POCLITH", "LITHIUM"   Lab Results  Component Value Date   VALPROATE 33.1 (L) 07/15/2019     .res Assessment: Plan:    Pt seen for 25 minutes and time spent discussing response to discontinuation of Sertraline and increase in Seroquel XR to 400 mg. Discussed intrusive thoughts and strategies to manage these thoughts. Also provided pt with address and contact information for [place].  Continue Seroquel XR 400 mg po every evening for mood stabilization.  He reports that he plans to only use Klonopin prn if he experiences severe anxiety or panic and other coping strategies have been ineffective.  Recommend continuing therapy with Rinaldo Cloud, LCSW.  Pt to follow-up with this provider in 4-6 weeks or sooner if clinically indicated.  Patient advised to contact office with any questions, adverse effects, or acute worsening in signs and symptoms.   Deontae was seen today for follow-up.  Diagnoses and all orders for this visit:  Bipolar II disorder ([place]) -     QUEtiapine (SEROQUEL XR) 400 MG 24 hr tablet; Take 1 tablet (400 mg total) by mouth every evening.  Generalized anxiety disorder     Please see After Visit Summary for patient specific instructions.  Future Appointments  Date Time Provider [place]  03/06/2022  8:00 AM Shanon Ace, LCSW CP-CP None  03/21/2022  9:00 AM Thayer Headings, PMHNP CP-CP None    No orders of the defined types were placed in this encounter.   -------------------------------

## 2022-02-19 DIAGNOSIS — E785 Hyperlipidemia, unspecified: Secondary | ICD-10-CM | POA: Diagnosis not present

## 2022-02-19 DIAGNOSIS — E291 Testicular hypofunction: Secondary | ICD-10-CM | POA: Diagnosis not present

## 2022-02-19 DIAGNOSIS — Z125 Encounter for screening for malignant neoplasm of prostate: Secondary | ICD-10-CM | POA: Diagnosis not present

## 2022-03-06 ENCOUNTER — Ambulatory Visit (INDEPENDENT_AMBULATORY_CARE_PROVIDER_SITE_OTHER): Payer: Medicare PPO | Admitting: Psychiatry

## 2022-03-06 DIAGNOSIS — F3181 Bipolar II disorder: Secondary | ICD-10-CM | POA: Diagnosis not present

## 2022-03-06 NOTE — Progress Notes (Signed)
[facility] Counselor/Therapist Progress Note  Patient ID: Chase Hunter., MRN: [id number],    Date: 03/06/2022  Time Spent: 57 minutes   Treatment Type: Individual Therapy  Reported Symptoms: depression, anxiety "always anxious and heightened", hard to get out of bed, obsessiveness has been some better, most recent panic attack was yesterday and was afraid I was going to jump out the window. Wife helped him calm down with her presence.   Mental Status Exam:  Appearance:   Casual     Behavior:  Appropriate, Sharing, and Motivated  Motor:  Normal  Speech/Language:   Clear and Coherent  Affect:  Depressed and anxious  Mood:  anxious and depressed  Thought process:  Some tangentiality  Thought content:    Obsessions and "paranoid and I can't trust my perception"  Sensory/Perceptual disturbances:    WNL  Orientation:  oriented to person, place, time/date, situation, day of week, month of year, year, and stated date of March 06, 2022"  Attention:  Good  Concentration:  Good  Memory:  "Not good right now due to my anxiety/depression"  Fund of knowledge:   Good  Insight:    Good  Judgment:   Poor  Impulse Control:  Good and Fair   Risk Assessment: Danger to Self:   always in back of my mind but have never made an attempt Self-injurious Behavior: No Danger to Others: No Duty to Warn:no Physical Aggression / Violence:No  Access to Firearms a concern: No  Gang Involvement:No   Subjective:    Patient in today reporting increase in anxiety and depression, hard to get out of bed, he states obsessiveness is better but today is quite obsessive in session. Hurt and disappointed by incident at his former church. Processed this in session today and patient did well with this, expressing his thoughts more clearly but still some "disconnecting of thoughts" and ongoing anxiety.  Discussed his thoughts of SI and repeats that he would definitely go to behavioral health hospital it he  felt he was closer to acting out on SI.  Family remains very supportive and nearby.  Interventions: Cognitive Behavioral Therapy and Ego-Supportive  Treatment goals: Treatment goals remain on treatment plan as patient works with strategies to achieve his goals.  Progress is assessed each session and documented in the "subject" and/or "plan" sections of treatment note. Long-term goal: Reduce overall level, frequency, and intensity of the depression and anxiety so that daily functioning is not impaired. Short-term goal: Verbalize an understanding of the role that fearful thinking plays in creating fears, excessive worry, and persistent anxiety/depressive symptoms. Strategies: Identify, challenge, and replace fearful self-talk with positive, realistic, and empowering self-talk.   Diagnosis:   ICD-10-CM   1. Bipolar II disorder ([name])  F31.81      Plan: Patient today showing good participation and motivation in session as he focused on his bipolar, anxiety, depression, fears, extreme emotions, but also able to calm himself at times and venting does seem to help him today. Feeling some bit better about recent church situation and "I am better than I was years ago, but not doing as well these past few wks and church situation "was a benevolent bump in the road".  Adds that he does "tend to look for things to worry about" although trying to decrease that habit.  "Working to get beyond this and it just takes time." Again confirms that he/wife would go to behavioral health hospital if he felt he was going ot  act out on SI.  Seemed somewhat calmer by the end of session and agrees to remain on medication and practice some of the strategies we discussed today, while allowing his family especially to be supportive of him. Encouraged patient in his practice of positive behaviors including: Staying in touch with people who are supportive remaining in the present and focusing on what he can change versus cannot,  look for more positives versus negatives each day, stay on his prescribed medication, stay contacted socially in ways that feel comfortable to him, healthy nutrition and exercise, positive self talk, daily affirmations, allow his faith to be an emotional support as well as spiritual, and recognize the strength he shows working with goal directed behaviors to move in a direction that supports his improved emotional health and overall outlook.  Goal review and progress/challenges noted with patient.  Next appointment within 2 to 3 weeks.  This record has been created using [employer].  Chart creation errors have been sought, but may not always have been located and corrected.  Such creation errors do not reflect on the standard of medical care provided.   Shanon Ace, LCSW

## 2022-03-21 ENCOUNTER — Ambulatory Visit: Payer: Medicare PPO | Admitting: Psychiatry

## 2022-03-21 ENCOUNTER — Encounter: Payer: Self-pay | Admitting: Psychiatry

## 2022-03-21 VITALS — BP 146/95 | HR 78 | Wt 170.0 lb

## 2022-03-21 DIAGNOSIS — F3181 Bipolar II disorder: Secondary | ICD-10-CM | POA: Diagnosis not present

## 2022-03-21 DIAGNOSIS — F411 Generalized anxiety disorder: Secondary | ICD-10-CM

## 2022-03-21 MED ORDER — CLONIDINE HCL 0.1 MG PO TABS: 0.0500 mg | ORAL_TABLET | Freq: Every day | ORAL | 1 refills | Status: DC

## 2022-03-21 NOTE — Progress Notes (Signed)
Chase Hunter Chase Hunter [id number] May 28, 1961 61 y.o.  Subjective:   Patient ID:  Chase Hunter. is a 61 y.o. (DOB March 28, 1961) male.  Chief Complaint: No chief complaint on file.   HPI Chase Hunter. presents to the office today for follow-up of *** He reports an "over-riding baseline of anxiety." He reports some mood lability- "right now I am kind of depressed." He reports that he was "extremely anxious" a couple of weeks ago during a therapy visit. He reports that he has some chronic anxiety with acute exacerbations of anxiety at times. He describes feeling nervous and uncomfortable with anxiety. He reports that some mornings he notices anxiety immediately upon awakening. He reports worry has been ok and intrusive thoughts are "not bad." He reports that increased anxiety can occur anytime throughout the day. He reports that increases of anxiety occur  over the course of 2-3 weeks. He reports "these extremes in emotions are already there" and that when something happens these emotions manifest and he feels that he will "over-reacting" and then having "meta-cognition" about certain events. He reports that he is constantly thinking about his behavior, affect, and how he was perceived. He will replay conversations in his mind. He reports that mood has been fairly well controlled overall. He denies any irritability.   Sleeping well. Energy and motivation have been ok. He reports some difficulty with ST memory when he has periods of increased anxiety. Concentration is adequate. Denies anhedonia. Appetite has been decreased and has to make himself eat.   He reports that he has been having some suicidal thoughts. He contracts for safety and reports that both he and his wife have the information for [facility].   He has been doing some music. Has been helping daughter.   Past Psychiatric Medication Trials: Lamictal- Rash. Had seizure when it was stopped.  Depakote- Pancreatitis Lithium-  Family commented that he "seemed like a zombieSports administrator- Started about 6 weeks ago. Abilify- cannot recall response. Minimal improvement. Seroquel XR-Affective dulling, excessive somnolence. Took up to 600 mg QHS Seroquel Saphris- SI, uncontrolled crying Latuda Trazodone- Increased BP and suicidal thoughts Propranolol- Raised BP Sertraline- Worsening mood symptoms and anxiety Lexapro Wellbutrin Duloxetine-excessively talkative. "Saying things out loud that I thought I was just thinking."  Buspar-Increased anxiety and increased BP.  Klonopin Diazepam- Paradoxical effect    AIMS    Flowsheet Row Office Visit from 10/09/2021 in [place] Visit from 08/12/2021 in [place] Total Score 0 0      PHQ2-9    [facility] Office Visit from 08/16/2021 in [place] at [place] from 06/14/2021 in [place] at [facility] from 06/13/2020 in [facility] at [facility] Total Score 5 2 0  PHQ-9 Total Score 20 12 --      Flowsheet Row ED from 08/14/2021 in [place] from 06/14/2021 in [employer] at [place] No Risk Error: Q3, 4, or 5 should not be populated when Q2 is No        Review of Systems:  Review of Systems  Medications: I have reviewed the patient's current medications.  Current Outpatient Medications  Medication Sig Dispense Refill  . albuterol (PROVENTIL) (2.5 MG/3ML) 0.083% nebulizer solution Take 2.5 mg by nebulization every 6 (six) hours as needed for wheezing or shortness of breath.     Marland Kitchen albuterol (VENTOLIN HFA) 108 (90 Base) MCG/ACT inhaler  Inhale 2 puffs into the lungs every 6 (six) hours as needed for wheezing or shortness of breath. 18 g 1  . amLODipine (NORVASC) 5 MG tablet Take 0.5 tablets (2.5 mg total) by mouth at bedtime. 30 tablet 1  . Cholecalciferol (VITAMIN  D3) 125 MCG (5000 UT) TABS Take 5,000 Units by mouth daily.     . clonazePAM (KLONOPIN) 0.5 MG tablet Take 1/2-1 tablet BY MOUTH EVERY DAY AS NEEDED FOR anxiety AND panic. (Patient not taking: Reported on 01/31/2022) 30 tablet 2  . fluticasone (FLONASE) 50 MCG/ACT nasal spray Place into both nostrils daily as needed.    . fluticasone-salmeterol (ADVAIR) 250-50 MCG/ACT AEPB Inhale 1 puff into the lungs in the morning and at bedtime. (Patient not taking: Reported on 09/05/2021)    . INSULIN SYRINGE 1CC/29G (EXEL COMFORT POINT INSULIN SYR) 29G X 1/2" 1 ML MISC Inject testosterone 0.5 ml weekly 100 each 2  . L-Methylfolate-Algae (DEPLIN 15) 15-90.314 MG CAPS Take 15 mg by mouth daily. 90 capsule 3  . lisinopril (ZESTRIL) 40 MG tablet Take 1 tablet (40 mg total) by mouth daily. 90 tablet 2  . Omega-3 Fatty Acids (FISH OIL) 1200 MG CAPS Take 1,200 mg by mouth daily.     . QUEtiapine (SEROQUEL XR) 400 MG 24 hr tablet Take 1 tablet (400 mg total) by mouth every evening. 90 tablet 0  . testosterone cypionate (DEPOTESTOSTERONE CYPIONATE) 200 MG/ML injection Inject 0.5 mLs (100 mg total) into the muscle once a week. 2.5 mL 3  . Vitamins-Lipotropics (LIPOFLAVONOID PO) Take by mouth.     No current facility-administered medications for this visit.    Medication Side Effects: None  Allergies:  Allergies  Allergen Reactions  . Cephalexin Hives  . Depakote [Divalproex Sodium]     Caused Pancreatitis  . Trazodone And Nefazodone     Suicidal thoughts  . Lamictal [Lamotrigine] Rash    Had a seizure when he stopped it  . Lipitor [Atorvastatin] Rash    Other reaction(s): Other (See Comments) Memory issues that have not resolved    Past Medical History:  Diagnosis Date  . Allergy   . Anemia   . Asthma   . Bipolar II disorder ([place])   . Depression   . Gallstones   . GERD (gastroesophageal reflux disease)   . Hx of adenomatous colonic polyps   . Hyperlipidemia   . Hypertension   . Pancreatitis   .  Seizures ([place])    past hx 6 yrs ago x 1- coming off meds caused 1 seizure     Past Medical History, Surgical history, Social history, and Family history were reviewed and updated as appropriate.   Please see review of systems for further details on the patient's review from today.   Objective:   Physical Exam:  There were no vitals taken for this visit.  Physical Exam  Lab Review:     Component Value Date/Time   NA 136 08/14/2021 1415   K 3.7 08/14/2021 1415   CL 105 08/14/2021 1415   CO2 23 08/14/2021 1415   GLUCOSE 90 08/14/2021 1415   BUN 15 08/14/2021 1415   CREATININE 0.87 08/14/2021 1415   CREATININE 0.96 05/14/2020 0806   CALCIUM 9.1 08/14/2021 1415   PROT 7.2 08/14/2021 1415   ALBUMIN 4.1 08/14/2021 1415   AST 18 08/14/2021 1415   ALT 20 08/14/2021 1415   ALKPHOS 55 08/14/2021 1415   BILITOT 0.7 08/14/2021 1415   GFRNONAA >60 08/14/2021 1415  GFRNONAA 87 05/14/2020 0806   GFRAA 101 05/14/2020 0806       Component Value Date/Time   WBC 9.8 08/14/2021 1415   RBC 5.18 08/14/2021 1415   HGB 15.9 08/14/2021 1415   HCT 47.4 08/14/2021 1415   PLT 203 08/14/2021 1415   MCV 91.5 08/14/2021 1415   MCH 30.7 08/14/2021 1415   MCHC 33.5 08/14/2021 1415   RDW 12.0 08/14/2021 1415   LYMPHSABS 1.9 08/14/2021 1415   MONOABS 0.5 08/14/2021 1415   EOSABS 0.1 08/14/2021 1415   BASOSABS 0.1 08/14/2021 1415    No results found for: "POCLITH", "LITHIUM"   Lab Results  Component Value Date   VALPROATE 33.1 (L) 07/15/2019     .res Assessment: Plan:    There are no diagnoses linked to this encounter.   Please see After Visit Summary for patient specific instructions.  Future Appointments  Date Time Provider [place]  03/21/2022  9:00 AM Thayer Headings, PMHNP [facility] None  03/24/2022 10:00 AM Shanon Ace, LCSW [facility] None  04/03/2022  9:00 AM Shanon Ace, LCSW [facility] None    No orders of the defined types were placed in this  encounter.   -------------------------------

## 2022-03-24 ENCOUNTER — Ambulatory Visit (INDEPENDENT_AMBULATORY_CARE_PROVIDER_SITE_OTHER): Payer: Medicare PPO | Admitting: Psychiatry

## 2022-03-24 DIAGNOSIS — F3181 Bipolar II disorder: Secondary | ICD-10-CM | POA: Diagnosis not present

## 2022-03-24 NOTE — Progress Notes (Signed)
[facility] Counselor/Therapist Progress Note  Patient ID: Chase Morera., MRN: [id number],    Date: 03/24/2022  Time Spent: 50 minutes   Treatment Type: Individual Therapy  Reported Symptoms: some depression, anxiety but less than at last visit; feels that he's "not cycling so much and better understanding myself, tired today."  Mental Status Exam:  Appearance:   Casual     Behavior:  Sharing and Motivated  Motor:  Normal  Speech/Language:   Clear and Coherent  Affect:  Depressed  Mood:  anxious and depressed  Thought process:  States thoughts "cycle" between up and down  Thought content:    Rumination and "intrusive thoughts are much under control"  Sensory/Perceptual disturbances:    WNL  Orientation:  oriented to person, place, time/date, situation, day of week, month of year, year, and stated date of March 14, 2022  Attention:  Good  Concentration:  Good  Memory:  Memory "not so good" and "my short term memory is worse after cycling" with his bipolar  Fund of knowledge:   Good  Insight:    Good  Judgment:   Fair and can't always "judge my reactions and social cues"  Impulse Control:  Fair   Risk Assessment: Danger to Self:  No Self-injurious Behavior: No Danger to Others: No Duty to Warn:no Physical Aggression / Violence:No  Access to Firearms a concern: No  Gang Involvement:No   Subjective: Patient in today reporting some depression and my "anxiety is a baseline and today I'm better than last visit and I feel more in control." "Chasing bipolar symptoms is a waste of time". "Am understanding the cycling better." Patient wanting to process some of his fears for the future regarding "very disorganized thought patterns". Vented for a while and how he managed, including how he was able to help himself "let go". States it's good he processes like this "because as I hear it, it does help me better understand them." Adds that it helps him to "play my music daily on  his guitar, piano, cello, and others." Is getting out of bed more easily, not as obsessive in session. "Less disconnecting ot thoughts." Shared his "ongoing thoughts of SI but I keep them on the outside and never let them have control." States he feels he can continue to manage it this way and "I'd go to a hospital if needed because I'd rather be there, than dead. Family remains close and very supportive.   Interventions: Cognitive Behavioral Therapy  Treatment goals: Treatment goals remain on treatment plan as patient works with strategies to achieve his goals.  Progress is assessed each session and documented in the "subject" and/or "plan" sections of treatment note. Long-term goal: Reduce overall level, frequency, and intensity of the depression and anxiety so that daily functioning is not impaired. Short-term goal: Verbalize an understanding of the role that fearful thinking plays in creating fears, excessive worry, and persistent anxiety/depressive symptoms. Strategies: Identify, challenge, and replace fearful self-talk with positive, realistic, and empowering self-talk.    Diagnosis:   ICD-10-CM   1. Bipolar II disorder ([place])  F31.81      Plan: Patient today showing good motivation and participation in session, sharing an update as to his changes since last appointment and how he feels like he continues to "learn about my illness and not be chasing bipolar symptoms all the time".  States he has learned "that is a waste of time".  Does feel he is managing as well as  possible right now and states he is more on a "downward cycle" and still maintains that the "ongoing SI he is able to manage as he visualizes it being something on the outside of him".  Gives a strong commitment not to harm himself adding at one point in the session that "if it were that bad he would go to a hospital because he would rather be there than dead".  Ended up on a more positive note with patient reporting his insight to  his illness and able to recognize certain symptoms and understand why some things happen and typically any patterns that he notices.  Trying not to worry as much about "things to worry about" but this is a little harder when he has gone through significant mood changes recently.  Positive family support is especially important for him. Encouraged patient in his practice of positive behaviors including: Remaining in touch with people who are supportive, remaining in the present and focusing on what he can change versus cannot, look for more positives versus negatives daily, trying not to look for things to "worry about", remain on prescribed medication, staying in contact with others socially in ways that feel comfortable to him, healthy nutrition and exercise, positive self talk, and recognize the strength he shows working with goal directed behaviors to move in a direction that supports his improved emotional health.  Review and progress/challenges noted with patient.  Next appointment within 3 weeks.  This record has been created using [employer].  Chart creation errors have been sought, but may not always have been located and corrected.  Such creation errors do not reflect on the standard of medical care provided.   Shanon Ace, LCSW

## 2022-04-03 ENCOUNTER — Ambulatory Visit: Payer: Medicare PPO | Admitting: Psychiatry

## 2022-04-03 DIAGNOSIS — F3181 Bipolar II disorder: Secondary | ICD-10-CM | POA: Diagnosis not present

## 2022-04-03 NOTE — Progress Notes (Signed)
[facility] Counselor/Therapist Progress Note  Patient ID: Chase Hunter., MRN: [id number],    Date: 04/03/2022  Time Spent: 50 minutes   Treatment Type: Individual Therapy  Reported Symptoms: physical fatigue, anxiety, depression "all my bipolar", "but some improvement since last session"  Mental Status Exam:  Appearance:   Casual     Behavior:  Appropriate, Sharing, and Motivated  Motor:  Normal  Speech/Language:   Clear and Coherent  Affect:  Anxious, some depression  Mood:  anxious and depressed  Thought process:  goal directed  Thought content:    overthinking  Sensory/Perceptual disturbances:    WNL  Orientation:  oriented to person, place, time/date, situation, day of week, month of year, year, and stated date of April 03, 2022  Attention:  Good  Concentration:  Good  Memory:  WNLLater changes and said " I do have some short term memory issues especially when in one of my fast cycles."  Fund of knowledge:   Good  Insight:    Good  Judgment:   Fair  Impulse Control:  Good   Risk Assessment: Danger to Self:  No Self-injurious Behavior: No Danger to Others: No Duty to Warn:no Physical Aggression / Violence:No  Access to Firearms a concern: No  Gang Involvement:No   Subjective: Patient reporting today "I'm getting better but not done yet". Aware of "constant borderline anxiety even when I wake up." Does feel he is better than last session." Self-talk is often continuous, "I have to meta-cognate"  "It helps me deal with this fear/worry inside of me", but hard for him to explain . Concerned that sister and husband from [place], are coming to visit 2 nights this week, as that relates to some of his "dysfunctional past" Also discussed some issues related to younger brother employed at [employer] and how patient often feels less than. Much more open today in talking about further family history,mostly focused on how each person in family in which he grew up,  had their own psychological symptoms and challenges. "Don't chase my symptoms so much now as that doesn't help." Very appreciative of being heard and not judged. Still feels like he understands his illness better "which helps me deal with symptoms better." Getting out of bed ok now but still feel my anxiety.  Still has the thoughts of self harm "in the back of my head as usual, but they never get me to the point of actually deciding to harm myself."States he would go to hospital if he ever felt he was going harm himself.   Interventions: Cognitive Behavioral Therapy and Ego-Supportive   Treatment goals: Treatment goals remain on treatment plan as patient works with strategies to achieve his goals.  Progress is assessed each session and documented in the "subject" and/or "plan" sections of treatment note. Long-term goal: Reduce overall level, frequency, and intensity of the depression and anxiety so that daily functioning is not impaired. Short-term goal: Verbalize an understanding of the role that fearful thinking plays in creating fears, excessive worry, and persistent anxiety/depressive symptoms. Strategies: Identify, challenge, and replace fearful self-talk with positive, realistic, and empowering self-talk.   Diagnosis:   ICD-10-CM   1. Bipolar II disorder ([place])  F31.81      Plan: Patient today stating that he does feel he is improving but very gradually.  As noted above, he still is having some anxiety upon awakening and takes him a while to settle down some.  Discusses some about his tendency to "  Meta cognate" as he feels that helps him with his worrying but it is hard for him to explain.  Does not feel he is looking for something to worry about quite as much although that symptom has not gone away.  Discussed more of some family history that he wanted to share today in relation to what all is going on with himself, plus he is expecting a visit from his sister and her husband for the next  couple of days which he is looking forward to.  Still having some reported thoughts that are disconnected.  Feels good that he is getting out of bed more even if it takes a little bit of time but not like when he would just stay in bed.  Denies any active thoughts to harm himself and states that he lives with thoughts of self-harm being "in the back of my head as usual" and adds that "they never get me to the point of actually deciding to harm myself but if it did I would go to a hospital".  Enjoys his family and especially 7-monthold grandson son and looking forward to a future with him.  Again reported a lot of insight into his own illness which seems to help him in coping.  His family support remains constant and positive. Encouraged patient in his practice of positive behaviors including: Staying in the present focusing on what he can change versus cannot, recognize that anxious thoughts are just thoughts and work with strategies for decreasing them, staying in touch with people who are supportive, look for more positives versus negatives daily, trying not to "look for things to worry about", remain on prescribed medication, staying in contact with others that feel comfortable to him, healthy nutrition and exercise, positive self talk, and realize the strength he shows working with goal directed behaviors to move in a direction that supports his improved emotional health and overall outlook.  Goal review and progress/challenges noted with patient.  Next appointment within 2 to 3 weeks.  This record has been created using [employer]  Chart creation errors have been sought, but may not always have been located and corrected.  Such creation errors do not reflect on the standard of medical care provided.   DShanon Ace LCSW

## 2022-04-14 ENCOUNTER — Other Ambulatory Visit: Payer: Self-pay | Admitting: Psychiatry

## 2022-04-14 DIAGNOSIS — F410 Panic disorder [episodic paroxysmal anxiety] without agoraphobia: Secondary | ICD-10-CM

## 2022-04-18 ENCOUNTER — Ambulatory Visit (INDEPENDENT_AMBULATORY_CARE_PROVIDER_SITE_OTHER): Payer: Medicare PPO | Admitting: Psychiatry

## 2022-04-18 ENCOUNTER — Encounter: Payer: Self-pay | Admitting: Psychiatry

## 2022-04-18 DIAGNOSIS — F410 Panic disorder [episodic paroxysmal anxiety] without agoraphobia: Secondary | ICD-10-CM | POA: Diagnosis not present

## 2022-04-18 DIAGNOSIS — F411 Generalized anxiety disorder: Secondary | ICD-10-CM

## 2022-04-18 DIAGNOSIS — F3181 Bipolar II disorder: Secondary | ICD-10-CM

## 2022-04-18 MED ORDER — QUETIAPINE FUMARATE ER 200 MG PO TB24: 200.0000 mg | ORAL_TABLET | Freq: Every morning | ORAL | 0 refills | Status: DC

## 2022-04-18 NOTE — Progress Notes (Signed)
Chase Hunter [id number] 1960-12-17 61 y.o.  Subjective:   Patient ID:  Chase Hunter. is a 61 y.o. (DOB 14-Dec-1960) male.  Chief Complaint: No chief complaint on file.   HPI Chase Goth. presents to the office today for follow-up of ***  He reports 2 days ago, on Wednesday, was the worst day he has had in awhile with one of the worst panic attacks he has had in his life. He reports that he had suicidal thoughts on Wednesday with a plan of wanting to jump out a window. He reports that he then went downstairs and talked with his wife and told her, "if I continue to decline, I will need you to drive me to the hospital." He then told his wife about suicidal plan. He reports that his wife is concerned and has agreed to drive him to the hospital. He reports that "I slowly began to calm down." He reports that he has not had SI with plan since Wednesday. "I haven't been back in that mindset again and if I do, I want go back into that room." He reports chronic passive death wishes. Contracts for safety.   He reports that his wife offered him CBD OTC on Wednesday. He started this and stopped Clonidine. He reports that his wife has commented that she has seen an improvement in his mood and anxiety since Wednesday.   He reports that he will go in a dark room and get in the bed under the covers. He reports that he was in bed for 2 days. Last panic attack was Wednesday.   Denies depressed mood- "I'm having despair." He reports that he is fatigued from anxiety. He reports that motivation was low during period of 2 days. He reports otherwise his motivation is ok.   Panic attacks, "crying jags,"  He reports that Clonidine caused him to feel sleepy.   His sister came to visit and "it was an extremely toxic visit." He reports that he and his sister have had a "lifelong" negative relationship. He has decided "no more visits from them." "That's the reason I am in this state."   He  reports high levels of anxiety- "this is wearing me down." He reports high baseline anxiety. He reports difficulty putting his "thoughts in order because my mind is racing." Sleeping well with Seroquel XR. He reports that intrusive thoughts are improved compared to the past. Appetite has been decreased. Denies any impulsive or risky behaviors.   Has been using Klonopin prn about 4-5 times a week.   Past Psychiatric Medication Trials: Lamictal- Rash. Had seizure when it was stopped.  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 Abilify- cannot recall response. Minimal improvement. Seroquel XR-Affective dulling, excessive somnolence. Took up to 600 mg QHS Seroquel Saphris- SI, uncontrolled crying Latuda Trazodone- Increased BP and suicidal thoughts Propranolol- Raised BP Clonidine- increased panic, crying, and SI Sertraline- Worsening mood symptoms and anxiety Lexapro Wellbutrin Duloxetine-excessively talkative. "Saying things out loud that I thought I was just thinking."  Buspar-Increased anxiety and increased BP.  Klonopin Diazepam- Paradoxical effect    AIMS    Flowsheet Row Office Visit from 03/21/2022 in [place] Office Visit from 10/09/2021 in [place] Visit from 08/12/2021 in [place] Total Score 0 0 0      PHQ2-9    [place] Office Visit from 08/16/2021 in [facility] at  [facility] Clinical Support from 06/14/2021 in [place] at [facility] from 06/13/2020 in [place] at [facility] Total Score 5 2 0  PHQ-9 Total Score 20 12 --      Flowsheet Row ED from 08/14/2021 in [facility] from 06/14/2021 in [facility] [place] [facility] at [facility] No Risk Error: Q3, 4, or 5 should not be populated when Q2 is No         Review of Systems:  Review of Systems  Medications: {medication reviewed/display:3041432}  Current Outpatient Medications  Medication Sig Dispense Refill  . clonazePAM (KLONOPIN) 0.5 MG tablet Take 1/2-1 tablet BY MOUTH EVERY DAY AS NEEDED FOR anxiety AND panic. 30 tablet 2  . QUEtiapine (SEROQUEL XR) 200 MG 24 hr tablet Take 1 tablet (200 mg total) by mouth every morning. 30 tablet 0  . albuterol (PROVENTIL) (2.5 MG/3ML) 0.083% nebulizer solution Take 2.5 mg by nebulization every 6 (six) hours as needed for wheezing or shortness of breath.     Marland Kitchen albuterol (VENTOLIN HFA) 108 (90 Base) MCG/ACT inhaler Inhale 2 puffs into the lungs every 6 (six) hours as needed for wheezing or shortness of breath. 18 g 1  . amLODipine (NORVASC) 5 MG tablet Take 0.5 tablets (2.5 mg total) by mouth at bedtime. 30 tablet 1  . Cholecalciferol (VITAMIN D3) 125 MCG (5000 UT) TABS Take 5,000 Units by mouth daily.     . fluticasone (FLONASE) 50 MCG/ACT nasal spray Place into both nostrils daily as needed.    . fluticasone-salmeterol (ADVAIR) 250-50 MCG/ACT AEPB Inhale 1 puff into the lungs in the morning and at bedtime. (Patient not taking: Reported on 09/05/2021)    . INSULIN SYRINGE 1CC/29G (EXEL COMFORT POINT INSULIN SYR) 29G X 1/2" 1 ML MISC Inject testosterone 0.5 ml weekly 100 each 2  . L-Methylfolate-Algae (DEPLIN 15) 15-90.314 MG CAPS Take 15 mg by mouth daily. 90 capsule 3  . lisinopril (ZESTRIL) 40 MG tablet Take 1 tablet (40 mg total) by mouth daily. 90 tablet 2  . Omega-3 Fatty Acids (FISH OIL) 1200 MG CAPS Take 1,200 mg by mouth daily.     . QUEtiapine (SEROQUEL XR) 400 MG 24 hr tablet Take 1 tablet (400 mg total) by mouth every evening. 90 tablet 0  . testosterone cypionate (DEPOTESTOSTERONE CYPIONATE) 200 MG/ML injection Inject 0.5 mLs (100 mg total) into the muscle once a week. 2.5 mL 3  . Vitamins-Lipotropics (LIPOFLAVONOID PO) Take by mouth.     No current facility-administered medications for this  visit.    Medication Side Effects: {Medication Side Effects (Optional):21014029}  Allergies:  Allergies  Allergen Reactions  . Cephalexin Hives  . Depakote [Divalproex Sodium]     Caused Pancreatitis  . Trazodone And Nefazodone     Suicidal thoughts  . Lamictal [Lamotrigine] Rash    Had a seizure when he stopped it  . Lipitor [Atorvastatin] Rash    Other reaction(s): Other (See Comments) Memory issues that have not resolved    Past Medical History:  Diagnosis Date  . Allergy   . Anemia   . Asthma   . Bipolar II disorder ([place])   . Depression   . Gallstones   . GERD (gastroesophageal reflux disease)   . Hx of adenomatous colonic polyps   . Hyperlipidemia   . Hypertension   . Pancreatitis   . Seizures ([place])    past hx 6 yrs ago x 1- coming off meds caused 1  seizure     Past Medical History, Surgical history, Social history, and Family history were reviewed and updated as appropriate.   Please see review of systems for further details on the patient's review from today.   Objective:   Physical Exam:  There were no vitals taken for this visit.  Physical Exam  Lab Review:     Component Value Date/Time   NA 136 08/14/2021 1415   K 3.7 08/14/2021 1415   CL 105 08/14/2021 1415   CO2 23 08/14/2021 1415   GLUCOSE 90 08/14/2021 1415   BUN 15 08/14/2021 1415   CREATININE 0.87 08/14/2021 1415   CREATININE 0.96 05/14/2020 0806   CALCIUM 9.1 08/14/2021 1415   PROT 7.2 08/14/2021 1415   ALBUMIN 4.1 08/14/2021 1415   AST 18 08/14/2021 1415   ALT 20 08/14/2021 1415   ALKPHOS 55 08/14/2021 1415   BILITOT 0.7 08/14/2021 1415   GFRNONAA >60 08/14/2021 1415   GFRNONAA 87 05/14/2020 0806   GFRAA 101 05/14/2020 0806       Component Value Date/Time   WBC 9.8 08/14/2021 1415   RBC 5.18 08/14/2021 1415   HGB 15.9 08/14/2021 1415   HCT 47.4 08/14/2021 1415   PLT 203 08/14/2021 1415   MCV 91.5 08/14/2021 1415   MCH 30.7 08/14/2021 1415   MCHC 33.5 08/14/2021 1415    RDW 12.0 08/14/2021 1415   LYMPHSABS 1.9 08/14/2021 1415   MONOABS 0.5 08/14/2021 1415   EOSABS 0.1 08/14/2021 1415   BASOSABS 0.1 08/14/2021 1415    No results found for: "POCLITH", "LITHIUM"   Lab Results  Component Value Date   VALPROATE 33.1 (L) 07/15/2019     .res Assessment: Plan:    Diagnoses and all orders for this visit:  Bipolar II disorder ([place]) -     QUEtiapine (SEROQUEL XR) 200 MG 24 hr tablet; Take 1 tablet (200 mg total) by mouth every morning.     Please see After Visit Summary for patient specific instructions.  Future Appointments  Date Time Provider [place]  04/21/2022  1:00 PM Shanon Ace, LCSW [facility] None  05/12/2022 10:00 AM Shanon Ace, LCSW [facility] None    No orders of the defined types were placed in this encounter.   -------------------------------

## 2022-04-21 ENCOUNTER — Ambulatory Visit (INDEPENDENT_AMBULATORY_CARE_PROVIDER_SITE_OTHER): Payer: Medicare PPO | Admitting: Psychiatry

## 2022-04-21 DIAGNOSIS — F3181 Bipolar II disorder: Secondary | ICD-10-CM

## 2022-04-21 NOTE — Progress Notes (Signed)
[facility] Counselor/Therapist Progress Note  Patient ID: Chase Hunter., MRN: [id number],    Date: 04/21/2022  Time Spent: 50 minutes   Treatment Type: Individual Therapy  Reported Symptoms: anxiety  Mental Status Exam:  Appearance:   Casual     Behavior:  Appropriate, Sharing, and Motivated  Motor:  Normal  Speech/Language:   Clear and Coherent  Affect:  anxious  Mood:  anxious  Thought process:  "Logical and getting me through each day but can't trust my perceptions."  Thought content:    WNL and reports paranoid thoughts re: daughter and feeling she doesn't want him around too much.   Sensory/Perceptual disturbances:    WNL  Orientation:  oriented to person, place, time/date, situation, day of week, month of year, year, and stated date of April 21, 2022  Attention:  Good  Concentration:  Good  Memory:  Reports short term memory issues  Fund of knowledge:   Good  Insight:    Good and Fair  Judgment:   Good and Fair  Impulse Control:  Good   Risk Assessment: Danger to Self:  No Self-injurious Behavior: No Danger to Others: No Duty to Warn:no Physical Aggression / Violence:No  Access to Firearms a concern: No  Gang Involvement:No   Subjective:  Describes his recent episode of SI and how he reported it to his wife who was supportive and at home with patient. Sister's visit preceded this and patient reports he has now set firm limits with her and not choosing to have contact. Meds increased by med provider and patient reports it is helping his some, as it "lessens the range of dynamic emotions". Reports today his self-talk is better than last week and is more positive and empowering. "Right now I'm much better with very few intrusive thoughts." Still feeling constant pressure and some hyper-alertness. 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 Music is helpful but the creation of music can be "touchy especially if you try to create something and are not successful."   Interventions: Cognitive Behavioral Therapy, Ego-Supportive, and Insight-Oriented  Treatment goals: Treatment goals remain on treatment plan as patient works with strategies to achieve his goals.  Progress is assessed each session and documented in the "subject" and/or "plan" sections of treatment note. Long-term goal: Reduce overall level, frequency, and intensity of the depression and anxiety so that daily functioning is not impaired. Short-term goal: Verbalize an understanding of the role that fearful thinking plays in creating fears, excessive worry, and persistent anxiety/depressive symptoms. Strategies: Identify, challenge, and replace fearful self-talk with positive, realistic, and empowering self-talk.    Diagnosis:   ICD-10-CM   1. Bipolar II disorder ([place])  F31.81      Plan: Patient today showing good participation and motivation in session after having a more challenging week last week with a surge in symptoms.  A med change was made afterwards and that is helping patient at this point per his report.  Shares in session today as a result of tension last week, that he is set very clear boundaries with his sister and not choosing to have contact at this point.  Feels the med change is helping and that it lessens the range of emotions for him.  At session and today, he seems to be more grounded  and moving forward with his medication change and trying not to dwell as much on sister nor decisions made about that but instead, as he did in session without prompting actually shared some more positive thoughts looking in to the immediate future and some positives that he shared. Encouraged patient in practicing more positive behaviors including: Remaining in the present and focus on what  he can change versus cannot change, trying not to "look for things to worry about", work with strategies for decreasing anxious thoughts, staying in touch with people who are supportive, look more for positives versus negatives daily, stay on prescribed medication, staying in contact others that feel comfortable to him, healthy nutrition and exercise, more positive self talk, and recognize the strength he shows working with goal directed behaviors to move in a direction that supports his improved emotional health.  Goal review and progress/challenges noted with patient.  Next appt within 2 weeks.  This record has been created using [employer].  Chart creation errors have been sought, but may not always have been located and corrected.  Such creation errors do not reflect on the standard of medical care provided.   Shanon Ace, LCSW

## 2022-04-28 ENCOUNTER — Ambulatory Visit: Payer: Medicare PPO | Admitting: Psychiatry

## 2022-05-06 DIAGNOSIS — G479 Sleep disorder, unspecified: Secondary | ICD-10-CM | POA: Diagnosis not present

## 2022-05-07 ENCOUNTER — Encounter: Payer: Self-pay | Admitting: Psychiatry

## 2022-05-07 ENCOUNTER — Ambulatory Visit (INDEPENDENT_AMBULATORY_CARE_PROVIDER_SITE_OTHER): Payer: Medicare PPO | Admitting: Psychiatry

## 2022-05-07 VITALS — Wt 175.0 lb

## 2022-05-07 DIAGNOSIS — F411 Generalized anxiety disorder: Secondary | ICD-10-CM | POA: Diagnosis not present

## 2022-05-07 DIAGNOSIS — F3181 Bipolar II disorder: Secondary | ICD-10-CM

## 2022-05-07 MED ORDER — QUETIAPINE FUMARATE ER 400 MG PO TB24: 400.0000 mg | ORAL_TABLET | Freq: Every evening | ORAL | 0 refills | Status: DC

## 2022-05-07 MED ORDER — QUETIAPINE FUMARATE ER 200 MG PO TB24: 200.0000 mg | ORAL_TABLET | Freq: Every morning | ORAL | 0 refills | Status: DC

## 2022-05-07 NOTE — Progress Notes (Signed)
Alban Marucci Abie Cheek [id number] 1960/09/18 61 y.o.  Virtual Visit via Telephone Note  I connected with pt on 05/07/22 at 11:30 AM EDT by telephone and verified that I am speaking with the correct person using two identifiers.   I discussed the limitations, risks, security and privacy concerns of performing an evaluation and management service by telephone and the availability of in person appointments. I also discussed with the patient that there may be a patient responsible charge related to this service. The patient expressed understanding and agreed to proceed.   I discussed the assessment and treatment plan with the patient. The patient was provided an opportunity to ask questions and all were answered. The patient agreed with the plan and demonstrated an understanding of the instructions.   The patient was advised to call back or seek an in-person evaluation if the symptoms worsen or if the condition fails to improve as anticipated.  I provided 14 minutes of non-face-to-face time during this encounter.  The patient was located at home.  The provider was located at home.   Thayer Headings, PMHNP   Subjective:   Patient ID:  Chase Hunter. is a 61 y.o. (DOB [birth year]) male.  Chief Complaint:  Chief Complaint  Patient presents with   Follow-up    Anxiety    HPI Chase Hunter. presents for follow-up of anxiety and depression. He reports "I'm doing very well. I 'm pleased with the Seroquel change." He denies excessive grogginess or sedation and thinks that taking divided doses is helpful. He reports that taking Seroquel XR has helped with anxiety. He reports that anxiety has been more manageable. He reports that he experienced anxiety this morning while on a walk with his wife. He reports that anxious thoughts have been less, as well as intrusive thoughts. He reports that physical s/s of anxiety have improved. Denies any recent severe panic attacks. He reports that  his mood has been "very stable." He reports that he notices less extremes in emotions "I would much rather take this than what I had before." He denies any significant irritability or manic s/s. Denies anhedonia. Sleeping well at night. Estimates sleeping 9 hours a night. Appetite has been the same. Does not have much of an appetite "but I am eating." No change in weight. Energy is slightly lower and is continuing to do usually activities. Motivation has been good. Concentration has been adequate. He denies any suicidal thoughts.   He reports that Klonopin prn use has decreased.   Has been maintaining boundaries with his sister.   Past Psychiatric Medication Trials: Lamictal- Rash. Had seizure when it was stopped.  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 Abilify- cannot recall response. Minimal improvement. Seroquel XR-Affective dulling, excessive somnolence. Took up to 600 mg QHS Seroquel Saphris- SI, uncontrolled crying Latuda Trazodone- Increased BP and suicidal thoughts Propranolol- Raised BP Clonidine- increased panic, crying, and SI Sertraline- Worsening mood symptoms and anxiety Lexapro Wellbutrin Duloxetine-excessively talkative. "Saying things out loud that I thought I was just thinking."  Buspar-Increased anxiety and increased BP.  Klonopin Diazepam- Paradoxical effect    Review of Systems:  Review of Systems  Musculoskeletal:  Negative for gait problem.  Neurological:  Positive for tremors.       He reports that tremor is consistent with his baseline.   Psychiatric/Behavioral:         Please refer to HPI    Medications: I  have reviewed the patient's current medications.  Current Outpatient Medications  Medication Sig Dispense Refill   albuterol (PROVENTIL) (2.5 MG/3ML) 0.083% nebulizer solution Take 2.5 mg by nebulization every 6 (six) hours as needed for wheezing or shortness of breath.       albuterol (VENTOLIN HFA) 108 (90 Base) MCG/ACT inhaler Inhale 2 puffs into the lungs every 6 (six) hours as needed for wheezing or shortness of breath. 18 g 1   amLODipine (NORVASC) 5 MG tablet Take 0.5 tablets (2.5 mg total) by mouth at bedtime. 30 tablet 1   Cholecalciferol (VITAMIN D3) 125 MCG (5000 UT) TABS Take 5,000 Units by mouth daily.      clonazePAM (KLONOPIN) 0.5 MG tablet Take 1/2-1 tablet BY MOUTH EVERY DAY AS NEEDED FOR anxiety AND panic. 30 tablet 2   fluticasone (FLONASE) 50 MCG/ACT nasal spray Place into both nostrils daily as needed.     fluticasone-salmeterol (ADVAIR) 250-50 MCG/ACT AEPB Inhale 1 puff into the lungs in the morning and at bedtime. (Patient not taking: Reported on 09/05/2021)     INSULIN SYRINGE 1CC/29G (EXEL COMFORT POINT INSULIN SYR) 29G X 1/2" 1 ML MISC Inject testosterone 0.5 ml weekly 100 each 2   L-Methylfolate-Algae (DEPLIN 15) 15-90.314 MG CAPS Take 15 mg by mouth daily. 90 capsule 3   lisinopril (ZESTRIL) 40 MG tablet Take 1 tablet (40 mg total) by mouth daily. 90 tablet 2   Omega-3 Fatty Acids (FISH OIL) 1200 MG CAPS Take 1,200 mg by mouth daily.      QUEtiapine (SEROQUEL XR) 200 MG 24 hr tablet Take 1 tablet (200 mg total) by mouth every morning. 90 tablet 0   QUEtiapine (SEROQUEL XR) 400 MG 24 hr tablet Take 1 tablet (400 mg total) by mouth every evening. 90 tablet 0   testosterone cypionate (DEPOTESTOSTERONE CYPIONATE) 200 MG/ML injection Inject 0.5 mLs (100 mg total) into the muscle once a week. 2.5 mL 3   Vitamins-Lipotropics (LIPOFLAVONOID PO) Take by mouth.     No current facility-administered medications for this visit.    Medication Side Effects: Other: Mild affective dulling and less energy  Allergies:  Allergies  Allergen Reactions   Cephalexin Hives   Depakote [Divalproex Sodium]     Caused Pancreatitis   Trazodone And Nefazodone     Suicidal thoughts   Lamictal [Lamotrigine] Rash    Had a seizure when he stopped it   Lipitor  [Atorvastatin] Rash    Other reaction(s): Other (See Comments) Memory issues that have not resolved    Past Medical History:  Diagnosis Date   Allergy    Anemia    Asthma    Bipolar II disorder ([place])    Depression    Gallstones    GERD (gastroesophageal reflux disease)    Hx of adenomatous colonic polyps    Hyperlipidemia    Hypertension    Pancreatitis    Seizures ([facility])    past hx 6 yrs ago x 1- coming off meds caused 1 seizure     Family History  Problem Relation Age of Onset   Atrial fibrillation Mother    Anxiety disorder Mother    Obesity Mother    Mood Disorder Mother    Anxiety disorder Father    Prostate cancer Father    Brain cancer Father    Anxiety disorder Brother    Anxiety disorder Daughter    Colon cancer Neg Hx    Colon polyps Neg Hx    Esophageal cancer Neg Hx  Rectal cancer Neg Hx    Stomach cancer Neg Hx     Social History   Socioeconomic History   Marital status: Married    Spouse name: Not on file   Number of children: 1   Years of education: Not on file   Highest education level: Not on file  Occupational History   Occupation: retired Pharmacist, hospital  Tobacco Use   Smoking status: Never   Smokeless tobacco: Never  Substance and Sexual Activity   Alcohol use: Never   Drug use: Not Currently   Sexual activity: Not on file  Other Topics Concern   Not on file  Social History Narrative   Not on file   Social Determinants of Health   Financial Resource Strain: Low Risk  (06/14/2021)   Overall Financial Resource Strain (CARDIA)    Difficulty of Paying Living Expenses: Not hard at all  Food Insecurity: No Food Insecurity (06/14/2021)   Hunger Vital Sign    Worried About Running Out of Food in the Last Year: Never true    Ran Out of Food in the Last Year: Never true  Transportation Needs: No Transportation Needs (06/14/2021)   PRAPARE - Hydrologist (Medical): No    Lack of Transportation (Non-Medical): No   Physical Activity: Insufficiently Active (06/14/2021)   Exercise Vital Sign    Days of Exercise per Week: 3 days    Minutes of Exercise per Session: 40 min  Stress: Stress Concern Present (06/14/2021)   [place]    Feeling of Stress : Very much  Social Connections: Moderately Integrated (06/14/2021)   Social Connection and Isolation Panel [NHANES]    Frequency of Communication with Friends and Family: Three times a week    Frequency of Social Gatherings with Friends and Family: Three times a week    Attends Religious Services: Never    Active Member of Clubs or Organizations: Yes    Attends Archivist Meetings: More than 4 times per year    Marital Status: Married  Human resources officer Violence: Not At Risk (06/14/2021)   Humiliation, Afraid, Rape, and Kick questionnaire    Fear of Current or Ex-Partner: No    Emotionally Abused: No    Physically Abused: No    Sexually Abused: No    Past Medical History, Surgical history, Social history, and Family history were reviewed and updated as appropriate.   Please see review of systems for further details on the patient's review from today.   Objective:   Physical Exam:  Wt 175 lb (79.4 kg)   BMI 25.84 kg/m   Physical Exam Neurological:     Mental Status: He is alert and oriented to person, place, and time.     Cranial Nerves: No dysarthria.  Psychiatric:        Attention and Perception: Attention and perception normal.        Mood and Affect: Mood normal.        Speech: Speech normal.        Behavior: Behavior is cooperative.        Thought Content: Thought content normal. Thought content is not paranoid or delusional. Thought content does not include homicidal or suicidal ideation. Thought content does not include homicidal or suicidal plan.        Cognition and Memory: Cognition and memory normal.        Judgment: Judgment normal.     Comments:  Insight  intact     Lab Review:     Component Value Date/Time   NA 136 08/14/2021 1415   K 3.7 08/14/2021 1415   CL 105 08/14/2021 1415   CO2 23 08/14/2021 1415   GLUCOSE 90 08/14/2021 1415   BUN 15 08/14/2021 1415   CREATININE 0.87 08/14/2021 1415   CREATININE 0.96 05/14/2020 0806   CALCIUM 9.1 08/14/2021 1415   PROT 7.2 08/14/2021 1415   ALBUMIN 4.1 08/14/2021 1415   AST 18 08/14/2021 1415   ALT 20 08/14/2021 1415   ALKPHOS 55 08/14/2021 1415   BILITOT 0.7 08/14/2021 1415   GFRNONAA >60 08/14/2021 1415   GFRNONAA 87 05/14/2020 0806   GFRAA 101 05/14/2020 0806       Component Value Date/Time   WBC 9.8 08/14/2021 1415   RBC 5.18 08/14/2021 1415   HGB 15.9 08/14/2021 1415   HCT 47.4 08/14/2021 1415   PLT 203 08/14/2021 1415   MCV 91.5 08/14/2021 1415   MCH 30.7 08/14/2021 1415   MCHC 33.5 08/14/2021 1415   RDW 12.0 08/14/2021 1415   LYMPHSABS 1.9 08/14/2021 1415   MONOABS 0.5 08/14/2021 1415   EOSABS 0.1 08/14/2021 1415   BASOSABS 0.1 08/14/2021 1415    No results found for: "POCLITH", "LITHIUM"   Lab Results  Component Value Date   VALPROATE 33.1 (L) 07/15/2019     .res Assessment: Plan:   Pt reports that his anxiety has significantly improved with the addition of Seroquel XR 200 mg in the morning and continuing Seroquel XR 400 mg in the evening. He reports that he is tolerating this dose and not experiencing excessive daytime somnolence like he experienced in the past with taking 600 mg in the evening. He reports some mild affective dulling and slight decrease in energy in the morning and that benefits are currently far outweighing these side effects. Will therefore continue current medications without changes.  Will continue Klonopin prn anxiety.  Recommend continuing therapy with Rinaldo Cloud, LCSW.  Pt to follow-up with this provider in 4 weeks or sooner if clinically indicated.  Patient advised to contact office with any questions, adverse effects, or acute  worsening in signs and symptoms.  Chase Hunter was seen today for follow-up.  Diagnoses and all orders for this visit:  Bipolar II disorder ([place]) -     QUEtiapine (SEROQUEL XR) 200 MG 24 hr tablet; Take 1 tablet (200 mg total) by mouth every morning. -     QUEtiapine (SEROQUEL XR) 400 MG 24 hr tablet; Take 1 tablet (400 mg total) by mouth every evening.  Generalized anxiety disorder    Please see After Visit Summary for patient specific instructions.  Future Appointments  Date Time Provider [place]  05/12/2022 10:00 AM Shanon Ace, LCSW [facility] None  05/14/2022  8:00 AM Shanon Ace, LCSW [facility] None    No orders of the defined types were placed in this encounter.     -------------------------------

## 2022-05-12 ENCOUNTER — Ambulatory Visit (INDEPENDENT_AMBULATORY_CARE_PROVIDER_SITE_OTHER): Payer: Medicare PPO | Admitting: Psychiatry

## 2022-05-12 DIAGNOSIS — F3181 Bipolar II disorder: Secondary | ICD-10-CM | POA: Diagnosis not present

## 2022-05-12 NOTE — Progress Notes (Signed)
   Established Patient Office Visit  Subjective   Patient ID: Chase Hunter., male    DOB: 1961/08/24  Age: 61 y.o. MRN: [id number]  No chief complaint on file.   HPI    ROS    Objective:     There were no vitals taken for this visit.   Physical Exam   No results found for any visits on 05/12/22.    The 10-year ASCVD risk score (Arnett DK, et al., 2019) is: 18.3%    Assessment & Plan:   Problem List Items Addressed This Visit   None   No follow-ups on file.    Shanon Ace, LCSW

## 2022-05-12 NOTE — Progress Notes (Signed)
[facility] Counselor/Therapist Progress Note  Patient ID: Chase Hunter., MRN: [id number],    Date: 05/12/2022  Time Spent: 55 minutes   Treatment Type: Individual Therapy  Reported Symptoms: anxiety, depression (decreased some more recently)  Mental Status Exam:  Appearance:   Casual     Behavior:  Appropriate, Sharing, and Motivated  Motor:  Normal  Speech/Language:   Clear and Coherent  Affect:  Depressed and anxious  Mood:  anxious and depressed  Thought process:  goal directed  Thought content:    Obsessions  Sensory/Perceptual disturbances:    WNL  Orientation:  oriented to person, place, time/date, situation, day of week, month of year, year, and stated date of Sept. 11, 2023  Attention:  Good  Concentration:  Good  Memory:  Reports some "forgetting issues with my short term memory"  Fund of knowledge:   Good  Insight:    Good and Fair  Judgment:   Fair-per patient  Impulse Control:  Good   Risk Assessment: Danger to Self:  No Self-injurious Behavior: No Danger to Others: No Duty to Warn:no Physical Aggression / Violence:No  Access to Firearms a concern: No  Gang Involvement:No   Subjective:  Patient in today reporting some decrease in his anxiety and depression, after recent episodes of his bipolar. States being on current level of [place] and feeling "more stable". No significant spikes in his mood and feels he is doing ok for right now. Trying not to micro-manage and plan too far ahead, taking care of self, paying more attention to others. Discussed his own self talk and examples of good/negative. Sates "Good self-talk is something I need to be working on more" which he did in session today. Self confidence better and also recognizing his need to make good choices as we mentioned several examples. Feels meds help him from having the broader range of emotions , and fewer intrusive thoughts, and less hyper-alertness.  Interventions: Cognitive  Behavioral Therapy and Ego-Supportive   Treatment goals: Treatment goals remain on treatment plan as patient works with strategies to achieve his goals.  Progress is assessed each session and documented in the "subject" and/or "plan" sections of treatment note. Long-term goal: Reduce overall level, frequency, and intensity of the depression and anxiety so that daily functioning is not impaired. Short-term goal: Verbalize an understanding of the role that fearful thinking plays in creating fears, excessive worry, and persistent anxiety/depressive symptoms. Strategies: Identify, challenge, and replace fearful self-talk with positive, realistic, and empowering self-talk.   Diagnosis:   ICD-10-CM   1. Bipolar II disorder ([place])  F31.81      Plan: Patient today showing good motivation and participation in session as he reports being back on Seroquel which is really helping him manage his symptomology with his bipolar disorder.  Progressing in symptom reduction and has a better overall feeling about himself today.  Taking better care of himself, and paying more attention to others, while trying not to micro-manage things nor make assumptions way in advance.  More positive self talk and feels that he is making better decisions.  Continues to keep clear boundaries with sister.  Continues to feel more grounded.  Does not seem to get stuck on certain issues as he was experiencing prior to this most recent episode. Encouraged patient in his practice of more positive behaviors including: Staying in the present and focusing on what he can change versus cannot, trying not to "look for things to worry about", work with  strategies for decreasing anxious thoughts, staying in touch with people who are supportive, look for more positives versus negatives each day, remain on his prescribed medication, staying in contact with others that feel comfortable to him, healthy nutrition and exercise, more positive self talk, and  realize the strength he shows working with goal directed behaviors to move in a direction that supports his improved emotional health and overall wellbeing.  Goal review and progress/challenges noted with patient.  Next appointment within 2 to 3 weeks.  This record has been created using [employer].  Chart creation errors have been sought, but may not always have been located and corrected.  Such creation errors do not reflect on the standard of medical care provided.   Shanon Ace, LCSW

## 2022-05-14 ENCOUNTER — Ambulatory Visit: Payer: Medicare PPO | Admitting: Psychiatry

## 2022-05-27 ENCOUNTER — Ambulatory Visit: Payer: Medicare PPO | Admitting: Psychiatry

## 2022-06-02 ENCOUNTER — Telehealth: Payer: Self-pay | Admitting: Psychiatry

## 2022-06-02 ENCOUNTER — Ambulatory Visit (INDEPENDENT_AMBULATORY_CARE_PROVIDER_SITE_OTHER): Payer: Medicare PPO | Admitting: Psychiatry

## 2022-06-02 DIAGNOSIS — F3181 Bipolar II disorder: Secondary | ICD-10-CM | POA: Diagnosis not present

## 2022-06-02 DIAGNOSIS — R000 Tachycardia, unspecified: Secondary | ICD-10-CM | POA: Diagnosis not present

## 2022-06-02 NOTE — Progress Notes (Signed)
[facility] Counselor/Therapist Progress Note  Patient ID: Chase Kempner., MRN: [id number],    Date: 06/02/2022  Time Spent: 55 minutes   Treatment Type: Individual Therapy  Reported Symptoms: anxiety, dealing with "higher heart rate" with his PCP, "not sure if I'm anxious and that's affecting my heart rate but I've told my doctor" , "no depression"  Mental Status Exam:  Appearance:   Casual     Behavior:  Appropriate, Sharing, and Motivated  Motor:  Normal  Speech/Language:   Clear and Coherent  Affect:  anxious  Mood:  anxious  Thought process:  goal directed  Thought content:    WNL  Sensory/Perceptual disturbances:    WNL  Orientation:  oriented to person, place, time/date, situation, day of week, month of year, year, and stated date of Oct. 2, 2023  Attention:  Good  Concentration:  Good  Memory:  WNL  Fund of knowledge:   Good  Insight:    Good  Judgment:   Good  Impulse Control:  Good   Risk Assessment: Danger to Self:  No Self-injurious Behavior: No Danger to Others: No Duty to Warn:no Physical Aggression / Violence:No  Access to Firearms a concern: No  Gang Involvement:No   Subjective:  Patient and for appointment today and reporting increased anxiety related to an episode with his daughter "that I had to help her through", a health concern (increased heart-rate)and wondering if it could be med-related) discussed today with his med provider here at our practice which helped put plan in place for him to followup with his PCP and get EKG done if symptoms continue.).  Dreading upcoming trip involving  8 people (non-family) and "wished I didn't have to go but am working on being more positive about it." Very aware of his tendency to "look for things to worry about" and discussed this more today. Feels he is making progress gradually as I am able to sometimes "not fixate on things" which he feels is significant. Continues to try not to micro-manage  everything and learning when to back off. More positive self-talk is something he is still working on "rather than depend on others to provide positive messages" to patient.  Worked with some specific examples of this and patient responded well.  Interventions: Cognitive Behavioral Therapy and Ego-Supportive  Treatment goals: Treatment goals remain on treatment plan as patient works with strategies to achieve his goals.  Progress is assessed each session and documented in the "subject" and/or "plan" sections of treatment note. Long-term goal: Reduce overall level, frequency, and intensity of the depression and anxiety so that daily functioning is not impaired. Short-term goal: Verbalize an understanding of the role that fearful thinking plays in creating fears, excessive worry, and persistent anxiety/depressive symptoms. Strategies: Identify, challenge, and replace fearful self-talk with positive, realistic, and empowering self-talk.   Diagnosis:   ICD-10-CM   1. Bipolar II disorder ([place])  F31.81      Plan:  Patient today showing motivation and active participation in session.  He did report a higher heart rate that he is experiencing and we were able to talk with his med provider, Thayer Headings here in office today.  Patient shared with her that his plan was to monitor it and if it did not stop soon he planned to go to the [facility] nearby and get an EKG done which med provider supported and asked patient that they send a copy results to her.  Did well today as  noted above, in discussing some upcoming family situations that he has been dreading but was able today to talk through the situations and look at how he could better manage some of them and also have some "outlets" to where he can take time away from people as needed as part of his own self-care.  Seem to feel more empowered upon leaving and also and also liked the idea of him giving more positive messages to himself rather than  depending on others.  Reports taking better care of himself and continued work on trying not to [employer] and make assumptions about himself or how things might go at a particular event, way ahead of time.  Reports continued clear boundaries with sister. Encouraged patient and practicing more of the positive behaviors as noted in session including: Remaining in the present and focusing on what he can change versus cannot, work with strategies for decreasing anxious thoughts, staying in touch with people who are supportive, trying not to "look for things to worry about", look more for positives versus negatives daily, stay on his prescribed medication, remaining contact with others that feel comfortable to him, healthy nutrition and exercise, more positive self talk, and recognize the strength he shows when working with goal directed behaviors to move in a direction that supports his improved emotional health and outlook.  Goal review and progress/challenges noted with patient.  Next appointment within 2 to 3 weeks.  This record has been created using [employer].  Chart creation errors have been sought, but may not always have been located and corrected.  Such creation errors do not reflect on the standard of medical care provided.   Shanon Ace, LCSW

## 2022-06-02 NOTE — Telephone Encounter (Signed)
Patient in to see therapist today and mentioned to therapist that his HR has been elevated and recalled cardiologist mentioning in the that Seroquel could cause QT prolongation. Talked with patient briefly while he was in office to see therapist. He reports that recently he has had elevated HR and that his resting HR has been in the upper 130's at times and he is aware of his heart beating quickly. He reports that his BP is fluctuating between being high and low. He reports that he is also experiencing chest tightness. He reports that he just started to notice these s/s in the last couple of weeks and had previously been tolerating increase in Seroquel XR to 600 mg since 04/18/22. He reports that he has messaged his PCP and is awaiting response. Advised pt to go to urgent care after leaving apt to have an EKG and rule out acute cardiac condition. Requested that pt ask for EKG results to be faxed to this provider. Discussed Seroquel could be reduced or changed if medical providers do not find other cause for recent symptoms. Requested pt contact provider after seeing urgent care.

## 2022-06-05 DIAGNOSIS — I10 Essential (primary) hypertension: Secondary | ICD-10-CM | POA: Diagnosis not present

## 2022-06-05 DIAGNOSIS — F3181 Bipolar II disorder: Secondary | ICD-10-CM | POA: Diagnosis not present

## 2022-06-05 DIAGNOSIS — R002 Palpitations: Secondary | ICD-10-CM | POA: Diagnosis not present

## 2022-06-05 DIAGNOSIS — F419 Anxiety disorder, unspecified: Secondary | ICD-10-CM | POA: Diagnosis not present

## 2022-06-09 ENCOUNTER — Ambulatory Visit (INDEPENDENT_AMBULATORY_CARE_PROVIDER_SITE_OTHER): Payer: Medicare PPO | Admitting: Psychiatry

## 2022-06-09 ENCOUNTER — Encounter: Payer: Self-pay | Admitting: Psychiatry

## 2022-06-09 DIAGNOSIS — F411 Generalized anxiety disorder: Secondary | ICD-10-CM | POA: Diagnosis not present

## 2022-06-09 DIAGNOSIS — F3181 Bipolar II disorder: Secondary | ICD-10-CM | POA: Diagnosis not present

## 2022-06-09 NOTE — Progress Notes (Signed)
Chase Hunter [name] [id number] March 19, 1961 61 y.o.  Virtual Visit via Telephone Note  I connected with pt on 06/09/22 at  8:30 AM EDT by telephone and verified that I am speaking with the correct person using two identifiers.   I discussed the limitations, risks, security and privacy concerns of performing an evaluation and management service by telephone and the availability of in person appointments. I also discussed with the patient that there may be a patient responsible charge related to this service. The patient expressed understanding and agreed to proceed.   I discussed the assessment and treatment plan with the patient. The patient was provided an opportunity to ask questions and all were answered. The patient agreed with the plan and demonstrated an understanding of the instructions.   The patient was advised to call back or seek an in-person evaluation if the symptoms worsen or if the condition fails to improve as anticipated.  I provided 15 minutes of non-face-to-face time during this encounter.  The patient was located at home.  The provider was located at [place].   Thayer Headings, PMHNP   Subjective:   Patient ID:  Chase Hunter. is a 61 y.o. (DOB 10/28/1960) male.  Chief Complaint:  Chief Complaint  Patient presents with   Follow-up    Anxiety and Mood disturbance    HPI Chase Hunter. presents for follow-up of anxiety and Bipolar Disorder. He reports, "I'm feeling fine." He reports that he went to urgent care and then his PCP. He reports that he did not have increased HR at urgent care or at PCP. He reports that his EKG was normal. He has been referred to cardiology and will have a Holter monitor. He reports that that day after medical apt he had a period of increased HR. He reports that he has not been experiencing anxiety when he has been having episodes of increased HR. He reports that his daughter is having similar cardiac  symptoms and has been told she is having Premature Atrial Contractions.   "Mentally I have been feeling fine." He reports that he has some tiredness in the middle of the day "but I would rather be tired than dealing with intrusive thoughts. He denies any manic s/s. Denies depressed mood. He reports that he has had some occasional panic s/s. He reports that anxiety has been better overall. Sleeping well and averaging 8-9 hours a night. He reports occasional naps. Energy is ok. Motivation has been ok. Maintaining current weight. Appetite is fair and has been eating. He has been working on music and has been helping with grandson. Going to travel some in the near future. Concentration has been good. Denies tremor. Denies SI.    Past Psychiatric Medication Trials: Lamictal- Rash. Had seizure when it was stopped.  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 Abilify- cannot recall response. Minimal improvement. Seroquel XR-Affective dulling, excessive somnolence. Took up to 600 mg QHS Seroquel Saphris- SI, uncontrolled crying Latuda Trazodone- Increased BP and suicidal thoughts Propranolol- Raised BP Clonidine- increased panic, crying, and SI Sertraline- Worsening mood symptoms and anxiety Lexapro Wellbutrin Duloxetine-excessively talkative. "Saying things out loud that I thought I was just thinking."  Buspar-Increased anxiety and increased BP.  Klonopin Diazepam- Paradoxical effect  Review of Systems:  Review of Systems  Cardiovascular:  Positive for palpitations.  Musculoskeletal:  Negative for gait problem.  Neurological:  Negative for tremors.  Psychiatric/Behavioral:  Please refer to HPI    Medications: I have reviewed the patient's current medications.  Current Outpatient Medications  Medication Sig Dispense Refill   albuterol (PROVENTIL) (2.5 MG/3ML) 0.083% nebulizer solution Take 2.5 mg by  nebulization every 6 (six) hours as needed for wheezing or shortness of breath.      albuterol (VENTOLIN HFA) 108 (90 Base) MCG/ACT inhaler Inhale 2 puffs into the lungs every 6 (six) hours as needed for wheezing or shortness of breath. 18 g 1   amLODipine (NORVASC) 5 MG tablet Take 0.5 tablets (2.5 mg total) by mouth at bedtime. 30 tablet 1   Cholecalciferol (VITAMIN D3) 125 MCG (5000 UT) TABS Take 5,000 Units by mouth daily.      clonazePAM (KLONOPIN) 0.5 MG tablet Take 1/2-1 tablet BY MOUTH EVERY DAY AS NEEDED FOR anxiety AND panic. 30 tablet 2   fluticasone (FLONASE) 50 MCG/ACT nasal spray Place into both nostrils daily as needed.     fluticasone-salmeterol (ADVAIR) 250-50 MCG/ACT AEPB Inhale 1 puff into the lungs in the morning and at bedtime. (Patient not taking: Reported on 09/05/2021)     INSULIN SYRINGE 1CC/29G (EXEL COMFORT POINT INSULIN SYR) 29G X 1/2" 1 ML MISC Inject testosterone 0.5 ml weekly 100 each 2   L-Methylfolate-Algae (DEPLIN 15) 15-90.314 MG CAPS Take 15 mg by mouth daily. 90 capsule 3   lisinopril (ZESTRIL) 40 MG tablet Take 1 tablet (40 mg total) by mouth daily. 90 tablet 2   Omega-3 Fatty Acids (FISH OIL) 1200 MG CAPS Take 1,200 mg by mouth daily.      QUEtiapine (SEROQUEL XR) 200 MG 24 hr tablet Take 1 tablet (200 mg total) by mouth every morning. 90 tablet 0   QUEtiapine (SEROQUEL XR) 400 MG 24 hr tablet Take 1 tablet (400 mg total) by mouth every evening. 90 tablet 0   testosterone cypionate (DEPOTESTOSTERONE CYPIONATE) 200 MG/ML injection Inject 0.5 mLs (100 mg total) into the muscle once a week. 2.5 mL 3   Vitamins-Lipotropics (LIPOFLAVONOID PO) Take by mouth.     No current facility-administered medications for this visit.    Medication Side Effects: Other: Some mid-day somnolence  Allergies:  Allergies  Allergen Reactions   Cephalexin Hives   Depakote [Divalproex Sodium]     Caused Pancreatitis   Trazodone And Nefazodone     Suicidal thoughts   Lamictal  [Lamotrigine] Rash    Had a seizure when he stopped it   Lipitor [Atorvastatin] Rash    Other reaction(s): Other (See Comments) Memory issues that have not resolved    Past Medical History:  Diagnosis Date   Allergy    Anemia    Asthma    Bipolar II disorder ([place])    Depression    Gallstones    GERD (gastroesophageal reflux disease)    Hx of adenomatous colonic polyps    Hyperlipidemia    Hypertension    Pancreatitis    Seizures ([facility])    past hx 6 yrs ago x 1- coming off meds caused 1 seizure     Family History  Problem Relation Age of Onset   Atrial fibrillation Mother    Anxiety disorder Mother    Obesity Mother    Mood Disorder Mother    Anxiety disorder Father    Prostate cancer Father    Brain cancer Father    Anxiety disorder Brother    Anxiety disorder Daughter    Colon cancer Neg Hx    Colon polyps Neg Hx  Esophageal cancer Neg Hx    Rectal cancer Neg Hx    Stomach cancer Neg Hx     Social History   Socioeconomic History   Marital status: Married    Spouse name: Not on file   Number of children: 1   Years of education: Not on file   Highest education level: Not on file  Occupational History   Occupation: retired Pharmacist, hospital  Tobacco Use   Smoking status: Never   Smokeless tobacco: Never  Substance and Sexual Activity   Alcohol use: Never   Drug use: Not Currently   Sexual activity: Not on file  Other Topics Concern   Not on file  Social History Narrative   Not on file   Social Determinants of Health   Financial Resource Strain: Low Risk  (06/14/2021)   Overall Financial Resource Strain (CARDIA)    Difficulty of Paying Living Expenses: Not hard at all  Food Insecurity: No Food Insecurity (06/14/2021)   Hunger Vital Sign    Worried About Running Out of Food in the Last Year: Never true    Ran Out of Food in the Last Year: Never true  Transportation Needs: No Transportation Needs (06/14/2021)   PRAPARE - Radiographer, therapeutic (Medical): No    Lack of Transportation (Non-Medical): No  Physical Activity: Insufficiently Active (06/14/2021)   Exercise Vital Sign    Days of Exercise per Week: 3 days    Minutes of Exercise per Session: 40 min  Stress: Stress Concern Present (06/14/2021)   [place]    Feeling of Stress : Very much  Social Connections: Moderately Integrated (06/14/2021)   Social Connection and Isolation Panel [NHANES]    Frequency of Communication with Friends and Family: Three times a week    Frequency of Social Gatherings with Friends and Family: Three times a week    Attends Religious Services: Never    Active Member of Clubs or Organizations: Yes    Attends Archivist Meetings: More than 4 times per year    Marital Status: Married  Human resources officer Violence: Not At Risk (06/14/2021)   Humiliation, Afraid, Rape, and Kick questionnaire    Fear of Current or Ex-Partner: No    Emotionally Abused: No    Physically Abused: No    Sexually Abused: No    Past Medical History, Surgical history, Social history, and Family history were reviewed and updated as appropriate.   Please see review of systems for further details on the patient's review from today.   Objective:   Physical Exam:  There were no vitals taken for this visit.  Physical Exam Constitutional:      General: He is not in acute distress. Musculoskeletal:        General: No deformity.  Neurological:     Mental Status: He is alert and oriented to person, place, and time.     Coordination: Coordination normal.  Psychiatric:        Attention and Perception: Attention and perception normal. He does not perceive auditory or visual hallucinations.        Mood and Affect: Mood normal. Mood is not anxious or depressed. Affect is not labile, blunt, angry or inappropriate.        Speech: Speech normal.        Behavior: Behavior normal.         Thought Content: Thought content normal. Thought content is not paranoid or  delusional. Thought content does not include homicidal or suicidal ideation. Thought content does not include homicidal or suicidal plan.        Cognition and Memory: Cognition and memory normal.        Judgment: Judgment normal.     Comments: Insight intact     Lab Review:     Component Value Date/Time   NA 136 08/14/2021 1415   K 3.7 08/14/2021 1415   CL 105 08/14/2021 1415   CO2 23 08/14/2021 1415   GLUCOSE 90 08/14/2021 1415   BUN 15 08/14/2021 1415   CREATININE 0.87 08/14/2021 1415   CREATININE 0.96 05/14/2020 0806   CALCIUM 9.1 08/14/2021 1415   PROT 7.2 08/14/2021 1415   ALBUMIN 4.1 08/14/2021 1415   AST 18 08/14/2021 1415   ALT 20 08/14/2021 1415   ALKPHOS 55 08/14/2021 1415   BILITOT 0.7 08/14/2021 1415   GFRNONAA >60 08/14/2021 1415   GFRNONAA 87 05/14/2020 0806   GFRAA 101 05/14/2020 0806       Component Value Date/Time   WBC 9.8 08/14/2021 1415   RBC 5.18 08/14/2021 1415   HGB 15.9 08/14/2021 1415   HCT 47.4 08/14/2021 1415   PLT 203 08/14/2021 1415   MCV 91.5 08/14/2021 1415   MCH 30.7 08/14/2021 1415   MCHC 33.5 08/14/2021 1415   RDW 12.0 08/14/2021 1415   LYMPHSABS 1.9 08/14/2021 1415   MONOABS 0.5 08/14/2021 1415   EOSABS 0.1 08/14/2021 1415   BASOSABS 0.1 08/14/2021 1415    No results found for: "POCLITH", "LITHIUM"   Lab Results  Component Value Date   VALPROATE 33.1 (L) 07/15/2019     .res Assessment: Plan:    Patient reports that his mood and anxiety symptoms are currently well controlled and would like to continue current medications without changes at this time. He reports that he experienced some mild sleepiness mid-day, however he reports that benefits are outweighing this side effect at this time and not interfering with function.  Agree with plan to follow-up with cardiology regarding periods of tachycardia. He reports that EKG was normal and did not indicate  QT prolongation. He reports that medical providers have not recommended any medication changes.  Continue Seroquel XR 200 mg in the morning and 400 mg in the evening for mood stabilization.  Continue Klonopin 0.5 mg 1/2-1 tab po qd prn anxiety and panic. Will request that staff call pharmacy to request early refill of Klonopin on 06/11/22 since he will be leaving 06/12/22 to travel out of state.  Continue L-Methylfolate 15 mg po qd for augmentation of depression.  Recommend continuing therapy with Rinaldo Cloud, LCSW.  Pt to follow-up with this provider in 4 weeks or sooner if clinically indicated.  Patient advised to contact office with any questions, adverse effects, or acute worsening in signs and symptoms.   Curt was seen today for follow-up.  Diagnoses and all orders for this visit:  Bipolar II disorder ([place])  Generalized anxiety disorder    Please see After Visit Summary for patient specific instructions.  Future Appointments  Date Time Provider [place]  06/23/2022 10:00 AM Shanon Ace, LCSW [facility] None  07/14/2022  8:00 AM Shanon Ace, LCSW [facility] None    No orders of the defined types were placed in this encounter.     -------------------------------

## 2022-06-23 ENCOUNTER — Ambulatory Visit (INDEPENDENT_AMBULATORY_CARE_PROVIDER_SITE_OTHER): Payer: Medicare PPO | Admitting: Psychiatry

## 2022-06-23 DIAGNOSIS — F3181 Bipolar II disorder: Secondary | ICD-10-CM | POA: Diagnosis not present

## 2022-06-23 NOTE — Progress Notes (Signed)
[facility] Counselor/Therapist Progress Note  Patient ID: Chase Hunter., MRN: [id number],    Date: 06/23/2022  Time Spent: 55 minutes   Treatment Type: Individual Therapy  Reported Symptoms: extreme anxiety, low self worth, suicidal thoughts and told wife yesterday about them  Mental Status Exam:  Appearance:   Neat     Behavior:  Sharing and sometimes"feel like a hostage to my moods"  Motor:  Normal  Speech/Language:   Clear and Coherent  Affect:  Anxious, depressed  Mood:  anxious and depressed  Thought process:  Spiraled in negative direction initially and later pulled himself out  Thought content:    Rumination and some intrusive thoughts  Sensory/Perceptual disturbances:    WNL currently but often has "episodes like a frenzy at times"   Orientation:  oriented to person, place, time/date, situation, day of week, month of year, year, and stated date of June 23, 2022  Attention:  Good  Concentration:  Good  Memory:  Some short term memory issues reported  Fund of knowledge:   Good  Insight:    Good and "good most of the time"  Judgment:   "Can be good or fair/poor based on mood"   Risk Assessment: Danger to Self:  No Self-injurious Behavior: No Danger to Others: No Duty to Warn:no Physical Aggression / Violence:No  Access to Firearms a concern: No  Gang Involvement:No   Subjective:   Patient in today for appointment and reports extreme anxiety, low self worth, suicidal thoughts and told his wife yesterday about his thoughts. Communication issues with wife and times and easy to feel misunderstood and not necessarily interpreting social cues accurately. Cited a couple examples recently among family. Irritability. Expresses high level of understanding although seemed to not totally understanding of others. "Bottom line, the whole trip they were on (when patient was feeling more unstable )over several states was too much for me. Talked out situation with wife  and here in office today and states when he and wife talked last night and "I actually got to a better place then and do not currently have the suicidal thoughts."  Did feel their talk last night was helpful then and into the future. Needing to work on: "realizing I'm not always right", "don't trust my perceptions or my self talk when I'm in bad mood",  and start regulating my social interactions in "concrete ways" and know "what I'd be stepping into and make my decision as to whether I want to go or if it might be better for me not to go."  Became calmer in session today the more he talked.  Did say that he is seeing the heart doctor this week due to "a flutter" and "my mind may be causing it." Shared that 61 yr old daughter was recently diagnosed "premature atrial contraction". Feels "I'm doing pretty good on not fixating on things" and gave a couple examples of this. "Trying not to micro-manage everything". Doing "better at suppressing the negative self-talk and not looking as much for things that make him feel ashamed." Processed some "visual sensory issues" and adds the "seroquel med helps that".  States he is feeling better after talking yesterday with wife and coming in today and talking.  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  Progress is assessed each session and documented in the "subject" and/or "plan" sections of treatment note. Long-term goal: Reduce overall level, frequency, and intensity of the depression and anxiety so that daily functioning is not impaired. Short-term goal: Verbalize an understanding of the role that fearful thinking plays in creating fears, excessive worry, and persistent anxiety/depressive  symptoms. Strategies: Identify, challenge, and replace fearful self-talk with positive, realistic, and empowering self-talk.   Diagnosis:   ICD-10-CM   1. Bipolar II disorder ([place])  F31.81      Plan:  Patient today showing good participation and motivation as he focused on recent increase in anxiety, low self worth and some SI which he reports he is no longer happening. Described and processed a very difficult 2 weeks and he and wife seemed to arrive at better understanding and helpful ways of managing difficult circumstance for patient and wife due to patient's emotional health issues. Good motivation and participation in session. Encouraged his boundaries with certain people as he stated in session and trying not make assumptions as to how events/conversations will go ahead of time and trying to stay more in the moment. Encouraged patient in practicing more positive behaviors as discussed in sessions including: Staying in the present and focusing on what he can change versus cannot, working with strategies for decreasing anxious thoughts, staying in touch with people who are supportive, trying not to "look for things to worry about", look for more positives versus negatives each day, remain on his prescribed medication, stay in contact with others that feel comfortable to him, healthy nutrition and exercise, increase positive self talk, and recognize the strength he shows when working with goal-directed behaviors moving in a direction that supports his improved emotional health.  Goal review and progress/challenges noted with patient.  Next appointment within 2 to 3 weeks.  This record has been created using [employer].  Chart creation errors have been sought, but may not always have been located and corrected.  Such creation errors do not reflect on the standard of medical care provided.   Shanon Ace, LCSW

## 2022-06-24 ENCOUNTER — Encounter: Payer: Self-pay | Admitting: Cardiovascular Disease

## 2022-06-24 NOTE — Progress Notes (Unsigned)
Cardiology Office Note:    Date:  06/25/2022   ID:  Chase Reichert., DOB [birth year], MRN [id number]  PCP:  Antony Contras, MD   [place] Providers Cardiologist:  new to Chase Hunter    Referring MD: Antony Contras, MD   Chief Complaint  Patient presents with   Palpitations          History of Present Illness:    Chase Tindel. is a 61 y.o. male with a hx of bipolar disease, HLD, HTN and palpitatons  We were asked to see him for palpitations   Has noticed fast HR on occasion.   HR feels irregular   Episodes of tachycardia might last as long as 30 minutes.  Will slowly resolve back down to the normal heart rate.  These episodes occur without any exertion.  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  He becomes very confused. Family history of atrial fibrillation in his mother.  No history of premature coronary artery disease.   Does not snore to his knowledge   We discussed Kardia mobile    Past Medical History:  Diagnosis Date   Allergy    Anemia    Asthma    Bipolar II disorder ([place])    Depression    Gallstones    GERD (gastroesophageal reflux disease)    Hx of adenomatous colonic polyps    Hyperlipidemia    Hypertension    Pancreatitis    Seizures ([place])    past hx 6 yrs ago x 1- coming off meds caused 1 seizure     Past Surgical History:  Procedure Laterality Date   COLONOSCOPY     ESOPHAGOGASTRODUODENOSCOPY N/A 07/19/2020   Procedure: ESOPHAGOGASTRODUODENOSCOPY (EGD);  Surgeon: Milus Banister, MD;  Location: [facility];  Service: Endoscopy;  Laterality: N/A;   EUS N/A 07/19/2020   Procedure: UPPER ENDOSCOPIC ULTRASOUND (EUS) RADIAL;  Surgeon: Milus Banister, MD;  Location: [facility];  Service: Endoscopy;   Laterality: N/A;   POLYPECTOMY      Current Medications: Current Meds  Medication Sig   albuterol (PROVENTIL) (2.5 MG/3ML) 0.083% nebulizer solution Take 2.5 mg by nebulization every 6 (six) hours as needed for wheezing or shortness of breath.    albuterol (VENTOLIN HFA) 108 (90 Base) MCG/ACT inhaler Inhale 2 puffs into the lungs every 6 (six) hours as needed for wheezing or shortness of breath.   amLODipine (NORVASC) 5 MG tablet Take 0.5 tablets (2.5 mg total) by mouth at bedtime.   Cholecalciferol (VITAMIN D3) 125 MCG (5000 UT) TABS Take 5,000 Units by mouth daily.    [START ON 07/09/2022] clonazePAM (KLONOPIN) 0.5 MG tablet Take 1/2-1 tablet BY MOUTH EVERY DAY AS NEEDED FOR anxiety AND panic.   fluticasone (FLONASE) 50 MCG/ACT nasal spray Place into both nostrils daily as needed.   fluticasone-salmeterol (ADVAIR) 250-50 MCG/ACT AEPB Inhale 1 puff into the lungs as needed.   INSULIN SYRINGE 1CC/29G (EXEL COMFORT POINT INSULIN SYR) 29G X 1/2" 1 ML MISC Inject testosterone 0.5 ml weekly   lisinopril (ZESTRIL) 40 MG tablet Take 1 tablet (40 mg total) by mouth daily.   Omega-3 Fatty Acids (FISH OIL) 1200 MG CAPS Take 1,200 mg by mouth  daily.    QUEtiapine (SEROQUEL XR) 400 MG 24 hr tablet Take 1 tablet (400 mg total) by mouth 2 (two) times daily.   testosterone cypionate (DEPOTESTOSTERONE CYPIONATE) 200 MG/ML injection Inject 0.5 mLs (100 mg total) into the muscle once a week.   Vitamins-Lipotropics (LIPOFLAVONOID PO) Take by mouth.     Allergies:   Cephalexin, Depakote [divalproex sodium], Trazodone and nefazodone, Lamictal [lamotrigine], and Lipitor [atorvastatin]   Social History   Socioeconomic History   Marital status: Married    Spouse name: Not on file   Number of children: 1   Years of education: Not on file   Highest education level: Not on file  Occupational History   Occupation: retired Pharmacist, hospital  Tobacco Use   Smoking status: Never   Smokeless tobacco: Never  Substance and  Sexual Activity   Alcohol use: Never   Drug use: Not Currently   Sexual activity: Not on file  Other Topics Concern   Not on file  Social History Narrative   Not on file   Social Determinants of Health   Financial Resource Strain: Low Risk  (06/14/2021)   Overall Financial Resource Strain (CARDIA)    Difficulty of Paying Living Expenses: Not hard at all  Food Insecurity: No Food Insecurity (06/14/2021)   Hunger Vital Sign    Worried About Running Out of Food in the Last Year: Never true    [place] in the Last Year: Never true  Transportation Needs: No Transportation Needs (06/14/2021)   PRAPARE - Hydrologist (Medical): No    Lack of Transportation (Non-Medical): No  Physical Activity: Insufficiently Active (06/14/2021)   Exercise Vital Sign    Days of Exercise per Week: 3 days    Minutes of Exercise per Session: 40 min  Stress: Stress Concern Present (06/14/2021)   [place]    Feeling of Stress : Very much  Social Connections: Moderately Integrated (06/14/2021)   Social Connection and Isolation Panel [NHANES]    Frequency of Communication with Friends and Family: Three times a week    Frequency of Social Gatherings with Friends and Family: Three times a week    Attends Religious Services: Never    Active Member of Clubs or Organizations: Yes    Attends Music therapist: More than 4 times per year    Marital Status: Married     Family History: The patient's family history includes Anxiety disorder in his brother, daughter, father, and mother; Atrial fibrillation in his mother; Brain cancer in his father; Mood Disorder in his mother; Obesity in his mother; Prostate cancer in his father. There is no history of Colon cancer, Colon polyps, Esophageal cancer, Rectal cancer, or Stomach cancer.  ROS:   Please see the history of present illness.     All other  systems reviewed and are negative.  EKGs/Labs/Other Studies Reviewed:    The following studies were reviewed today:   EKG: June 25, 2022: Normal sinus rhythm at 93.  Nonspecific T wave abnormality.  Recent Labs: 08/14/2021: ALT 20; BUN 15; Creatinine, Ser 0.87; Hemoglobin 15.9; Platelets 203; Potassium 3.7; Sodium 136  Recent Lipid Panel    Component Value Date/Time   CHOL 294 (H) 11/30/2020 0738   TRIG 178.0 (H) 11/30/2020 0738   HDL 47.00 11/30/2020 0738   CHOLHDL 6 11/30/2020 0738   VLDL 35.6 11/30/2020 0738   LDLCALC 212 (H) 11/30/2020 0738   LDLCALC  145 (H) 05/14/2020 0806   LDLDIRECT 176.0 07/15/2019 0837     Risk Assessment/Calculations:      HYPERTENSION CONTROL Vitals:   06/25/22 1521 06/25/22 1526  BP: 130/86 (!) 128/90    The patient's blood pressure is elevated above target today.  In order to address the patient's elevated BP: Blood pressure will be monitored at home to determine if medication changes need to be made.            Physical Exam:    VS:  BP (!) 128/90 (BP Location: Right Arm, Patient Position: Sitting, Cuff Size: Normal)   Pulse 93   Ht 5' 9.5" (1.765 m)   Wt 183 lb (83 kg)   BMI 26.64 kg/m     Wt Readings from Last 3 Encounters:  06/25/22 183 lb (83 kg)  08/16/21 176 lb (79.8 kg)  04/05/21 181 lb (82.1 kg)     GEN:  Well nourished, well developed in no acute distress HEENT: Normal NECK: No JVD; No carotid bruits LYMPHATICS: No lymphadenopathy CARDIAC: RRR, no murmurs, rubs, gallops RESPIRATORY:  Clear to auscultation without rales, wheezing or rhonchi  ABDOMEN: Soft, non-tender, non-distended MUSCULOSKELETAL:  No edema; No deformity  SKIN: Warm and dry NEUROLOGIC:  Alert and oriented x 3 PSYCHIATRIC:  Normal affect   ASSESSMENT:    1. Sinus tachycardia   2. Mixed hyperlipidemia    PLAN:    In order of problems listed above:  Tachypalpitations: He complains of having episodes of tachypalpitations.  These occur  several times a month.  He describes it as an irregular fast heart rate.  There is no syncope or presyncope.  There is no chest pain.  I would like to place a 14-day event monitor on him for further evaluation.  We also discussed having him use a  Kardia mobile device  2.  Hyperlipidemia: His LDL is 195.  He is tried numerous statins but this causes confusion and neurologic issues.  I think we should get a coronary calcium score.  If his "coronary calcium score is high then we will refer him to the lipid clinic for consideration of a PCSK9 inhibitor.  I will see him back in the office in 3 months.           Medication Adjustments/Labs and Tests Ordered: Current medicines are reviewed at length with the patient today.  Concerns regarding medicines are outlined above.  Orders Placed This Encounter  Procedures   CT CARDIAC SCORING (SELF PAY ONLY)   LONG TERM MONITOR (3-14 DAYS)   EKG 12-Lead   No orders of the defined types were placed in this encounter.     Patient Instructions  Medication Instructions:  Your physician recommends that you continue on your current medications as directed. Please refer to the Current Medication list given to you today.  *If you need a refill on your cardiac medications before your next appointment, please call your pharmacy*  Testing/Procedures: Your physician has requested that you have a calcium score CT scan. There is a $99 fee for the scan.   Your physician has recommended that you wear an event monitor. Event monitors are medical devices that record the heart's electrical activity. Doctors most often [place] these monitors to diagnose arrhythmias. Arrhythmias are problems with the speed or rhythm of the heartbeat. The monitor is a small, portable device. You can wear one while you do your normal daily activities. This is usually used to diagnose what is causing palpitations/syncope (passing out).  Follow-Up: At [facility], you and your  health needs are our priority.  As part of our continuing mission to provide you with exceptional heart care, we have created designated Provider Care Teams.  These Care Teams include your primary Cardiologist (physician) and Advanced Practice Providers (APPs -  Physician Assistants and Nurse Practitioners) who all work together to provide you with the care you need, when you need it.  Your next appointment:   3 month(s)  The format for your next appointment:   In Person  Provider:   Mertie Moores, MD  Other Instructions ZIO XT- Long Term Monitor Instructions  Your physician has requested you wear a ZIO patch monitor for 14 days.  This is a single patch monitor. [employer] supplies one patch monitor per enrollment. Additional stickers are not available. Please do not apply patch if you will be having a Nuclear Stress Test,  Echocardiogram, Cardiac CT, MRI, or Chest Xray during the period you would be wearing the  monitor. The patch cannot be worn during these tests. You cannot remove and re-apply the  ZIO XT patch monitor.  Your ZIO patch monitor will be mailed 3 day USPS to your address on file. It may take 3-5 days  to receive your monitor after you have been enrolled.  Once you have received your monitor, please review the enclosed instructions. Your monitor  has already been registered assigning a specific monitor serial # to you.  Billing and Patient Assistance Program Information  We have supplied [employer] with any of your insurance information on file for billing purposes. [employer] offers a sliding scale Patient Assistance Program for patients that do not have  insurance, or whose insurance does not completely cover the cost of the ZIO monitor.  You must apply for the Patient Assistance Program to qualify for this discounted rate.  To apply, please call [employer] at [phone], select option 4, select option 2, ask to apply for  Patient Assistance Program. Theodore Demark will ask your  household income, and how many people  are in your household. They will quote your out-of-pocket cost based on that information.  [employer] will also be able to set up a 47-month interest-free payment plan if needed.  Applying the monitor   Shave hair from upper left chest.  Hold abrader disc by orange tab. Rub abrader in 40 strokes over the upper left chest as  indicated in your monitor instructions.  Clean area with 4 enclosed alcohol pads. Let dry.  Apply patch as indicated in monitor instructions. Patch will be placed under collarbone on left  side of chest with arrow pointing upward.  Rub patch adhesive wings for 2 minutes. Remove white label marked "1". Remove the white  label marked "2". Rub patch adhesive wings for 2 additional minutes.  While looking in a mirror, press and release button in center of patch. A small green light will  flash 3-4 times. This will be your only indicator that the monitor has been turned on.  Do not shower for the first 24 hours. You may shower after the first 24 hours.  Press the button if you feel a symptom. You will hear a small click. Record Date, Time and  Symptom in the Patient Logbook.  When you are ready to remove the patch, follow instructions on the last 2 pages of Patient  Logbook. Stick patch monitor onto the last page of Patient Logbook.  Place Patient Logbook in the blue and white box. Use locking tab on  box and tape box closed  securely. The blue and white box has prepaid postage on it. Please place it in the mailbox as  soon as possible. Your physician should have your test results approximately 7 days after the  monitor has been mailed back to [facility].  Call [place] at [phone] if you have questions regarding  your ZIO XT patch monitor. Call them immediately if you see an orange light blinking on your  monitor.  If your monitor falls off in less than 4 days, contact our Monitor department at [phone].   If your monitor becomes loose or falls off after 4 days call [employer] at [phone] for  suggestions on securing your monitor   Important Information About Sugar         Signed, Mertie Moores, MD  06/25/2022 5:34 PM    [place]

## 2022-06-25 ENCOUNTER — Encounter: Payer: Self-pay | Admitting: Psychiatry

## 2022-06-25 ENCOUNTER — Ambulatory Visit: Payer: Medicare PPO | Admitting: Psychiatry

## 2022-06-25 ENCOUNTER — Ambulatory Visit (INDEPENDENT_AMBULATORY_CARE_PROVIDER_SITE_OTHER): Payer: Medicare PPO

## 2022-06-25 ENCOUNTER — Ambulatory Visit: Payer: Medicare PPO | Attending: Cardiovascular Disease | Admitting: Cardiovascular Disease

## 2022-06-25 ENCOUNTER — Encounter: Payer: Self-pay | Admitting: Cardiovascular Disease

## 2022-06-25 VITALS — BP 128/90 | HR 93 | Ht 69.5 in | Wt 183.0 lb

## 2022-06-25 DIAGNOSIS — R000 Tachycardia, unspecified: Secondary | ICD-10-CM

## 2022-06-25 DIAGNOSIS — F3163 Bipolar disorder, current episode mixed, severe, without psychotic features: Secondary | ICD-10-CM | POA: Diagnosis not present

## 2022-06-25 DIAGNOSIS — F410 Panic disorder [episodic paroxysmal anxiety] without agoraphobia: Secondary | ICD-10-CM

## 2022-06-25 DIAGNOSIS — E782 Mixed hyperlipidemia: Secondary | ICD-10-CM

## 2022-06-25 DIAGNOSIS — F3181 Bipolar II disorder: Secondary | ICD-10-CM

## 2022-06-25 MED ORDER — CLONAZEPAM 0.5 MG PO TABS: ORAL_TABLET | ORAL | 2 refills | Status: DC

## 2022-06-25 MED ORDER — QUETIAPINE FUMARATE ER 400 MG PO TB24: 400.0000 mg | ORAL_TABLET | Freq: Two times a day (BID) | ORAL | 0 refills | Status: DC

## 2022-06-25 NOTE — Patient Instructions (Signed)
Medication Instructions:  Your physician recommends that you continue on your current medications as directed. Please refer to the Current Medication list given to you today.  *If you need a refill on your cardiac medications before your next appointment, please call your pharmacy*  Testing/Procedures: Your physician has requested that you have a calcium score CT scan. There is a $99 fee for the scan.   Your physician has recommended that you wear an event monitor. Event monitors are medical devices that record the heart's electrical activity. Doctors most often [place] these monitors to diagnose arrhythmias. Arrhythmias are problems with the speed or rhythm of the heartbeat. The monitor is a small, portable device. You can wear one while you do your normal daily activities. This is usually used to diagnose what is causing palpitations/syncope (passing out).   Follow-Up: At [facility], you and your health needs are our priority.  As part of our continuing mission to provide you with exceptional heart care, we have created designated Provider Care Teams.  These Care Teams include your primary Cardiologist (physician) and Advanced Practice Providers (APPs -  Physician Assistants and Nurse Practitioners) who all work together to provide you with the care you need, when you need it.  Your next appointment:   3 month(s)  The format for your next appointment:   In Person  Provider:   Mertie Moores, MD  Other Instructions ZIO XT- Long Term Monitor Instructions  Your physician has requested you wear a ZIO patch monitor for 14 days.  This is a single patch monitor. [employer] supplies one patch monitor per enrollment. Additional stickers are not available. Please do not apply patch if you will be having a Nuclear Stress Test,  Echocardiogram, Cardiac CT, MRI, or Chest Xray during the period you would be wearing the  monitor. The patch cannot be worn during these tests. You cannot remove and  re-apply the  ZIO XT patch monitor.  Your ZIO patch monitor will be mailed 3 day USPS to your address on file. It may take 3-5 days  to receive your monitor after you have been enrolled.  Once you have received your monitor, please review the enclosed instructions. Your monitor  has already been registered assigning a specific monitor serial # to you.  Billing and Patient Assistance Program Information  We have supplied [employer] with any of your insurance information on file for billing purposes. [employer] offers a sliding scale Patient Assistance Program for patients that do not have  insurance, or whose insurance does not completely cover the cost of the ZIO monitor.  You must apply for the Patient Assistance Program to qualify for this discounted rate.  To apply, please call [employer] at [phone], select option 4, select option 2, ask to apply for  Patient Assistance Program. Theodore Demark will ask your household income, and how many people  are in your household. They will quote your out-of-pocket cost based on that information.  [employer] will also be able to set up a 13-month interest-free payment plan if needed.  Applying the monitor   Shave hair from upper left chest.  Hold abrader disc by orange tab. Rub abrader in 40 strokes over the upper left chest as  indicated in your monitor instructions.  Clean area with 4 enclosed alcohol pads. Let dry.  Apply patch as indicated in monitor instructions. Patch will be placed under collarbone on left  side of chest with arrow pointing upward.  Rub patch adhesive wings for 2 minutes. Remove white  label marked "1". Remove the white  label marked "2". Rub patch adhesive wings for 2 additional minutes.  While looking in a mirror, press and release button in center of patch. A small green light will  flash 3-4 times. This will be your only indicator that the monitor has been turned on.  Do not shower for the first 24 hours. You may shower after the  first 24 hours.  Press the button if you feel a symptom. You will hear a small click. Record Date, Time and  Symptom in the Patient Logbook.  When you are ready to remove the patch, follow instructions on the last 2 pages of Patient  Logbook. Stick patch monitor onto the last page of Patient Logbook.  Place Patient Logbook in the blue and white box. Use locking tab on box and tape box closed  securely. The blue and white box has prepaid postage on it. Please place it in the mailbox as  soon as possible. Your physician should have your test results approximately 7 days after the  monitor has been mailed back to [facility].  Call Gerty at [phone] if you have questions regarding  your ZIO XT patch monitor. Call them immediately if you see an orange light blinking on your  monitor.  If your monitor falls off in less than 4 days, contact our Monitor department at [phone].  If your monitor becomes loose or falls off after 4 days call [employer] at [phone] for  suggestions on securing your monitor   Important Information About Sugar

## 2022-06-25 NOTE — Progress Notes (Unsigned)
Enrolled patient for a 14 day Zio XT  monitor to be mailed to patients home  °

## 2022-06-25 NOTE — Progress Notes (Signed)
Chase Hunter [id number] September 15, 1960 61 y.o.  Subjective:   Patient ID:  Chase Hunter. is a 61 y.o. (DOB 1960-12-03) male.  Chief Complaint:  Chief Complaint  Patient presents with   Other    Mood lability   Anxiety    Anxiety Symptoms include palpitations.     [name] presents to the office today for mood lability and anxiety. He reports that recent trip to [place] to visit family was, "a pretty disastrous trip." He reports difficulty interacting with others on his trip due to symptoms. He reports, "my moods affected my behavior." He reports that he is now considering limiting his social interaction. He reports he is more anxious around others and "I can't trust my judgment with how things are going" in social interactions.   He notices some mood lability and cycling. He reports that he notices periods of mild agitation followed by some depression. He reports that he had some recent agitation and that he is now cycling towards depression. He describes "agitation" as "fear... no identifiable source... all of a sudden" and that it may be present immediately upon awakening. He reports that his wife has noticed some irritability. He reports that he is sleeping 8-9 hours a night. He reports that his energy has been "good" and reports that he is careful not to engage in increased goal-directed activity although he notices this impulse. Denies change in appetite. Concentration has been adequate. Describes some racing thoughts. He reports that he recognizes that he has misinterpreted/ overly personalized statements from others. He reports that he has probably been more talkative. He reports some recent suicidal thoughts- "the usual kind." Denies current suicidal thoughts.   He reports intrusive thoughts and racing thoughts are improved with Seroquel XR. He reports "the overall feeling of stress" is chief complaint. He reports klonopin is helpful when he feels  anxiety starting to escalate, however this has not been helpful when taken consistently.   He reports that these s/s seemed to start June 2022. He reports that "my brain and my body are different" since then and having pancreatitis in late 2021. He reports that when he took Seroquel XR 600 mg in the past he was 60 lbs heavier and at that time it caused him to stare off and be "like a zombie."   He reports that he is seeing cardiologist later today. He reports that he has had a few more episodes of increased HR and possible fluttering. He reports that on a few occasions he has checked his HR when symptomatic and HR has been around 120.  He reports that he has been using a hemp product to help with anxiety/ startle response. He reports that he feels as if someone has just startled him, however this is prolonged.   Past Psychiatric Medication Trials: Lamictal- Rash. Had seizure when it was stopped.  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 Abilify- cannot recall response. Minimal improvement. Seroquel XR-Affective dulling, excessive somnolence. Took up to 600 mg QHS Seroquel Saphris- SI, uncontrolled crying Latuda Trazodone- Increased BP and suicidal thoughts Propranolol- Raised BP Clonidine- increased panic, crying, and SI Sertraline- Worsening mood symptoms and anxiety Lexapro Wellbutrin Duloxetine-excessively talkative. "Saying things out loud that I thought I was just thinking."  Buspar-Increased anxiety and increased BP.  Klonopin Diazepam- Paradoxical effect    AIMS    Flowsheet Row Office Visit from 03/21/2022 in [place]  Visit from 10/09/2021 in [facility] Visit from 08/12/2021 in [facility] Total Score 0 0 0      PHQ2-9    [place] Office Visit from 08/16/2021 in [facility] from  06/14/2021 in [facility] from 06/13/2020 in [facility] at [facility] Total Score 5 2 0  PHQ-9 Total Score 20 12 --      Flowsheet Row ED from 08/14/2021 in [facility] from 06/14/2021 in [facility] at [facility] No Risk Error: Q3, 4, or 5 should not be populated when Q2 is No        Review of Systems:  Review of Systems  Cardiovascular:  Positive for palpitations.  Musculoskeletal:  Negative for gait problem.  Psychiatric/Behavioral:         Please refer to HPI    Medications: I have reviewed the patient's current medications.  Current Outpatient Medications  Medication Sig Dispense Refill   albuterol (PROVENTIL) (2.5 MG/3ML) 0.083% nebulizer solution Take 2.5 mg by nebulization every 6 (six) hours as needed for wheezing or shortness of breath.      albuterol (VENTOLIN HFA) 108 (90 Base) MCG/ACT inhaler Inhale 2 puffs into the lungs every 6 (six) hours as needed for wheezing or shortness of breath. 18 g 1   amLODipine (NORVASC) 5 MG tablet Take 0.5 tablets (2.5 mg total) by mouth at bedtime. 30 tablet 1   Cholecalciferol (VITAMIN D3) 125 MCG (5000 UT) TABS Take 5,000 Units by mouth daily.      [START ON 07/09/2022] clonazePAM (KLONOPIN) 0.5 MG tablet Take 1/2-1 tablet BY MOUTH EVERY DAY AS NEEDED FOR anxiety AND panic. 30 tablet 2   fluticasone (FLONASE) 50 MCG/ACT nasal spray Place into both nostrils daily as needed.     fluticasone-salmeterol (ADVAIR) 250-50 MCG/ACT AEPB Inhale 1 puff into the lungs in the morning and at bedtime. (Patient not taking: Reported on 09/05/2021)     INSULIN SYRINGE 1CC/29G (EXEL COMFORT POINT INSULIN SYR) 29G X 1/2" 1 ML MISC Inject testosterone 0.5 ml weekly 100 each 2   L-Methylfolate-Algae (DEPLIN 15) 15-90.314 MG CAPS Take 15 mg by mouth daily. 90 capsule 3   lisinopril (ZESTRIL) 40 MG tablet Take 1 tablet (40 mg total)  by mouth daily. 90 tablet 2   Omega-3 Fatty Acids (FISH OIL) 1200 MG CAPS Take 1,200 mg by mouth daily.      QUEtiapine (SEROQUEL XR) 400 MG 24 hr tablet Take 1 tablet (400 mg total) by mouth 2 (two) times daily. 90 tablet 0   testosterone cypionate (DEPOTESTOSTERONE CYPIONATE) 200 MG/ML injection Inject 0.5 mLs (100 mg total) into the muscle once a week. 2.5 mL 3   Vitamins-Lipotropics (LIPOFLAVONOID PO) Take by mouth.     No current facility-administered medications for this visit.    Medication Side Effects: None  Allergies:  Allergies  Allergen Reactions   Cephalexin Hives   Depakote [Divalproex Sodium]     Caused Pancreatitis   Trazodone And Nefazodone     Suicidal thoughts   Lamictal [Lamotrigine] Rash    Had a seizure when he stopped it   Lipitor [Atorvastatin] Rash    Other reaction(s): Other (See Comments) Memory issues that have not resolved    Past Medical History:  Diagnosis Date   Allergy    Anemia    Asthma    Bipolar II disorder ([place])  Depression    Gallstones    GERD (gastroesophageal reflux disease)    Hx of adenomatous colonic polyps    Hyperlipidemia    Hypertension    Pancreatitis    Seizures (HCC)    past hx 6 yrs ago x 1- coming off meds caused 1 seizure     Past Medical History, Surgical history, Social history, and Family history were reviewed and updated as appropriate.   Please see review of systems for further details on the patient's review from today.   Objective:   Physical Exam:  There were no vitals taken for this visit.  Physical Exam Constitutional:      General: He is not in acute distress. Musculoskeletal:        General: No deformity.  Neurological:     Mental Status: He is alert and oriented to person, place, and time.     Coordination: Coordination normal.  Psychiatric:        Attention and Perception: Attention and perception normal. He does not perceive auditory or visual hallucinations.        Mood and Affect:  Mood is anxious. Affect is not labile, blunt, angry or inappropriate.        Behavior: Behavior normal.        Thought Content: Thought content normal. Thought content is not paranoid or delusional. Thought content does not include homicidal or suicidal ideation. Thought content does not include homicidal or suicidal plan.        Cognition and Memory: Cognition and memory normal.        Judgment: Judgment normal.     Comments: Insight intact Dysphoric mood Speech is mildly pressured. More talkative compared to baseline     Lab Review:     Component Value Date/Time   NA 136 08/14/2021 1415   K 3.7 08/14/2021 1415   CL 105 08/14/2021 1415   CO2 23 08/14/2021 1415   GLUCOSE 90 08/14/2021 1415   BUN 15 08/14/2021 1415   CREATININE 0.87 08/14/2021 1415   CREATININE 0.96 05/14/2020 0806   CALCIUM 9.1 08/14/2021 1415   PROT 7.2 08/14/2021 1415   ALBUMIN 4.1 08/14/2021 1415   AST 18 08/14/2021 1415   ALT 20 08/14/2021 1415   ALKPHOS 55 08/14/2021 1415   BILITOT 0.7 08/14/2021 1415   GFRNONAA >60 08/14/2021 1415   GFRNONAA 87 05/14/2020 0806   GFRAA 101 05/14/2020 0806       Component Value Date/Time   WBC 9.8 08/14/2021 1415   RBC 5.18 08/14/2021 1415   HGB 15.9 08/14/2021 1415   HCT 47.4 08/14/2021 1415   PLT 203 08/14/2021 1415   MCV 91.5 08/14/2021 1415   MCH 30.7 08/14/2021 1415   MCHC 33.5 08/14/2021 1415   RDW 12.0 08/14/2021 1415   LYMPHSABS 1.9 08/14/2021 1415   MONOABS 0.5 08/14/2021 1415   EOSABS 0.1 08/14/2021 1415   BASOSABS 0.1 08/14/2021 1415    No results found for: "POCLITH", "LITHIUM"   Lab Results  Component Value Date   VALPROATE 33.1 (L) 07/15/2019     .res Assessment: Plan:    Pt seen for 30 minutes and time spent discussing recent symptoms and treatment options. Discussed that he is experiencing bother depressive and hypomanic symptoms simultaneously, which is consistent with a mixed episode. Discussed that his symptoms also are consistent  with bipolar disorder with anxious distress. Recommended increasing Seroquel XR to stabilize mood and to also improve anxiety. Discussed that Seroquel XR has been the most effective  medication for his mood symptoms and better tolerated compared to other medication trials. Discussed option to increase Seroquel XR to 700 mg or 800 mg. He reports that he prefers to increase to 800 mg total daily dose. He reports that divided dose has been helpful for him and he would like to increase Seroquel XR to 400 mg po BID for mood stabilization and anxiety.  Will continue Klonopin 0.5 mg 1/2-1 tablet as needed for anxiety panic.  Continue L-Methylfolate 15 mg daily for depression and MTHFR mutation.  Pt to follow-up with this provider in 3 weeks or sooner if clinically indicated.  Patient advised to contact office with any questions, adverse effects, or acute worsening in signs and symptoms.   Treyce was seen today for other and anxiety.  Diagnoses and all orders for this visit:  Severe bipolar II disorder, depressed, with anxious distress (HCC) -     QUEtiapine (SEROQUEL XR) 400 MG 24 hr tablet; Take 1 tablet (400 mg total) by mouth 2 (two) times daily.  Bipolar disorder, current episode mixed, severe, without psychotic features ([place])  Panic -     clonazePAM (KLONOPIN) 0.5 MG tablet; Take 1/2-1 tablet BY MOUTH EVERY DAY AS NEEDED FOR anxiety AND panic.     Please see After Visit Summary for patient specific instructions.  Future Appointments  Date Time Provider [place]  06/25/2022  3:20 PM Nahser, Wonda Cheng, MD [facility]  07/14/2022  8:00 AM Shanon Ace, LCSW [facility] None  07/16/2022  1:45 PM Thayer Headings, [place] [facility] None  08/04/2022  8:00 AM Shanon Ace, LCSW [facility] None    No orders of the defined types were placed in this encounter.   -------------------------------

## 2022-06-29 DIAGNOSIS — R000 Tachycardia, unspecified: Secondary | ICD-10-CM | POA: Diagnosis not present

## 2022-07-07 ENCOUNTER — Telehealth: Payer: Self-pay | Admitting: Psychiatry

## 2022-07-07 ENCOUNTER — Encounter (HOSPITAL_COMMUNITY): Payer: Self-pay | Admitting: Psychiatry

## 2022-07-07 ENCOUNTER — Inpatient Hospital Stay (HOSPITAL_COMMUNITY)
Admission: AD | Admit: 2022-07-07 | Discharge: 2022-07-11 | DRG: 885 | Disposition: A | Discharge status: Home / Self Care | Payer: Medicare PPO | Attending: Psychiatry | Admitting: Psychiatry

## 2022-07-07 ENCOUNTER — Other Ambulatory Visit: Payer: Self-pay

## 2022-07-07 DIAGNOSIS — Z7151 Drug abuse counseling and surveillance of drug abuser: Secondary | ICD-10-CM

## 2022-07-07 DIAGNOSIS — E785 Hyperlipidemia, unspecified: Secondary | ICD-10-CM | POA: Diagnosis not present

## 2022-07-07 DIAGNOSIS — R45851 Suicidal ideations: Secondary | ICD-10-CM | POA: Diagnosis not present

## 2022-07-07 DIAGNOSIS — Z818 Family history of other mental and behavioral disorders: Secondary | ICD-10-CM

## 2022-07-07 DIAGNOSIS — I10 Essential (primary) hypertension: Secondary | ICD-10-CM | POA: Diagnosis not present

## 2022-07-07 DIAGNOSIS — Z79899 Other long term (current) drug therapy: Secondary | ICD-10-CM

## 2022-07-07 DIAGNOSIS — K219 Gastro-esophageal reflux disease without esophagitis: Secondary | ICD-10-CM | POA: Diagnosis present

## 2022-07-07 DIAGNOSIS — F411 Generalized anxiety disorder: Secondary | ICD-10-CM | POA: Insufficient documentation

## 2022-07-07 DIAGNOSIS — F410 Panic disorder [episodic paroxysmal anxiety] without agoraphobia: Secondary | ICD-10-CM | POA: Diagnosis present

## 2022-07-07 DIAGNOSIS — F12980 Cannabis use, unspecified with anxiety disorder: Secondary | ICD-10-CM | POA: Diagnosis present

## 2022-07-07 DIAGNOSIS — J302 Other seasonal allergic rhinitis: Secondary | ICD-10-CM | POA: Diagnosis not present

## 2022-07-07 DIAGNOSIS — F3181 Bipolar II disorder: Principal | ICD-10-CM | POA: Diagnosis present

## 2022-07-07 DIAGNOSIS — G4700 Insomnia, unspecified: Secondary | ICD-10-CM | POA: Diagnosis not present

## 2022-07-07 DIAGNOSIS — Z1152 Encounter for screening for COVID-19: Secondary | ICD-10-CM

## 2022-07-07 LAB — SARS CORONAVIRUS 2 BY RT PCR: SARS Coronavirus 2 by RT PCR: NEGATIVE

## 2022-07-07 MED ORDER — ACETAMINOPHEN 325 MG PO TABS
650.0000 mg | ORAL_TABLET | Freq: Four times a day (QID) | ORAL | Status: DC | PRN
  Administered 2022-07-09: 650 mg via ORAL
  Filled 2022-07-07: qty 2

## 2022-07-07 MED ORDER — HYDROXYZINE HCL 25 MG PO TABS
25.0000 mg | ORAL_TABLET | Freq: Three times a day (TID) | ORAL | Status: DC | PRN
  Administered 2022-07-07 – 2022-07-11 (×9): 25 mg via ORAL
  Filled 2022-07-07 (×9): qty 1

## 2022-07-07 MED ORDER — MAGNESIUM HYDROXIDE 400 MG/5ML PO SUSP: 30.0000 mL | Freq: Every day | ORAL | Status: DC | PRN

## 2022-07-07 MED ORDER — ALUM & MAG HYDROXIDE-SIMETH 200-200-20 MG/5ML PO SUSP: 30.0000 mL | ORAL | Status: DC | PRN

## 2022-07-07 NOTE — Progress Notes (Signed)
Initial Treatment Plan 07/07/2022 7:02 PM Quintella Reichert. [id number]:[id number]    PATIENT STRESSORS: Health problems   Marital or family conflict     PATIENT STRENGTHS: Ability for insight  Average or above average intelligence  Communication skills  Motivation for treatment/growth  Supportive family/friends    PATIENT IDENTIFIED PROBLEMS: Suicidal ideation  Depression  Anxiety  Panic attacks               DISCHARGE CRITERIA:  Improved stabilization in mood, thinking, and/or behavior Reduction of life-threatening or endangering symptoms to within safe limits Verbal commitment to aftercare and medication compliance  PRELIMINARY DISCHARGE PLAN: Outpatient therapy Return to previous living arrangement Return to previous work or school arrangements  PATIENT/FAMILY INVOLVEMENT: This treatment plan has been presented to and reviewed with the patient, Duey Liller., and/or family member.  The patient and family have been given the opportunity to ask questions and make suggestions.  Harriet Masson, RN 07/07/2022, 7:02 PM

## 2022-07-07 NOTE — Telephone Encounter (Signed)
Patient's wife lvm stating that Linna Hoff is in the [facility] he checked himself in today. Cleda Clarks would like a rtc from [facility]. Ph: [phone]

## 2022-07-07 NOTE — Telephone Encounter (Signed)
Patient's wife lvm stating that Linna Hoff is in the [facility] he checked himself in today. Cleda Clarks would like a rtc from you. Ph: [phone]

## 2022-07-07 NOTE — H&P (Signed)
Behavioral Health Medical Screening Exam  HPI: Chase Hunter. is a 61 y.o. Caucasian male who presents voluntarily as a walk-in to [facility] for worsening suicidal ideation, generalized anxiety and panic attack.  Patient reports that he has history of mental health illness and was diagnosed with bipolar II disorder 8 years ago.  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  Reports he has an argument with the wife last night that triggered his episode with severe and active suicidal thoughts with plan to hang himself by the swing at his backyard.  Reports that he examined the strength of the rope holding the swing together and where he can put his neck for the hanging.  Then this morning he decided to come to behavioral health Hospital rather than complete his thought process of hanging.  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  He lives at home with his wife and 1 daughter.  This is patient's first presentation to [facility] for his mental health disorder.  Assessment: Patient is seen face-to-face and examined in the screen room sitting on a chair.  He appears clean, calm, polite but with teary eyes.  Chart reviewed and findings shared with the treatment team and discussed with Dr. Dwyane Dee.  Alert and oriented x4.  Speech clear with normal pattern and volume.  Maintained good eye contact with this provider.  Presents with anxious/hopeless mood and affect tearful and congruent.  Patient reports severe suicidal thoughts yesterday and this morning after discord with his wife, with the plan of hanging himself at the swing to his back yard.  Reports that he examined the strength of the the rope today with aim of hanging but decided to report to [facility] for help.  Reports he has never been  admitted for his mental illness, however has an outpatient therapist named Rinaldo Cloud and a Thayer Headings, NP Psychiatry who manages his medication, both of them at [facility].  Denies homicidal ideation, paranoia, delusions or AVH. He denies history of self injurious behavior or suicidal attempt in the past except this morning when he was very close to hanging himself. Denies alcohol use, tobacco use, illegal drug use and denies access to firearms at home. Reports history of anxiety and rated as 10/10 on a scale of 0-10, with 10 being the worst.  Reports symptoms of depression to include crying spells, irritability, hopelessness, worthlessness, guilt, and anhedonia.  Reports sleeping for 12 hours last night, reports taking his medications of Seroquel XR 400 mg twice daily for his mood, and Klonopin 0.5 mg daily as needed for anxiety.  Reports family history of mental illness with brother diagnosed with anxiety disorder.  Reports support system to be his wife and daughter. Endorses marijuana of small bowls, 3 times daily as needed for anxiety.  Instruction provided on cessation of marijuana use as it adversely affects overall psychiatric and medical wellbeing.  Patient nodded in agreement.  Disposition: Based on my evaluation of patient, he is at imminent danger to himself.  He meets the criteria for inpatient psychiatric admission and is recommended for admission to the adult unit for mood stabilization, medication management and safety.  Discussed admission process with patient, who is in agreement at this time. COVID-19 standing order placed together with preadmission orders pending negative results from COVID-19 lab test.  Total Time spent  with patient: 30 minutes  Psychiatric Specialty Exam:  Presentation  General Appearance:  Appropriate for Environment; Casual; Fairly Groomed  Eye Contact: Good  Speech: Clear and Coherent; Normal Rate  Speech  Volume: Normal  Handedness: Right  Mood and Affect  Mood: Anxious; Hopeless  Affect: Congruent; Tearful  Thought Process  Thought Processes: Coherent  Descriptions of Associations:Intact  Orientation:Full (Time, Place and Person)  Thought Content:Logical  History of Schizophrenia/Schizoaffective disorder:No data recorded Duration of Psychotic Symptoms:No data recorded Hallucinations:Hallucinations: None  Ideas of Reference:None  Suicidal Thoughts:Suicidal Thoughts: Yes, Active SI Active Intent and/or Plan: With Intent; With Plan; With Means to Carry Out  Homicidal Thoughts:Homicidal Thoughts: No  Sensorium  Memory: Immediate Fair; Recent Fair; Remote Fair  Judgment: Fair  Insight: Fair  Executive Functions  Concentration: Good  Attention Span: Good  Recall: [place] of Knowledge: Fair  Language: Good  Psychomotor Activity  Psychomotor Activity: Psychomotor Activity: Normal  Assets  Assets: Communication Skills; Desire for Improvement; Housing; Physical Health  Sleep  Sleep: Sleep: Good Number of Hours of Sleep: 12  Physical Exam: Physical Exam Vitals reviewed.  Constitutional:      Appearance: Normal appearance.  HENT:     Head: Normocephalic and atraumatic.     Right Ear: External ear normal.     Left Ear: External ear normal.     Nose: Nose normal.     Mouth/Throat:     Mouth: Mucous membranes are moist.     Pharynx: Oropharynx is clear.  Eyes:     Extraocular Movements: Extraocular movements intact.     Conjunctiva/sclera: Conjunctivae normal.     Pupils: Pupils are equal, round, and reactive to light.  Cardiovascular:     Rate and Rhythm: Normal rate.     Pulses: Normal pulses.  Pulmonary:     Effort: Pulmonary effort is normal.  Genitourinary:    Comments: Deferred Musculoskeletal:        General: Normal range of motion.     Cervical back: Normal range of motion.  Skin:    General: Skin is warm.   Neurological:     General: No focal deficit present.     Mental Status: He is alert and oriented to person, place, and time.  Psychiatric:        Mood and Affect: Mood normal.   Review of Systems  Constitutional: Negative.  Negative for chills and fever.  HENT: Negative.  Negative for hearing loss and tinnitus.   Eyes: Negative.  Negative for blurred vision and double vision.  Respiratory: Negative.  Negative for cough, sputum production and shortness of breath.   Cardiovascular: Negative.  Negative for chest pain and palpitations.  Gastrointestinal: Negative.  Negative for abdominal pain, constipation, diarrhea, heartburn, nausea and vomiting.  Genitourinary: Negative.  Negative for dysuria, frequency and urgency.  Musculoskeletal: Negative.  Negative for myalgias and neck pain.  Skin: Negative.  Negative for itching and rash.  Neurological: Negative.  Negative for dizziness, tingling, tremors and headaches.  Endo/Heme/Allergies: Negative.  Negative for environmental allergies and polydipsia. Does not bruise/bleed easily.       Lamictal [Lamotrigine]  Rash Low  05/17/2021 Had a seizure when he stopped it Lipitor [Atorvastatin]  Rash Low  04/19/2014 Other reaction(s): Other (See Comments) Memory issues that have not resolved Adverse Reactions/Drug Intolerances   Depakote [Divalproex Sodium]   Not Specified Intolerance 05/17/2021 Caused Pancreatitis Trazodone And Nefazodone   Not Specified Intolerance 05/17/2021    Psychiatric/Behavioral:  Positive for substance abuse  and suicidal ideas. The patient is nervous/anxious.    There were no vitals taken for this visit. There is no height or weight on file to calculate BMI.  T 98.7, P 100, RR 18, BP 144/96, SAO2 98%  Musculoskeletal: Strength & Muscle Tone: within normal limits Gait & Station: normal Patient leans: N/A  [place] Scale:  Flowsheet Row OP Visit from 07/07/2022 in [place] ED from  08/14/2021 in [place] from 06/14/2021 in [place] at [place] High Risk No Risk Error: Q3, 4, or 5 should not be populated when Q2 is No       Recommendations:  Based on my evaluation the patient appears to have an emergency medical condition for which I recommend the patient be admitted to behavioral health inpatient psychiatric adult unit for mood stabilization, medication management and safety.  Laretta Bolster, FNP 07/07/2022, 11:11 AM

## 2022-07-07 NOTE — Progress Notes (Signed)
Psychoeducational Group Note  Date:  07/07/2022 Time:  2109  Group Topic/Focus:  Wrap-Up Group:   The focus of this group is to help patients review their daily goal of treatment and discuss progress on daily workbooks.  Participation Level: Did Not Attend  Participation Quality:  Not Applicable  Affect:  Not Applicable  Cognitive:  Not Applicable  Insight:  Not Applicable  Engagement in Group: Not Applicable  Additional Comments:  The patient did not attend the A.A.meeting.   Archie Balboa S 07/07/2022, 9:09 PM

## 2022-07-07 NOTE — Telephone Encounter (Signed)
Wife just wanted to make [place] aware that patient was admitted to [facility] today. It is estimated he will be inpatient 5-7 days.

## 2022-07-07 NOTE — Progress Notes (Signed)
UA container placed in patient's room.

## 2022-07-07 NOTE — Progress Notes (Signed)
Patient ID: Chase Hunter., male   DOB: [birth year], 61 y.o.   MRN: [id number]    Pt alert and oriented during [facility] admission. Pt denies SI/HI, AVH, and any pain. Pt is calm and cooperative, and a bit teary-eyed. Education, support, reassurance, and encouragement provided, q15 minute safety checks initiated. Pt's belongings in locker # 19. Pt oriented to the unit and provided with an orientation packet, including pts' rights. Pt denies any concerns at this time, and verbally contracts for safety. Pt ambulating on the unit with no issues. Pt remains safe on the unit.

## 2022-07-08 DIAGNOSIS — G4700 Insomnia, unspecified: Secondary | ICD-10-CM | POA: Diagnosis present

## 2022-07-08 DIAGNOSIS — F411 Generalized anxiety disorder: Secondary | ICD-10-CM | POA: Insufficient documentation

## 2022-07-08 LAB — COMPREHENSIVE METABOLIC PANEL
ALT: 19 U/L (ref 0–44)
AST: 19 U/L (ref 15–41)
Albumin: 4.6 g/dL (ref 3.5–5.0)
Alkaline Phosphatase: 53 U/L (ref 38–126)
Anion gap: 11 (ref 5–15)
BUN: 20 mg/dL (ref 8–23)
CO2: 24 mmol/L (ref 22–32)
Calcium: 9.4 mg/dL (ref 8.9–10.3)
Chloride: 104 mmol/L (ref 98–111)
Creatinine, Ser: 0.9 mg/dL (ref 0.61–1.24)
GFR, Estimated: >60 mL/min (ref >60)
Glucose, Bld: 102 mg/dL — ABNORMAL HIGH (ref 70–99)
Potassium: 4.1 mmol/L (ref 3.5–5.1)
Sodium: 139 mmol/L (ref 135–145)
Total Bilirubin: 1.1 mg/dL (ref 0.3–1.2)
Total Protein: 7.6 g/dL (ref 6.5–8.1)

## 2022-07-08 LAB — LIPID PANEL
Cholesterol: 292 mg/dL — ABNORMAL HIGH (ref 0–200)
HDL: 39 mg/dL — ABNORMAL LOW (ref >40)
LDL Cholesterol: 231 mg/dL — ABNORMAL HIGH (ref 0–99)
Total CHOL/HDL Ratio: 7.5 RATIO
Triglycerides: 109 mg/dL (ref <150)
VLDL: 22 mg/dL (ref 0–40)

## 2022-07-08 LAB — URINALYSIS, COMPLETE (UACMP) WITH MICROSCOPIC
Bacteria, UA: NONE SEEN
Bilirubin Urine: NEGATIVE
Glucose, UA: 150 mg/dL — AB
Hgb urine dipstick: NEGATIVE
Ketones, ur: 20 mg/dL — AB
Leukocytes,Ua: NEGATIVE
Nitrite: NEGATIVE
Protein, ur: NEGATIVE mg/dL
Specific Gravity, Urine: 1.014 (ref 1.005–1.030)
pH: 6 (ref 5.0–8.0)

## 2022-07-08 LAB — HEMOGLOBIN A1C
Hgb A1c MFr Bld: 5.1 % (ref 4.8–5.6)
Mean Plasma Glucose: 99.67 mg/dL

## 2022-07-08 LAB — RAPID URINE DRUG SCREEN, HOSP PERFORMED
Amphetamines: NOT DETECTED
Barbiturates: NOT DETECTED
Benzodiazepines: POSITIVE — AB
Cocaine: NOT DETECTED
Opiates: NOT DETECTED
Tetrahydrocannabinol: POSITIVE — AB

## 2022-07-08 LAB — CBC
HCT: 46.4 % (ref 39.0–52.0)
Hemoglobin: 15.7 g/dL (ref 13.0–17.0)
MCH: 31.2 pg (ref 26.0–34.0)
MCHC: 33.8 g/dL (ref 30.0–36.0)
MCV: 92.1 fL (ref 80.0–100.0)
Platelets: 232 10*3/uL (ref 150–400)
RBC: 5.04 MIL/uL (ref 4.22–5.81)
RDW: 11.9 % (ref 11.5–15.5)
WBC: 9.4 10*3/uL (ref 4.0–10.5)
nRBC: 0 % (ref 0.0–0.2)

## 2022-07-08 LAB — TSH: TSH: 1.493 u[IU]/mL (ref 0.350–4.500)

## 2022-07-08 MED ORDER — ALBUTEROL SULFATE HFA 108 (90 BASE) MCG/ACT IN AERS: 2.0000 | INHALATION_SPRAY | RESPIRATORY_TRACT | Status: DC | PRN

## 2022-07-08 MED ORDER — FLUTICASONE PROPIONATE 50 MCG/ACT NA SUSP: 2.0000 | Freq: Two times a day (BID) | NASAL | Status: DC | PRN

## 2022-07-08 MED ORDER — AMLODIPINE BESYLATE 2.5 MG PO TABS
2.5000 mg | ORAL_TABLET | Freq: Every day | ORAL | Status: DC
  Administered 2022-07-08 – 2022-07-10 (×3): 2.5 mg via ORAL
  Filled 2022-07-08 (×5): qty 1

## 2022-07-08 MED ORDER — LORAZEPAM 2 MG/ML IJ SOLN: 1.0000 mg | Freq: Four times a day (QID) | INTRAMUSCULAR | Status: DC | PRN

## 2022-07-08 MED ORDER — OMEGA-3-ACID ETHYL ESTERS 1 G PO CAPS
1.0000 g | ORAL_CAPSULE | Freq: Every day | ORAL | Status: DC
  Administered 2022-07-08 – 2022-07-11 (×4): 1 g via ORAL
  Filled 2022-07-08 (×6): qty 1

## 2022-07-08 MED ORDER — QUETIAPINE FUMARATE ER 200 MG PO TB24
400.0000 mg | ORAL_TABLET | Freq: Two times a day (BID) | ORAL | Status: DC
  Administered 2022-07-08 (×2): 400 mg via ORAL
  Filled 2022-07-08 (×6): qty 2

## 2022-07-08 MED ORDER — LISINOPRIL 40 MG PO TABS
40.0000 mg | ORAL_TABLET | Freq: Every day | ORAL | Status: DC
  Administered 2022-07-08 – 2022-07-11 (×4): 40 mg via ORAL
  Filled 2022-07-08 (×5): qty 1

## 2022-07-08 MED ORDER — DIPHENHYDRAMINE HCL 25 MG PO CAPS
25.0000 mg | ORAL_CAPSULE | Freq: Four times a day (QID) | ORAL | Status: DC | PRN
  Administered 2022-07-09 – 2022-07-10 (×2): 25 mg via ORAL
  Filled 2022-07-08 (×2): qty 1

## 2022-07-08 MED ORDER — VITAMIN D3 25 MCG PO TABS
1000.0000 [IU] | ORAL_TABLET | Freq: Every day | ORAL | Status: DC
  Administered 2022-07-08 – 2022-07-11 (×4): 1000 [IU] via ORAL
  Filled 2022-07-08 (×5): qty 1

## 2022-07-08 MED ORDER — LORAZEPAM 1 MG PO TABS
1.0000 mg | ORAL_TABLET | Freq: Once | ORAL | Status: AC
  Administered 2022-07-08: 1 mg via ORAL
  Filled 2022-07-08: qty 1

## 2022-07-08 MED ORDER — DIPHENHYDRAMINE HCL 50 MG/ML IJ SOLN: 25.0000 mg | Freq: Four times a day (QID) | INTRAMUSCULAR | Status: DC | PRN

## 2022-07-08 MED ORDER — LORAZEPAM 1 MG PO TABS: 1.0000 mg | ORAL_TABLET | Freq: Four times a day (QID) | ORAL | Status: DC | PRN

## 2022-07-08 NOTE — Progress Notes (Signed)
Patient is complaining of poor sleep last evening due to his roommate's excessive snoring.

## 2022-07-08 NOTE — [facility] Suicide Risk Assessment (Signed)
Suicide Risk Assessment  Admission Assessment    [facility] Admission Suicide Risk Assessment   Nursing information obtained from:  Patient Demographic factors:  Male, Caucasian Current Mental Status:  Suicidal ideation indicated by patient, Suicide plan, Self-harm thoughts Loss Factors:  Decline in physical health Historical Factors:  NA Risk Reduction Factors:  Living with another person, especially a relative  Total Time spent with patient: 1.5 hours Principal Problem: Bipolar 2 disorder, major depressive episode ([place]) Diagnosis:  Principal Problem:   Bipolar 2 disorder, major depressive episode ([place]) Active Problems:   Hyperlipidemia   Hypertension, essential, benign   Insomnia   GAD (generalized anxiety disorder)  Reason For Admission: Nobuo P. [name] Is a 61 yo Caucasian male with past mental health diagnoses of Bipolar 2 d/o & GAD who presented to this [facility] on 07/07/22 with complaints of worsening depressive symptoms and +SI with a plan to hang himself after a verbal altercation with his wife. Pt was admitted voluntarily for treatment and stabilization of his mood.   Continued Clinical Symptoms: Mr Meng reports worsening feelings of hopelessness, helplessness, worthlessness, anhedonia, anxiety, guilt, as well as feeling overwhelmed, scared, irritable, having decreased motivation, poor appetite and low energy levels for at least the past two weeks. Pt also reports frequent panic type symptoms and intrusive thoughts that something bad will happen to him and his family members.  Recent suicidal thoughts with plans to hang himself along with symptoms listed above and health and other stressors place patient at the moderate risks of a suicide.  Continuous hospitalization necessary to treat and stabilize mental status.   The "Alcohol Use Disorders Identification Test", Guidelines for Use in Primary Care, Second Edition.  World Pharmacologist [facility]). Score between 0-7:  no or low  risk or alcohol related problems. Score between 8-15:  moderate risk of alcohol related problems. Score between 16-19:  high risk of alcohol related problems. Score 20 or above:  warrants further diagnostic evaluation for alcohol dependence and treatment.  CLINICAL FACTORS:   Bipolar Disorder:   Depressive phase  Musculoskeletal: Strength & Muscle Tone: within normal limits Gait & Station: normal Patient leans: N/A  Psychiatric Specialty Exam:  Presentation  General Appearance:  Appropriate for Environment; Fairly Groomed  Eye Contact: Good  Speech: Clear and Coherent  Speech Volume: Normal  Handedness: Right  Mood and Affect  Mood: Depressed; Anxious  Affect: Congruent  Thought Process  Thought Processes: Coherent  Descriptions of Associations:Intact  Orientation:Full (Time, Place and Person)  Thought Content:Logical  History of Schizophrenia/Schizoaffective disorder:No data recorded Duration of Psychotic Symptoms:No data recorded Hallucinations:Hallucinations: None  Ideas of Reference:None  Suicidal Thoughts:Suicidal Thoughts: No SI Active Intent and/or Plan: With Intent; With Plan; With Means to Carry Out  Homicidal Thoughts:Homicidal Thoughts: No  Sensorium  Memory: Immediate Good  Judgment: Fair  Insight: Fair  Community education officer  Concentration: Fair  Attention Span: Good  Recall: Poor  Fund of Knowledge: Good  Language: Fair  Psychomotor Activity  Psychomotor Activity: Psychomotor Activity: Normal  Assets  Assets: Armed forces logistics/support/administrative officer; Housing; Social Support  Sleep  Sleep: Sleep: Fair Number of Hours of Sleep: 12   Physical Exam: Physical Exam Constitutional:      Appearance: Normal appearance.  HENT:     Head: Normocephalic.     Nose: Nose normal. No congestion or rhinorrhea.  Eyes:     Pupils: Pupils are equal, round, and reactive to light.  Musculoskeletal:        General: Normal range of  motion.      Cervical back: Normal range of motion.  Neurological:     Mental Status: He is alert and oriented to person, place, and time.  Psychiatric:        Behavior: Behavior normal.    Review of Systems  Constitutional:  Negative for fever.  HENT: Negative.  Negative for sore throat.   Eyes: Negative.   Respiratory:  Negative for cough.   Cardiovascular:  Negative for chest pain.  Gastrointestinal:  Negative for heartburn.  Genitourinary:  Negative for dysuria.  Musculoskeletal:  Negative for myalgias.  Skin: Negative.  Negative for rash.  Neurological: Negative.  Negative for dizziness.  Psychiatric/Behavioral:  Positive for depression and substance abuse. Negative for hallucinations, memory loss and suicidal ideas. The patient is nervous/anxious and has insomnia.    Blood pressure (!) 133/98, pulse 95, temperature 98.7 F (37.1 C), temperature source Oral, resp. rate 20, height 5' 9.5" (1.765 m), weight 79.4 kg, SpO2 98 %. Body mass index is 25.47 kg/m.  COGNITIVE FEATURES THAT CONTRIBUTE TO RISK:  None    SUICIDE RISK:   Moderate:  Frequent suicidal ideation prior to hospitalization, with limited intensity, and duration, some specificity in terms of plan, good self-control, limited dysphoria/symptomatology, some risk factors present, and identifiable protective factors, including available and accessible social support.  Treatment Plan Summary: Daily contact with patient to assess and evaluate symptoms and progress in treatment and Medication management   Observation Level/Precautions:  15 minute checks  Laboratory:  Labs reviewed   Psychotherapy:  Unit Group sessions  Medications:  See [facility]  Consultations:  To be determined   Discharge Concerns:  Safety, medication compliance, mood stability  Estimated LOS: 5-7 days  Other:  N/A    Labs reviewed on 07/08/2022: CMP within normal limits, lipid panel with cholesterol 292, HDL 39, LDL- 231, patient educated on healthy food  choices and exercise, agreeable to following this up with his cardiologist after discharge for a possible order of antilipid medications.  CBC WNL, hemoglobin A1c WNL, TSH WNL.  Will order repeat EKG, vitamin D, vitamin B-12.  Baseline UA ordered.  Urine tox screen ordered.  MoCA testing ordered due to concerns of memory loss.   PLAN Safety and Monitoring: Voluntary admission to inpatient psychiatric unit for safety, stabilization and treatment Daily contact with patient to assess and evaluate symptoms and progress in treatment Patient's case to be discussed in multi-disciplinary team meeting Observation Level : q15 minute checks Vital signs: q12 hours Precautions: Safety   Long Term Goal(s): Improvement in symptoms so as ready for discharge   Short Term Goals: Ability to identify changes in lifestyle to reduce recurrence of condition will improve, Ability to disclose and discuss suicidal ideas, Ability to demonstrate self-control will improve, Ability to identify and develop effective coping behaviors will improve, Compliance with prescribed medications will improve, and Ability to identify triggers associated with substance abuse/mental health issues will improve   Diagnoses  Principal Problem:   Bipolar 2 disorder, major depressive episode ([place]) Active Problems:   Hyperlipidemia   Hypertension, essential, benign   Insomnia   GAD (generalized anxiety disorder)    Medications -Change a.m. Seroquel to 200 mg immediate release in the morning and afternoon for mood stabilization/anxiety (EKG from 10/25 with QTc of 402 and nonspecific T wave abnormalities.  Will repeat EKG) -Continue p.m. Seroquel 400 mg XL at bedtime for mood stabilization (home med) -Continue Hydroxyzine 25 mg every 6 hours PRN for anxiety -Continue lisinopril 40 mg  daily (home med) -Continue omega-3 daily for hyperlipidemia-educated on the need to follow-up with cardiologist after this hospitalization due to elevated  lipids -Continue vitamin D3 1000 mg daily -Continue Norvasc 2.5 mg daily for hypertension (home med) -Start albuterol inhaler every 6 hours as needed for SOB/wheezing -Start Flonase daily as needed for seasonal allergic rhinitis -Stat agitation protocol as needed Benadryl/Ativan as needed-See Mar   Other PRNS -Continue Tylenol 650 mg every 6 hours PRN for mild pain -Continue Maalox 30 mg every 4 hrs PRN for indigestion -Continue Milk of Magnesia as needed every 6 hrs for constipation   Discharge Planning: Social work and case management to assist with discharge planning and identification of hospital follow-up needs prior to discharge Estimated LOS: 5-7 days Discharge Concerns: Need to establish a safety plan; Medication compliance and effectiveness Discharge Goals: Return home with outpatient referrals for mental health follow-up including medication management/psychotherapy  I certify that inpatient services furnished can reasonably be expected to improve the patient's condition.   Nicholes Rough, NP 07/08/2022, 2:37 PM

## 2022-07-08 NOTE — Progress Notes (Signed)
EKG results placed on the outside of patient's shadow chart  Sinus tachycardia Otherwise normal ECG  Vent. Rate 106 bpm
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
## 2022-07-08 NOTE — H&P (Signed)
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 for mental health follow-up including medication management/psychotherapy  I certify that inpatient services furnished can reasonably be expected to improve the patient's condition.    Nicholes Rough, NP 11/7/20232:29 PM

## 2022-07-08 NOTE — Progress Notes (Signed)
Adult Psychoeducational Group Note  Date:  07/08/2022 Time:  1:19 PM  Group Topic/Focus:  Orientation:   The focus of this group is to educate the patient on the purpose and policies of crisis stabilization and provide a format to answer questions about their admission.  The group details unit policies and expectations of patients while admitted.  Participation Level:  Active  Participation Quality:  Appropriate  Affect:  Appropriate  Cognitive:  Appropriate  Insight: Appropriate  Engagement in Group:  Engaged and Improving  Modes of Intervention:  Discussion  Additional Comments:  Pt attended the orientation group and remained appropriate and engaged throughout the duration of the group.   Sandi Mariscal O 07/08/2022, 1:19 PM

## 2022-07-08 NOTE — Progress Notes (Signed)
Pt rates depression 0/10 and anxiety 8/10. Pt reports some agitation and requested to restart his morning med, morning meds were given. Pt reports a good appetite, and no physical problems. Pt currently denies SI/HI/AVH and verbally contracts for safety. Provided support and encouragement. Pt safe on the unit. Q 15 minute safety checks continued.

## 2022-07-08 NOTE — Progress Notes (Signed)
Adult Psychoeducational Group Note  Date:  07/08/2022 Time:  8:33 PM  Group Topic/Focus:  Wrap-Up Group:   The focus of this group is to help patients review their daily goal of treatment and discuss progress on daily workbooks.  Participation Level:  Active  Participation Quality:  Appropriate  Affect:  Appropriate  Cognitive:  Appropriate  Insight: Appropriate  Engagement in Group:  Engaged  Modes of Intervention:  Discussion  Additional Comments:  Dan attend wrap up group. His day was a 5. His goal get involve activities and environmentl Coping skills patience and kindness to self  Barbette Hair 07/08/2022, 8:33 PM

## 2022-07-09 ENCOUNTER — Encounter (HOSPITAL_COMMUNITY): Payer: Self-pay

## 2022-07-09 LAB — VITAMIN D 25 HYDROXY (VIT D DEFICIENCY, FRACTURES): Vit D, 25-Hydroxy: 25.37 ng/mL — ABNORMAL LOW (ref 30–100)

## 2022-07-09 LAB — VITAMIN B12: Vitamin B-12: 528 pg/mL (ref 180–914)

## 2022-07-09 MED ORDER — QUETIAPINE FUMARATE ER 400 MG PO TB24
400.0000 mg | ORAL_TABLET | Freq: Every day | ORAL | Status: DC
  Administered 2022-07-09 – 2022-07-10 (×2): 400 mg via ORAL
  Filled 2022-07-09 (×3): qty 1

## 2022-07-09 MED ORDER — QUETIAPINE FUMARATE 200 MG PO TABS
200.0000 mg | ORAL_TABLET | Freq: Two times a day (BID) | ORAL | Status: DC
  Administered 2022-07-09 – 2022-07-11 (×5): 200 mg via ORAL
  Filled 2022-07-09 (×7): qty 1

## 2022-07-09 MED ORDER — VITAMIN D (ERGOCALCIFEROL) 1.25 MG (50000 UNIT) PO CAPS
50000.0000 [IU] | ORAL_CAPSULE | ORAL | Status: DC
  Administered 2022-07-09: 50000 [IU] via ORAL
  Filled 2022-07-09 (×2): qty 1

## 2022-07-09 NOTE — Plan of Care (Signed)
Nurse discussed anxiety, depression and coping skills with patient.  

## 2022-07-09 NOTE — Progress Notes (Signed)
D:  Patient's self inventory sheet, patient sleeps good, sleep medication helpful.  Good appetite, normal energy level, good concentration.  Denied depression, hopeless and anxiety.  Denied withdrawals.  Denied SI.  Denied physical problems.  Denied physical pain.  Goal is continuing yesterday's progress solidifying my goals going forward.  Continue forming and writing down goals/steps.  Looking forward to speaking to N.P. Doris today.  Would like to have discharge plans. A:  Medications administered per MD orders.  Emotional support and encouragement given patient. R:  Denied SI and HI, contracts for safety.  Denied A/V hallucinations.  Safety maintained with 15 minute checks.

## 2022-07-09 NOTE — Progress Notes (Signed)
D: Pt alert and oriented. Pt rates depression 0/10 and anxiety 3/10.  Pt denies experiencing any SI/HI, or AVH at this time.   A: Scheduled medications administered to pt, per MD orders. Support and encouragement provided. Frequent verbal contact made. Routine safety checks conducted q15 minutes.   R: No adverse drug reactions noted. Pt verbally contracts for safety at this time. Pt complaint with medications and treatment plan. Pt interacts well with others on the unit. Pt remains safe at this time. Will continue to monitor.

## 2022-07-09 NOTE — Group Note (Signed)
Recreation Therapy Group Note   Group Topic:Team Building  Group Date: 07/09/2022 Start Time: 0930 End Time: 0955 Facilitators: Adrionna Delcid-McCall, LRT,CTRS Location: [address] Dayroom   Goal Area(s) Addresses:  Patient will effectively work with peer towards shared goal.  Patient will identify skills used to make activity successful.  Patient will identify how skills used during activity can be used to reach post d/c goals.   Group Description: Landing Pad. In teams of 3-5, patients were given 12 plastic drinking straws and an equal length of masking tape. Using the materials provided, patients were asked to build a landing pad to catch a golf ball dropped from approximately 5 feet in the air. All materials were required to be used by the team in their design. LRT facilitated post-activity discussion.   Affect/Mood: Appropriate   Participation Level: Engaged   Participation Quality: Independent   Behavior: Appropriate   Speech/Thought Process: Focused   Insight: Good   Judgement: Good   Modes of Intervention: STEM Activity   Patient Response to Interventions:  Engaged   Education Outcome:  Acknowledges education and In group clarification offered    Clinical Observations/Individualized Feedback: Pt was engaged and worked well with peers.  Pt was attentive to the suggestions of his peers and made suggestions of his own.     Plan: Continue to engage patient in RT group sessions 2-3x/week.   Sapna Padron-McCall, LRT,CTRS 07/09/2022 11:00 AM

## 2022-07-09 NOTE — BH IP Treatment Plan (Signed)
Interdisciplinary Treatment and Diagnostic Plan Update  07/09/2022 Time of Session: [place]. MRN: [id number]  Principal Diagnosis: Bipolar 2 disorder, major depressive episode ([place])  Secondary Diagnoses: Principal Problem:   Bipolar 2 disorder, major depressive episode ([place]) Active Problems:   Hyperlipidemia   Hypertension, essential, benign   Insomnia   GAD (generalized anxiety disorder)   Current Medications:  Current Facility-Administered Medications  Medication Dose Route Frequency Provider Last Rate Last Admin   acetaminophen (TYLENOL) tablet 650 mg  650 mg Oral Q6H PRN Ntuen, Kris Hartmann, FNP   650 mg at 07/09/22 1359   albuterol (VENTOLIN HFA) 108 (90 Base) MCG/ACT inhaler 2 puff  2 puff Inhalation Q4H PRN Nkwenti, Tamela Oddi, NP       alum & mag hydroxide-simeth (MAALOX/MYLANTA) 200-200-20 MG/5ML suspension 30 mL  30 mL Oral Q4H PRN Ntuen, Kris Hartmann, FNP       amLODipine (NORVASC) tablet 2.5 mg  2.5 mg Oral Daily Nicholes Rough, NP   2.5 mg at 07/09/22 0746   diphenhydrAMINE (BENADRYL) capsule 25 mg  25 mg Oral Q6H PRN Nicholes Rough, NP       Or   diphenhydrAMINE (BENADRYL) injection 25 mg  25 mg Intramuscular Q6H PRN Nkwenti, Doris, NP       fluticasone (FLONASE) 50 MCG/ACT nasal spray 2 spray  2 spray Each Nare BID PRN Nicholes Rough, NP       hydrOXYzine (ATARAX) tablet 25 mg  25 mg Oral TID PRN Laretta Bolster, FNP   25 mg at 07/09/22 0747   lisinopril (ZESTRIL) tablet 40 mg  40 mg Oral Daily Nicholes Rough, NP   40 mg at 07/09/22 0745   LORazepam (ATIVAN) tablet 1 mg  1 mg Oral Q6H PRN Nicholes Rough, NP       Or   LORazepam (ATIVAN) injection 1 mg  1 mg Intramuscular Q6H PRN Nkwenti, Doris, NP       magnesium hydroxide (MILK OF MAGNESIA) suspension 30 mL  30 mL Oral Daily PRN Ntuen, Kris Hartmann, FNP       omega-3 acid ethyl esters (LOVAZA) capsule 1 g  1 g Oral Daily Nkwenti, Doris, NP   1 g at 07/09/22 0744   QUEtiapine (SEROQUEL XR) 24 hr tablet 400 mg  400 mg Oral  QHS Nkwenti, Doris, NP       QUEtiapine (SEROQUEL) tablet 200 mg  200 mg Oral BID Nicholes Rough, NP   200 mg at 07/09/22 1358   Vitamin D (Ergocalciferol) (DRISDOL) 1.25 MG (50000 UNIT) capsule 50,000 Units  50,000 Units Oral Q7 days Nicholes Rough, NP       vitamin D3 (CHOLECALCIFEROL) tablet 1,000 Units  1,000 Units Oral Daily Nicholes Rough, NP   1,000 Units at 07/09/22 0745   PTA Medications: Medications Prior to Admission  Medication Sig Dispense Refill Last Dose   albuterol (PROVENTIL) (2.5 MG/3ML) 0.083% nebulizer solution Take 2.5 mg by nebulization every 6 (six) hours as needed for wheezing or shortness of breath.       albuterol (VENTOLIN HFA) 108 (90 Base) MCG/ACT inhaler Inhale 2 puffs into the lungs every 6 (six) hours as needed for wheezing or shortness of breath. 18 g 1    amLODipine (NORVASC) 5 MG tablet Take 0.5 tablets (2.5 mg total) by mouth at bedtime. 30 tablet 1 07/07/2022   Cholecalciferol (VITAMIN D3) 125 MCG (5000 UT) TABS Take 5,000 Units by mouth daily.    07/07/2022   clonazePAM (KLONOPIN) 0.5 MG tablet  Take 1/2-1 tablet BY MOUTH EVERY DAY AS NEEDED FOR anxiety AND panic. 30 tablet 2 07/07/2022   fluticasone (FLONASE) 50 MCG/ACT nasal spray Place into both nostrils daily as needed.      fluticasone-salmeterol (ADVAIR) 250-50 MCG/ACT AEPB Inhale 1 puff into the lungs as needed.      lisinopril (ZESTRIL) 40 MG tablet Take 1 tablet (40 mg total) by mouth daily. 90 tablet 2 07/07/2022   Omega-3 Fatty Acids (FISH OIL) 1200 MG CAPS Take 1,200 mg by mouth daily.    07/07/2022   QUEtiapine (SEROQUEL XR) 400 MG 24 hr tablet Take 1 tablet (400 mg total) by mouth 2 (two) times daily. 90 tablet 0 07/07/2022   testosterone cypionate (DEPOTESTOSTERONE CYPIONATE) 200 MG/ML injection Inject 0.5 mLs (100 mg total) into the muscle once a week. 2.5 mL 3 07/04/2022   INSULIN SYRINGE 1CC/29G (EXEL COMFORT POINT INSULIN SYR) 29G X 1/2" 1 ML MISC Inject testosterone 0.5 ml weekly 100 each 2     L-Methylfolate-Algae (DEPLIN 15) 15-90.314 MG CAPS Take 15 mg by mouth daily. 90 capsule 3     Patient Stressors: Health problems   Marital or family conflict    Patient Strengths: Ability for insight  Average or above average intelligence  Communication skills  Motivation for treatment/growth  Supportive family/friends   Treatment Modalities: Medication Management, Group therapy, Case management,  1 to 1 session with clinician, Psychoeducation, Recreational therapy.   Physician Treatment Plan for Primary Diagnosis: Bipolar 2 disorder, major depressive episode ([place]) Long Term Goal(s): Improvement in symptoms so as ready for discharge   Short Term Goals: Ability to identify changes in lifestyle to reduce recurrence of condition will improve Ability to disclose and discuss suicidal ideas Ability to demonstrate self-control will improve Ability to identify and develop effective coping behaviors will improve Compliance with prescribed medications will improve Ability to identify triggers associated with substance abuse/mental health issues will improve  Medication Management: Evaluate patient's response, side effects, and tolerance of medication regimen.  Therapeutic Interventions: 1 to 1 sessions, Unit Group sessions and Medication administration.  Evaluation of Outcomes: Progressing  Physician Treatment Plan for Secondary Diagnosis: Principal Problem:   Bipolar 2 disorder, major depressive episode ([place]) Active Problems:   Hyperlipidemia   Hypertension, essential, benign   Insomnia   GAD (generalized anxiety disorder)  Long Term Goal(s): Improvement in symptoms so as ready for discharge   Short Term Goals: Ability to identify changes in lifestyle to reduce recurrence of condition will improve Ability to disclose and discuss suicidal ideas Ability to demonstrate self-control will improve Ability to identify and develop effective coping behaviors will improve Compliance with  prescribed medications will improve Ability to identify triggers associated with substance abuse/mental health issues will improve     Medication Management: Evaluate patient's response, side effects, and tolerance of medication regimen.  Therapeutic Interventions: 1 to 1 sessions, Unit Group sessions and Medication administration.  Evaluation of Outcomes: Progressing   RN Treatment Plan for Primary Diagnosis: Bipolar 2 disorder, major depressive episode ([place]) Long Term Goal(s): Knowledge of disease and therapeutic regimen to maintain health will improve  Short Term Goals: Ability to remain free from injury will improve, Ability to verbalize frustration and anger appropriately will improve, Ability to demonstrate self-control, Ability to participate in decision making will improve, Ability to verbalize feelings will improve, Ability to disclose and discuss suicidal ideas, Ability to identify and develop effective coping behaviors will improve, and Compliance with prescribed medications will improve  Medication Management: RN  will administer medications as ordered by provider, will assess and evaluate patient's response and provide education to patient for prescribed medication. RN will report any adverse and/or side effects to prescribing provider.  Therapeutic Interventions: 1 on 1 counseling sessions, Psychoeducation, Medication administration, Evaluate responses to treatment, Monitor vital signs and CBGs as ordered, Perform/monitor CIWA, COWS, AIMS and Fall Risk screenings as ordered, Perform wound care treatments as ordered.  Evaluation of Outcomes: Progressing   LCSW Treatment Plan for Primary Diagnosis: Bipolar 2 disorder, major depressive episode ([place]) Long Term Goal(s): Safe transition to appropriate next level of care at discharge, Engage patient in therapeutic group addressing interpersonal concerns.  Short Term Goals: Engage patient in aftercare planning with referrals and  resources, Increase social support, Increase ability to appropriately verbalize feelings, Increase emotional regulation, Facilitate acceptance of mental health diagnosis and concerns, Facilitate patient progression through stages of change regarding substance use diagnoses and concerns, Identify triggers associated with mental health/substance abuse issues, and Increase skills for wellness and recovery  Therapeutic Interventions: Assess for all discharge needs, 1 to 1 time with Social worker, Explore available resources and support systems, Assess for adequacy in community support network, Educate family and significant other(s) on suicide prevention, Complete Psychosocial Assessment, Interpersonal group therapy.  Evaluation of Outcomes: Progressing   Progress in Treatment: Attending groups: Yes. Participating in groups: Yes. Taking medication as prescribed: Yes. Toleration medication: Yes. Family/Significant other contact made: No, will contact:  csw will obtain consent to reach collateral  Patient understands diagnosis: Yes. Discussing patient identified problems/goals with staff: Yes. Medical problems stabilized or resolved: Yes. Denies suicidal/homicidal ideation: No. Issues/concerns per patient self-inventory: Yes. Other: none  New problem(s) identified: No, Describe:  none  New Short Term/Long Term Goal(s): Patient to work towards detox,medication management for mood stabilization; elimination of SI thoughts; development of comprehensive mental wellness/sobriety plan.  Patient Goals:  Patient states their goal for treatment is to "stop being harsh to myself and maintain self worth."  Discharge Plan or Barriers: No psychosocial barriers identified at this time, patient to return to place of residence when appropriate for discharge.   Reason for Continuation of Hospitalization: Depression Medication stabilization  Estimated Length of Stay:  Last 3 [place] Suicide Severity Risk  Score: [place] Admission (Current) from OP Visit from 07/07/2022 in [facility] ED from 08/14/2021 in [facility] from 06/14/2021 in [facility] at [place] High Risk No Risk Error: Q3, 4, or 5 should not be populated when Q2 is No       Last PHQ 2/9 Scores:    08/16/2021    7:31 AM 06/14/2021    8:44 AM 06/14/2021    8:32 AM  Depression screen PHQ 2/9  Decreased Interest '3 1 1  '$ Down, Depressed, Hopeless '2 1 1  '$ PHQ - 2 Score '5 2 2  '$ Altered sleeping 3  1  Tired, decreased energy 2  1  Change in appetite 2  0  Feeling bad or failure about yourself  2  1  Trouble concentrating 3  3  Moving slowly or fidgety/restless 2  3  Suicidal thoughts 1  1  PHQ-9 Score 20  12  Difficult doing work/chores Very difficult Extremely dIfficult Extremely dIfficult    Scribe for Treatment Team: Durenda Hurt, Latanya Presser 07/09/2022 4:00 PM

## 2022-07-09 NOTE — BHH Group Notes (Signed)
Adult Psychoeducational Group Note  Date:  07/09/2022 Time:  2:34 PM  Group Topic/Focus:  Wellness Toolbox:   The focus of this group is to discuss various aspects of wellness, balancing those aspects and exploring ways to increase the ability to experience wellness.  Patients will create a wellness toolbox for use upon discharge.  Participation Level:  Active  Participation Quality:  Attentive  Affect:  Appropriate  Cognitive:  Appropriate  Insight: Appropriate  Engagement in Group:  Engaged  Modes of Intervention:  Activity  Additional Comments:  Patient attended and participated in the therapeutic group activity.  Annie Sable 07/09/2022, 2:34 PM

## 2022-07-09 NOTE — Group Note (Signed)
LCSW Group Therapy Note  Group Date: 07/09/2022 Start Time: 1300 End Time: 1400   Type of Therapy and Topic:  Group Therapy - Healthy vs Unhealthy Coping Skills  Participation Level:  Minimal   Description of Group The focus of this group was to determine what unhealthy coping techniques typically are used by group members and what healthy coping techniques would be helpful in coping with various problems. Patients were guided in becoming aware of the differences between healthy and unhealthy coping techniques. Patients were asked to identify 2-3 healthy coping skills they would like to learn to use more effectively.  Therapeutic Goals Patients learned that coping is what human beings do all day long to deal with various situations in their lives Patients defined and discussed healthy vs unhealthy coping techniques Patients identified their preferred coping techniques and identified whether these were healthy or unhealthy Patients determined 2-3 healthy coping skills they would like to become more familiar with and use more often. Patients provided support and ideas to each other   Summary of Patient Progress:  During group, Chase Hunter participated with introducing himself. Patient did answer CSW questions regarding topic when calling on him. Patient demonstrated fair insight into the subject matter, was respectful of peers, and participated throughout the entire session.   Therapeutic Modalities Cognitive Behavioral Therapy Motivational Interviewing  Sherre Lain, [place] 07/09/2022  2:02 PM

## 2022-07-09 NOTE — Progress Notes (Signed)
Psychoeducational Group Note  Date:  07/09/2022 Time:  2116  Group Topic/Focus:  Wrap-Up Group:   The focus of this group is to help patients review their daily goal of treatment and discuss progress on daily workbooks.  Participation Level: Did Not Attend  Participation Quality:  Not Applicable  Affect:  Not Applicable  Cognitive:  Not Applicable  Insight:  Not Applicable  Engagement in Group: Not Applicable  Additional Comments:  The patient did not attend group this evening.   Archie Balboa S 07/09/2022, 9:16 PM

## 2022-07-09 NOTE — Progress Notes (Addendum)
[facility] MD Progress Note  07/09/2022 3:32 PM [place].  MRN:  [id number] Principal Problem: Bipolar 2 disorder, major depressive episode ([place]) Diagnosis: Principal Problem:   Bipolar 2 disorder, major depressive episode ([place]) Active Problems:   Hyperlipidemia   Hypertension, essential, benign   Insomnia   GAD (generalized anxiety disorder)  Reason For Admission: Chase Hunter. Is a 61 yo Caucasian male with past mental health diagnoses of Bipolar 2 d/o & GAD who presented to this [facility] on 07/07/22 with complaints of worsening depressive symptoms and +SI with a plan to hang himself after a verbal altercation with his wife. Pt was admitted voluntarily for treatment and stabilization of his mood.    24 hour chart review: V/S for the past 24 hours mostly WNL.  Patient has taken all medications as ordered in the past 24 hours.  As needed medications given in the past 24 hours as follows; hydroxyzine 25 mg earlier today morning and Tylenol 650 mg today afternoon.  Patient has been cooperative and has attended most unit group sessions in the past 24 hours.  There have been no behavioral episodes noted in the past 24 hours.  Patient assessment note (07/09/2022): Mood today is less depressed, patient appears less anxious, denies suicidal ideations, denies homicidal ideations, denies AVH, denies paranoia and there is no evidence of delusional thinking.  Patient reports that his depressive symptoms are resolving, and reports that current medications are helpful. His attention to personal hygiene and grooming is fair, eye contact is good, speech is clear & coherent. Thought contents are organized and logical.  Patient reports that his sleep quality last night was good, he reports a good appetite, denied being in any physical pain at the time of assessment, denied having any medication side effects.  He showed Probation officer a document called "wellness recovery action plan", and stated that he was  working on his action plan in case he experiences symptoms that he experienced prior to this hospitalization.  Patient also showed writer his completed suicide safety plan.  He is currently future oriented, reports that his wife visited last night, and they talked about what happened that led to this hospitalization, and they have a plan in place to avoid future occurrences.  Pt talked about antidepressants being started as he was told on admission, but has been educated that there is no current indication for antipsychotics since he is showing improvement in his depressive symptoms, and is reporting current, depression as 1 (10 being worst). Pt educated that all meds will be continued as listed below.  Patient reports that he took off his portable cardiac monitor, and will give it to his wife tonight when she comes to visit.  He reports that this was placed to catch his cardiac rhythms for 1 week.  He states that his cardiologist had told him that he can take it off after a week.  Labs reviewed on 07/09/2022: CMP within normal limits, lipid panel with cholesterol 292, HDL 39, LDL- 231, patient educated on healthy food choices and exercise, agreeable to following this up with his cardiologist after discharge for a possible order of antilipid medications.  CBC WNL, hemoglobin A1c WNL, TSH WNL. EKG with Sinus tach and Qtc 430. Vitamin D slightly low at 25.37. Will supplement with Vitamin D 50,000 units weekly. Vitamin B-12 WNL.  Baseline UA with glucose and ketones, but otherwise WNL .  Urine tox screen + Benzos and +THC. [facility] At[facility] at [facility] prior to urine  collection.  MoCA testing ordered due to concerns of memory loss. OT on unit to complete testing, but pt outside for recreational activities. OT states will be back tomorrow morning to complete testing.   Total Time spent with patient: 30 minutes  Past Psychiatric History: MDD and GAD  Past Medical History:  Past Medical History:  Diagnosis Date    Allergy    Anemia    Asthma    Bipolar II disorder ([place])    Depression    Gallstones    GERD (gastroesophageal reflux disease)    Hx of adenomatous colonic polyps    Hyperlipidemia    Hypertension    Pancreatitis    Seizures ([place])    past hx 6 yrs ago x 1- coming off meds caused 1 seizure     Past Surgical History:  Procedure Laterality Date   COLONOSCOPY     ESOPHAGOGASTRODUODENOSCOPY N/A 07/19/2020   Procedure: ESOPHAGOGASTRODUODENOSCOPY (EGD);  Surgeon: Milus Banister, MD;  Location: [facility];  Service: Endoscopy;  Laterality: N/A;   EUS N/A 07/19/2020   Procedure: UPPER ENDOSCOPIC ULTRASOUND (EUS) RADIAL;  Surgeon: Milus Banister, MD;  Location: [facility];  Service: Endoscopy;  Laterality: N/A;   POLYPECTOMY     Family History:  Family History  Problem Relation Age of Onset   Atrial fibrillation Mother    Anxiety disorder Mother    Obesity Mother    Mood Disorder Mother    Anxiety disorder Father    Prostate cancer Father    Brain cancer Father    Anxiety disorder Brother    Anxiety disorder Daughter    Colon cancer Neg Hx    Colon polyps Neg Hx    Esophageal cancer Neg Hx    Rectal cancer Neg Hx    Stomach cancer Neg Hx    Family Psychiatric  History: See H & P Social History:  Social History   Substance and Sexual Activity  Alcohol Use Never     Social History   Substance and Sexual Activity  Drug Use Not Currently    Social History   Socioeconomic History   Marital status: Married    Spouse name: Not on file   Number of children: 1   Years of education: Not on file   Highest education level: Not on file  Occupational History   Occupation: retired Pharmacist, hospital  Tobacco Use   Smoking status: Never   Smokeless tobacco: Never  Substance and Sexual Activity   Alcohol use: Never   Drug use: Not Currently   Sexual activity: Not on file  Other Topics Concern   Not on file  Social History Narrative   Not on file   Social Determinants of  Health   Financial Resource Strain: Low Risk  (06/14/2021)   Overall Financial Resource Strain (CARDIA)    Difficulty of Paying Living Expenses: Not hard at all  Food Insecurity: No Food Insecurity (07/07/2022)   Hunger Vital Sign    Worried About Running Out of Food in the Last Year: Never true    Ran Out of Food in the Last Year: Never true  Transportation Needs: No Transportation Needs (07/07/2022)   PRAPARE - Hydrologist (Medical): No    Lack of Transportation (Non-Medical): No  Physical Activity: Insufficiently Active (06/14/2021)   Exercise Vital Sign    Days of Exercise per Week: 3 days    Minutes of Exercise per Session: 40 min  Stress: Stress Concern Present (06/14/2021)  [employer] - Occupational Stress Questionnaire    Feeling of Stress : Very much  Social Connections: Moderately Integrated (06/14/2021)   Social Connection and Isolation Panel [NHANES]    Frequency of Communication with Friends and Family: Three times a week    Frequency of Social Gatherings with Friends and Family: Three times a week    Attends Religious Services: Never    Active Member of Clubs or Organizations: Yes    Attends Music therapist: More than 4 times per year    Marital Status: Married   Sleep: Good  Appetite:  Fair  Current Medications: Current Facility-Administered Medications  Medication Dose Route Frequency Provider Last Rate Last Admin   acetaminophen (TYLENOL) tablet 650 mg  650 mg Oral Q6H PRN Ntuen, Kris Hartmann, FNP   650 mg at 07/09/22 1359   albuterol (VENTOLIN HFA) 108 (90 Base) MCG/ACT inhaler 2 puff  2 puff Inhalation Q4H PRN Glora Hulgan, Tamela Oddi, NP       alum & mag hydroxide-simeth (MAALOX/MYLANTA) 200-200-20 MG/5ML suspension 30 mL  30 mL Oral Q4H PRN Ntuen, Kris Hartmann, FNP       amLODipine (NORVASC) tablet 2.5 mg  2.5 mg Oral Daily Nicholes Rough, NP   2.5 mg at 07/09/22 0746   diphenhydrAMINE (BENADRYL) capsule 25  mg  25 mg Oral Q6H PRN Nicholes Rough, NP       Or   diphenhydrAMINE (BENADRYL) injection 25 mg  25 mg Intramuscular Q6H PRN Lemoyne Nestor, NP       fluticasone (FLONASE) 50 MCG/ACT nasal spray 2 spray  2 spray Each Nare BID PRN Nicholes Rough, NP       hydrOXYzine (ATARAX) tablet 25 mg  25 mg Oral TID PRN Laretta Bolster, FNP   25 mg at 07/09/22 0747   lisinopril (ZESTRIL) tablet 40 mg  40 mg Oral Daily Nicholes Rough, NP   40 mg at 07/09/22 0745   LORazepam (ATIVAN) tablet 1 mg  1 mg Oral Q6H PRN Nicholes Rough, NP       Or   LORazepam (ATIVAN) injection 1 mg  1 mg Intramuscular Q6H PRN Ladanian Kelter, NP       magnesium hydroxide (MILK OF MAGNESIA) suspension 30 mL  30 mL Oral Daily PRN Ntuen, Kris Hartmann, FNP       omega-3 acid ethyl esters (LOVAZA) capsule 1 g  1 g Oral Daily Baden Betsch, NP   1 g at 07/09/22 0744   QUEtiapine (SEROQUEL XR) 24 hr tablet 400 mg  400 mg Oral QHS Elizette Shek, NP       QUEtiapine (SEROQUEL) tablet 200 mg  200 mg Oral BID Nicholes Rough, NP   200 mg at 07/09/22 1358   Vitamin D (Ergocalciferol) (DRISDOL) 1.25 MG (50000 UNIT) capsule 50,000 Units  50,000 Units Oral Q7 days Nicholes Rough, NP       vitamin D3 (CHOLECALCIFEROL) tablet 1,000 Units  1,000 Units Oral Daily Nicholes Rough, NP   1,000 Units at 07/09/22 0745    Lab Results:  Results for orders placed or performed during the hospital encounter of 07/07/22 (from the past 48 hour(s))  CBC     Status: None   Collection Time: 07/08/22  6:46 AM  Result Value Ref Range   WBC 9.4 4.0 - 10.5 K/uL   RBC 5.04 4.22 - 5.81 MIL/uL   Hemoglobin 15.7 13.0 - 17.0 g/dL   HCT 46.4 39.0 - 52.0 %   MCV 92.1 80.0 -  100.0 fL   MCH 31.2 26.0 - 34.0 pg   MCHC 33.8 30.0 - 36.0 g/dL   RDW 11.9 11.5 - 15.5 %   Platelets 232 150 - 400 K/uL   nRBC 0.0 0.0 - 0.2 %    Comment: Performed at [facility], [place] [address] [place], [place] [address]  Comprehensive metabolic panel     Status: Abnormal    Collection Time: 07/08/22  6:46 AM  Result Value Ref Range   Sodium 139 135 - 145 mmol/L   Potassium 4.1 3.5 - 5.1 mmol/L   Chloride 104 98 - 111 mmol/L   CO2 24 22 - 32 mmol/L   Glucose, Bld 102 (H) 70 - 99 mg/dL    Comment: Glucose reference range applies only to samples taken after fasting for at least 8 hours.   BUN 20 8 - 23 mg/dL   Creatinine, Ser 0.90 0.61 - 1.24 mg/dL   Calcium 9.4 8.9 - 10.3 mg/dL   Total Protein 7.6 6.5 - 8.1 g/dL   Albumin 4.6 3.5 - 5.0 g/dL   AST 19 15 - 41 U/L   ALT 19 0 - 44 U/L   Alkaline Phosphatase 53 38 - 126 U/L   Total Bilirubin 1.1 0.3 - 1.2 mg/dL   GFR, Estimated >60 >60 mL/min    Comment: (NOTE) Calculated using the CKD-EPI Creatinine Equation (2021)    Anion gap 11 5 - 15    Comment: Performed at [facility], [place] [address], [place], [place] [address]  Hemoglobin A1c     Status: None   Collection Time: 07/08/22  6:46 AM  Result Value Ref Range   Hgb A1c MFr Bld 5.1 4.8 - 5.6 %    Comment: (NOTE) Pre diabetes:          5.7%-6.4%  Diabetes:              >6.4%  Glycemic control for   <7.0% adults with diabetes    Mean Plasma Glucose 99.67 mg/dL    Comment: Performed at [facility] [address], [place], [place] [address]  Lipid panel     Status: Abnormal   Collection Time: 07/08/22  6:46 AM  Result Value Ref Range   Cholesterol 292 (H) 0 - 200 mg/dL   Triglycerides 109 <150 mg/dL   HDL 39 (L) >40 mg/dL   Total CHOL/HDL Ratio 7.5 RATIO   VLDL 22 0 - 40 mg/dL   LDL Cholesterol 231 (H) 0 - 99 mg/dL    Comment:        Total Cholesterol/HDL:CHD Risk Coronary Heart Disease Risk Table                     Men   Women  1/2 Average Risk   3.4   3.3  Average Risk       5.0   4.4  2 X Average Risk   9.6   7.1  3 X Average Risk  23.4   11.0        Use the calculated Patient Ratio above and the CHD Risk Table to determine the patient's CHD Risk.        ATP III CLASSIFICATION (LDL):  <100     mg/dL    Optimal  100-129  mg/dL   Near or Above                    Optimal  130-159  mg/dL   Borderline  160-189  mg/dL   High  >190     mg/dL   Very High Performed at [facility] [address] [place], [place] [address]   TSH     Status: None   Collection Time: 07/08/22  6:46 AM  Result Value Ref Range   TSH 1.493 0.350 - 4.500 uIU/mL    Comment: Performed by a 3rd Generation assay with a functional sensitivity of <=0.01 uIU/mL. Performed at [facility], [place] [address], [place], [place] [address]   Urinalysis, Complete w Microscopic Urine, Random     Status: Abnormal   Collection Time: 07/08/22  4:25 PM  Result Value Ref Range   Color, Urine YELLOW YELLOW   APPearance CLEAR CLEAR   Specific Gravity, Urine 1.014 1.005 - 1.030   pH 6.0 5.0 - 8.0   Glucose, UA 150 (A) NEGATIVE mg/dL   Hgb urine dipstick NEGATIVE NEGATIVE   Bilirubin Urine NEGATIVE NEGATIVE   Ketones, ur 20 (A) NEGATIVE mg/dL   Protein, ur NEGATIVE NEGATIVE mg/dL   Nitrite NEGATIVE NEGATIVE   Leukocytes,Ua NEGATIVE NEGATIVE   RBC / HPF 0-5 0 - 5 RBC/hpf   Bacteria, UA NONE SEEN NONE SEEN   Squamous Epithelial / LPF 0-5 0 - 5    Comment: Performed at [facility], [place] [address] [place], [place] [address]  Rapid urine drug screen (hospital performed)     Status: Abnormal   Collection Time: 07/08/22  4:25 PM  Result Value Ref Range   Opiates NONE DETECTED NONE DETECTED   Cocaine NONE DETECTED NONE DETECTED   Benzodiazepines POSITIVE (A) NONE DETECTED   Amphetamines NONE DETECTED NONE DETECTED   Tetrahydrocannabinol POSITIVE (A) NONE DETECTED   Barbiturates NONE DETECTED NONE DETECTED    Comment: (NOTE) DRUG SCREEN FOR MEDICAL PURPOSES ONLY.  IF CONFIRMATION IS NEEDED FOR ANY PURPOSE, NOTIFY LAB WITHIN 5 DAYS.  LOWEST DETECTABLE LIMITS FOR URINE DRUG SCREEN Drug Class                     Cutoff (ng/mL) Amphetamine and metabolites    1000 Barbiturate  and metabolites    200 Benzodiazepine                 200 Opiates and metabolites        300 Cocaine and metabolites        300 THC                            50 Performed at [facility], [place] [address] [place], [place] [address]   VITAMIN D 25 Hydroxy (Vit-D Deficiency, Fractures)     Status: Abnormal   Collection Time: 07/09/22  6:33 AM  Result Value Ref Range   Vit D, 25-Hydroxy 25.37 (L) 30 - 100 ng/mL    Comment: (NOTE) Vitamin D deficiency has been defined by the [place] practice guideline as a level of serum 25-OH  vitamin D less than 20 ng/mL (1,2). The Endocrine Society went on to  further define vitamin D insufficiency as a level between 21 and 29  ng/mL (2).  1. IOM (Institute of Medicine). 2010. Dietary reference intakes for  calcium and D. [place]: The [employer]. 2. Holick MF, Binkley [place], Bischoff-Ferrari HA, et al. Evaluation,  treatment, and prevention of vitamin D deficiency: an Endocrine  Society clinical practice guideline, JCEM. 2011 Jul; 96(7): 1911-30.  Performed at [facility], [place] [address], [place], [place] [address]   Vitamin B12     Status: None   Collection Time: 07/09/22  6:33 AM  Result Value Ref Range   Vitamin B-12 528 180 - 914 pg/mL    Comment: (NOTE) This assay is not validated for testing neonatal or myeloproliferative syndrome specimens for Vitamin B12 levels. Performed at [facility], [place] [address], [place], [place] [address]     Blood Alcohol level:  No results found for: "ETH"  Metabolic Disorder Labs: Lab Results  Component Value Date   HGBA1C 5.1 07/08/2022   MPG 99.67 07/08/2022   No results found for: "PROLACTIN" Lab Results  Component Value Date   CHOL 292 (H) 07/08/2022   TRIG 109 07/08/2022   HDL 39 (L) 07/08/2022   CHOLHDL 7.5 07/08/2022   VLDL 22 07/08/2022   LDLCALC 231 (H) 07/08/2022   LDLCALC 212 (H)  11/30/2020    Physical Findings: AIMS: 0 CIWA: n/a   COWS: n/a    Musculoskeletal: Strength & Muscle Tone: within normal limits Gait & Station: normal Patient leans: N/A  Psychiatric Specialty Exam:  Presentation  General Appearance:  Appropriate for Environment; Fairly Groomed  Eye Contact: Good  Speech: Clear and Coherent  Speech Volume: Normal  Handedness: Right   Mood and Affect  Mood: Depressed  Affect: Congruent   Thought Process  Thought Processes: Coherent  Descriptions of Associations:Intact  Orientation:Full (Time, Place and Person)  Thought Content:Logical  History of Schizophrenia/Schizoaffective disorder:No data recorded Duration of Psychotic Symptoms:No data recorded Hallucinations:Hallucinations: None  Ideas of Reference:None  Suicidal Thoughts:Suicidal Thoughts: No  Homicidal Thoughts:Homicidal Thoughts: No   Sensorium  Memory: Immediate Good  Judgment: Fair  Insight: Good  Executive Functions  Concentration: Good  Attention Span: Good  Recall: Good  Fund of Knowledge: Good  Language: Good  Psychomotor Activity  Psychomotor Activity: Psychomotor Activity: Normal  Assets  Assets: Communication Skills; Housing; Social Support  Sleep  Sleep: Sleep: Good  Physical Exam: Physical Exam Constitutional:      Appearance: Normal appearance.  HENT:     Head: Normocephalic.     Nose: No congestion or rhinorrhea.  Eyes:     Pupils: Pupils are equal, round, and reactive to light.  Pulmonary:     Effort: Pulmonary effort is normal.  Musculoskeletal:        General: Normal range of motion.     Cervical back: Normal range of motion.  Neurological:     Mental Status: He is alert and oriented to person, place, and time.     Sensory: No sensory deficit.     Coordination: Coordination normal.  Psychiatric:        Behavior: Behavior normal.    Review of Systems  Constitutional:  Negative for fever.   HENT: Negative.  Negative for hearing loss.   Eyes: Negative.   Respiratory: Negative.    Cardiovascular: Negative.   Gastrointestinal: Negative.   Genitourinary: Negative.   Musculoskeletal: Negative.   Skin: Negative.  Negative for rash.  Neurological: Negative.   Psychiatric/Behavioral:  Positive for depression and substance abuse. Negative for hallucinations, memory loss and suicidal ideas. The patient is nervous/anxious and has insomnia.    Blood pressure (!) 130/93, pulse 87, temperature 97.6 F (36.4 C), temperature source Oral, resp. rate 19, height 5' 9.5" (1.765 m), weight 79.4 kg, SpO2 97 %. Body mass index is 25.47  kg/m.  Treatment Plan Summary: Treatment Plan Summary: Daily contact with patient to assess and evaluate symptoms and progress in treatment and Medication management   Observation Level/Precautions:  15 minute checks  Laboratory:  Labs reviewed   Psychotherapy:  Unit Group sessions  Medications:  See [facility]  Consultations:  To be determined   Discharge Concerns:  Safety, medication compliance, mood stability  Estimated LOS: 5-7 days  Other:  N/A    Labs reviewed on 07/08/2022: CMP within normal limits, lipid panel with cholesterol 292, HDL 39, LDL- 231, patient educated on healthy food choices and exercise, agreeable to following this up with his cardiologist after discharge for a possible order of antilipid medications.  CBC WNL, hemoglobin A1c WNL, TSH WNL.  Will order repeat EKG, vitamin D, vitamin B-12.  Baseline UA ordered.  Urine tox screen ordered.  MoCA testing ordered due to concerns of memory loss.   PLAN Safety and Monitoring: Voluntary admission to inpatient psychiatric unit for safety, stabilization and treatment Daily contact with patient to assess and evaluate symptoms and progress in treatment Patient's case to be discussed in multi-disciplinary team meeting Observation Level : q15 minute checks Vital signs: q12 hours Precautions: Safety    Long Term Goal(s): Improvement in symptoms so as ready for discharge   Short Term Goals: Ability to identify changes in lifestyle to reduce recurrence of condition will improve, Ability to disclose and discuss suicidal ideas, Ability to demonstrate self-control will improve, Ability to identify and develop effective coping behaviors will improve, Compliance with prescribed medications will improve, and Ability to identify triggers associated with substance abuse/mental health issues will improve   Diagnoses  Principal Problem:   Bipolar 2 disorder, major depressive episode ([place]) Active Problems:   Hyperlipidemia   Hypertension, essential, benign   Insomnia   GAD (generalized anxiety disorder)    Medications -Continue a.m. Seroquel 200 mg immediate release in the morning and 200 mg in the afternoon for mood stabilization/anxiety (EKG from 10/25 with QTc of 402 and nonspecific T wave abnormalities.  Repeated EKG ST with QTC WNL) -Continue p.m. Seroquel 400 mg XL at bedtime for mood stabilization (home med) -Continue Hydroxyzine 25 mg every 6 hours PRN for anxiety -Continue lisinopril 40 mg daily (home med) -Continue omega-3 daily for hyperlipidemia-educated on the need to follow-up with cardiologist after this hospitalization due to elevated lipids -Continue vitamin D3 1000 mg daily -Continue Norvasc 2.5 mg daily for hypertension (home med) -Continue albuterol inhaler every 6 hours as needed for SOB/wheezing -Continue Flonase daily as needed for seasonal allergic rhinitis -Continue agitation protocol as needed Benadryl/Ativan as needed-See Mar -Start Vitamin D 50,000 units weekly for low vit D levels   Other PRNS -Continue Tylenol 650 mg every 6 hours PRN for mild pain -Continue Maalox 30 mg every 4 hrs PRN for indigestion -Continue Milk of Magnesia as needed every 6 hrs for constipation   Discharge Planning: Social work and case management to assist with discharge planning and  identification of hospital follow-up needs prior to discharge Estimated LOS: 5-7 days Discharge Concerns: Need to establish a safety plan; Medication compliance and effectiveness Discharge Goals: Return home with outpatient referrals for mental health follow-up including medication management/psychotherapy   I certify that inpatient services furnished can reasonably be expected to improve the patient's condition.      Nicholes Rough, NP 07/09/2022, 3:32 PM

## 2022-07-09 NOTE — Progress Notes (Signed)
Patient stated he was told that he would see Dr Lurline Hare today and would be prescribed a new antidepressant medication.   Patient stated he did not see MD today.  Patient asks if he is going to be prescribed a new medicine.  Patient wants to make sure he is still on track to discharge Friday.

## 2022-07-10 MED ORDER — AMLODIPINE BESYLATE 5 MG PO TABS
5.0000 mg | ORAL_TABLET | Freq: Every day | ORAL | Status: DC
  Administered 2022-07-11: 5 mg via ORAL
  Filled 2022-07-10 (×2): qty 1

## 2022-07-10 MED ORDER — AMLODIPINE BESYLATE 2.5 MG PO TABS
2.5000 mg | ORAL_TABLET | Freq: Every day | ORAL | Status: AC
  Administered 2022-07-10: 2.5 mg via ORAL
  Filled 2022-07-10: qty 1

## 2022-07-10 NOTE — [facility] Group Notes (Signed)
[place] Group Notes:  (Nursing/MHT/Case Management/Adjunct)  Date:  07/10/2022  Time:  10:28 AM  Group Topic/Focus:  Goals Group: The focus of this group is to help patients establish daily goals to achieve during treatment and discuss how the patient can incorporate goal setting into their daily lives to aide in recovery.   Participation Level:  Did Not Attend  Summary of Progress/Problems:  Patient did not attend orientation/ goals group.   Elza Rafter 07/10/2022, 10:28 AM

## 2022-07-10 NOTE — Evaluation (Signed)
Occupational Therapy Evaluation Patient Details Name: Chase Hunter. MRN: [id number] DOB: 26-Jan-1961 Today's Date: 07/10/2022   History of Present Illness  Chase Hunter. Is a 61 yo Caucasian male with past mental health diagnoses of Bipolar 2 d/o & GAD who presented to this [facility] on 07/07/22 with complaints of worsening depressive symptoms and +SI with a plan to hang himself after a verbal altercation with his wife. Pt was admitted voluntarily for treatment and stabilization of his mood.    Clinical Impression   OT was consulted due to concerns related to [facility] issues. OT met w/ pt in room and performed a basic physical and ADL assessment where all findings were WFL/WNL. OT administered the MoCA v 8.2 where his total score is 30/30. His MIS / Memory Index Score is recorded as 15/15. TMT B was performed w/o error as was the CDT in construction, contour and digit expression w/ accurate time setting to 10 past 11. Pt demonstrated normal processing speed t/out session. Based on today's findings and subjective reporting provided by pt it appears any memory complication is likely a result of symptom severity related to his BPD where attention / concentration may wax and wane. There are no further OT needs at this time and OT will now sign off. Pt is independent and is set for DC as soon as tomorrow per pt and nursing reports. OT thanks you for this consult.      Recommendations for follow up therapy are one component of a multi-disciplinary discharge planning process, led by the attending physician.  Recommendations may be updated based on patient status, additional functional criteria and insurance authorization.   Follow Up Recommendations  No OT follow up    Assistance Recommended at Discharge None  Patient can return home with the following  NA / pt is independent for all areas of ADLs and iADLs    Functional Status Assessment  Patient has not had a recent decline in their  functional status  Equipment Recommendations  None recommended by OT    Recommendations for Other Services  NONE     Precautions / Restrictions Restrictions Weight Bearing Restrictions: No      Mobility Bed Mobility Overal bed mobility: Independent                  Transfers Overall transfer level: Independent                        Balance Overall balance assessment: Independent                                         ADL either performed or assessed with clinical judgement   ADL Overall ADL's : Independent                                             Vision Baseline Vision/History: 1 Wears glasses Ability to See in Adequate Light: 0 Adequate Patient Visual Report: No change from baseline       Perception Perception Perception: Within Functional Limits   Praxis Praxis Praxis: Intact    Pertinent Vitals/Pain Pain Assessment Pain Assessment: No/denies pain     Hand Dominance Right   Extremity/Trunk Assessment  [facility]  Communication     Cognition Arousal/Alertness: Awake/alert Behavior During Therapy: WFL for tasks assessed/performed Overall Cognitive Status: Within Functional Limits for tasks assessed                                 General Comments: MoCA v 8.2: 30/30; Memory Index Score (MIS) 15/15     General Comments   OT rules out any cognitive deficits related to memory dysfunction / pt would however likely benefit from cont'd therapy in a PHP setting.                Home Living Family/patient expects to be discharged to:: Private residence Living Arrangements: Spouse/significant other                           Home Equipment: None          Prior Functioning/Environment Prior Level of Function : Independent/Modified Independent                        OT Problem List: Other (comment) (concerns for memory loss)    AM-PAC OT "6  Clicks" Daily Activity     Outcome Measure Help from another person eating meals?: None Help from another person taking care of personal grooming?: None Help from another person toileting, which includes using toliet, bedpan, or urinal?: None Help from another person bathing (including washing, rinsing, drying)?: None Help from another person to put on and taking off regular upper body clothing?: None Help from another person to put on and taking off regular lower body clothing?: None 6 Click Score: 24   End of Session Nurse Communication: Other (comment) (MoCA results / status of STM and global cognition)  Activity Tolerance: Patient tolerated treatment well Patient left: in chair  OT Visit Diagnosis: Cognitive communication deficit (R41.841)                Time: 1350-1435 OT Time Calculation (min): 45 min Charges:  OT General Charges $OT Visit: 1 Visit OT Evaluation $OT Eval Low Complexity: 1 Low OT Treatments $Cognitive Funtion inital: Initial 15 mins  Cornell Barman, OT   Brantley Stage 07/10/2022, 5:11 PM

## 2022-07-10 NOTE — Progress Notes (Addendum)
[facility] MD Progress Note  07/10/2022 11:19 AM [place].  MRN:  [id number] Principal Problem: Bipolar 2 disorder, major depressive episode ([place]) Diagnosis: Principal Problem:   Bipolar 2 disorder, major depressive episode ([place]) Active Problems:   Hyperlipidemia   Hypertension, essential, benign   Insomnia   GAD (generalized anxiety disorder)  Reason For Admission: Chase Hunter. Is a 61 yo Caucasian male with past mental health diagnoses of Bipolar II, GAD who presented to [facility] on 07/07/22 with complaints of worsening depressive symptoms and suicidal ideations with a plan to hang himself after a verbal altercation with his wife. Pt was admitted voluntarily for treatment and stabilization of his mood.    24 hour chart review: V/S for the past 24 hours show elevated HR, DBP; continues to take Lisinopril 40 mg daily, Amlodipine 2.5 mg daily for hypertension.  Per chart review, pt has taken all medications as prescribed. PRN medications given in the past 24 hours as follows; Diphenhydramine 25 mg 2017, Hydroxyzine 25 mg 0800 anxiety, and Tylenol 650 mg yesterday 1359 afternoon.  Patient noted to be visible in milieu and intermittently participating in unit group activities.   Assessment: Patient observed in dayroom interacting with peers and staff appropriately. Presents alert and oriented. Calm and cooperative. Attention to ADLs noted, eye contact fair. Speech is clear and coherent. Thought contents logical and organized. He reports reason for admission was related to suicidal ideations following an argument with his wife; expressed some remorse regarding incident. He denies any suicidal ideations since incident. Reports being diagnosed with bipolar x8 years ago. Currently receives outpatient medication management and therapy via [facility]; next appointment 07/13/22. Describes mood as having some anxiety over the past few days "about the unknown" as far as unit flow and  "discharge plan" and asked if he had one. Rates anxiety as a 3-4/10, depression 0/10. Provider discussed current plan for discharge on Friday to return home to his family and follow up with outpatient providers. He was receptive and denies any safety concerns at this time. He denies any access to firearms or intent/plan to harm himself or anyone. Identifies his wife, daughter, and grandchild as protective factors. Provider discussed contents of UDS in which reports smoking "legal" marijuana from local smoke shops; provider discussed possible effects of the substances on his mental health in which he states plan to not indulge further. He reports completing a Wellness Recovery Action Plan [facility] Plan) he learned from group and showed details to sustain remission of symptoms upon discharge. He endorses ongoing medication compliance and denies any noted side effects.   Labs reviewed on 07/09/2022: CMP within normal limits, lipid panel with cholesterol 292, HDL 39, LDL- 231, patient educated on healthy food choices and exercise, agreeable to following this up with his cardiologist after discharge for a possible order of antilipid medications.  CBC WNL, hemoglobin A1c WNL, TSH WNL. EKG with Sinus tach and Qtc 430. Vitamin D slightly low at 25.37. Will supplement with Vitamin D 50,000 units weekly. Vitamin B-12 WNL.  Baseline UA with glucose and ketones, but otherwise WNL. Urine tox screen + Benzos and +THC. Given Ativan at [facility] prior to urine collection.  MoCA testing ordered due to concerns of memory loss. OT on unit to complete testing, but pt outside for recreational activities. OT states will be back tomorrow morning to complete testing.   Total Time spent with patient: 30 minutes  Past Psychiatric History: MDD and GAD  Past Medical History:  Past Medical History:  Diagnosis Date   Allergy    Anemia    Asthma    Bipolar II disorder ([place])    Depression    Gallstones    GERD (gastroesophageal reflux  disease)    Hx of adenomatous colonic polyps    Hyperlipidemia    Hypertension    Pancreatitis    Seizures ([place])    past hx 6 yrs ago x 1- coming off meds caused 1 seizure     Past Surgical History:  Procedure Laterality Date   COLONOSCOPY     ESOPHAGOGASTRODUODENOSCOPY N/A 07/19/2020   Procedure: ESOPHAGOGASTRODUODENOSCOPY (EGD);  Surgeon: Milus Banister, MD;  Location: [facility];  Service: Endoscopy;  Laterality: N/A;   EUS N/A 07/19/2020   Procedure: UPPER ENDOSCOPIC ULTRASOUND (EUS) RADIAL;  Surgeon: Milus Banister, MD;  Location: [facility];  Service: Endoscopy;  Laterality: N/A;   POLYPECTOMY     Family History:  Family History  Problem Relation Age of Onset   Atrial fibrillation Mother    Anxiety disorder Mother    Obesity Mother    Mood Disorder Mother    Anxiety disorder Father    Prostate cancer Father    Brain cancer Father    Anxiety disorder Brother    Anxiety disorder Daughter    Colon cancer Neg Hx    Colon polyps Neg Hx    Esophageal cancer Neg Hx    Rectal cancer Neg Hx    Stomach cancer Neg Hx    Family Psychiatric  History: See H & P Social History:  Social History   Substance and Sexual Activity  Alcohol Use Never     Social History   Substance and Sexual Activity  Drug Use Not Currently    Social History   Socioeconomic History   Marital status: Married    Spouse name: Not on file   Number of children: 1   Years of education: Not on file   Highest education level: Not on file  Occupational History   Occupation: retired Pharmacist, hospital  Tobacco Use   Smoking status: Never   Smokeless tobacco: Never  Substance and Sexual Activity   Alcohol use: Never   Drug use: Not Currently   Sexual activity: Not on file  Other Topics Concern   Not on file  Social History Narrative   Not on file   Social Determinants of Health   Financial Resource Strain: Low Risk  (06/14/2021)   Overall Financial Resource Strain (CARDIA)    Difficulty of  Paying Living Expenses: Not hard at all  Food Insecurity: No Food Insecurity (07/07/2022)   Hunger Vital Sign    Worried About Running Out of Food in the Last Year: Never true    Ran Out of Food in the Last Year: Never true  Transportation Needs: No Transportation Needs (07/07/2022)   PRAPARE - Hydrologist (Medical): No    Lack of Transportation (Non-Medical): No  Physical Activity: Insufficiently Active (06/14/2021)   Exercise Vital Sign    Days of Exercise per Week: 3 days    Minutes of Exercise per Session: 40 min  Stress: Stress Concern Present (06/14/2021)   [place]    Feeling of Stress : Very much  Social Connections: Moderately Integrated (06/14/2021)   Social Connection and Isolation Panel [NHANES]    Frequency of Communication with Friends and Family: Three times a week    Frequency of Social  Gatherings with Friends and Family: Three times a week    Attends Religious Services: Never    Active Member of Clubs or Organizations: Yes    Attends Music therapist: More than 4 times per year    Marital Status: Married   Sleep: Good  Appetite:  Fair  Current Medications: Current Facility-Administered Medications  Medication Dose Route Frequency Provider Last Rate Last Admin   acetaminophen (TYLENOL) tablet 650 mg  650 mg Oral Q6H PRN Ntuen, Kris Hartmann, FNP   650 mg at 07/09/22 1359   albuterol (VENTOLIN HFA) 108 (90 Base) MCG/ACT inhaler 2 puff  2 puff Inhalation Q4H PRN Nkwenti, Tamela Oddi, NP       alum & mag hydroxide-simeth (MAALOX/MYLANTA) 200-200-20 MG/5ML suspension 30 mL  30 mL Oral Q4H PRN Ntuen, Kris Hartmann, FNP       amLODipine (NORVASC) tablet 2.5 mg  2.5 mg Oral Daily Nkwenti, Doris, NP   2.5 mg at 07/10/22 0805   diphenhydrAMINE (BENADRYL) capsule 25 mg  25 mg Oral Q6H PRN Nicholes Rough, NP   25 mg at 07/09/22 2017   Or   diphenhydrAMINE (BENADRYL) injection 25 mg  25  mg Intramuscular Q6H PRN Nkwenti, Tamela Oddi, NP       fluticasone (FLONASE) 50 MCG/ACT nasal spray 2 spray  2 spray Each Nare BID PRN Nicholes Rough, NP       hydrOXYzine (ATARAX) tablet 25 mg  25 mg Oral TID PRN Laretta Bolster, FNP   25 mg at 07/10/22 0806   lisinopril (ZESTRIL) tablet 40 mg  40 mg Oral Daily Nicholes Rough, NP   40 mg at 07/10/22 0804   LORazepam (ATIVAN) tablet 1 mg  1 mg Oral Q6H PRN Nicholes Rough, NP       Or   LORazepam (ATIVAN) injection 1 mg  1 mg Intramuscular Q6H PRN Nkwenti, Doris, NP       magnesium hydroxide (MILK OF MAGNESIA) suspension 30 mL  30 mL Oral Daily PRN Ntuen, Kris Hartmann, FNP       omega-3 acid ethyl esters (LOVAZA) capsule 1 g  1 g Oral Daily Nkwenti, Doris, NP   1 g at 07/10/22 0804   QUEtiapine (SEROQUEL XR) 24 hr tablet 400 mg  400 mg Oral QHS Nkwenti, Doris, NP   400 mg at 07/09/22 2017   QUEtiapine (SEROQUEL) tablet 200 mg  200 mg Oral BID Nicholes Rough, NP   200 mg at 07/10/22 0804   Vitamin D (Ergocalciferol) (DRISDOL) 1.25 MG (50000 UNIT) capsule 50,000 Units  50,000 Units Oral Q7 days Nicholes Rough, NP   50,000 Units at 07/09/22 1641   vitamin D3 (CHOLECALCIFEROL) tablet 1,000 Units  1,000 Units Oral Daily Nicholes Rough, NP   1,000 Units at 07/10/22 0805   Lab Results:  Results for orders placed or performed during the hospital encounter of 07/07/22 (from the past 48 hour(s))  Urinalysis, Complete w Microscopic Urine, Random     Status: Abnormal   Collection Time: 07/08/22  4:25 PM  Result Value Ref Range   Color, Urine YELLOW YELLOW   APPearance CLEAR CLEAR   Specific Gravity, Urine 1.014 1.005 - 1.030   pH 6.0 5.0 - 8.0   Glucose, UA 150 (A) NEGATIVE mg/dL   Hgb urine dipstick NEGATIVE NEGATIVE   Bilirubin Urine NEGATIVE NEGATIVE   Ketones, ur 20 (A) NEGATIVE mg/dL   Protein, ur NEGATIVE NEGATIVE mg/dL   Nitrite NEGATIVE NEGATIVE   Leukocytes,Ua NEGATIVE NEGATIVE   RBC /  HPF 0-5 0 - 5 RBC/hpf   Bacteria, UA NONE SEEN NONE SEEN    Squamous Epithelial / LPF 0-5 0 - 5    Comment: Performed at [facility], [place] [address], [place], [place] [address]  Rapid urine drug screen (hospital performed)     Status: Abnormal   Collection Time: 07/08/22  4:25 PM  Result Value Ref Range   Opiates NONE DETECTED NONE DETECTED   Cocaine NONE DETECTED NONE DETECTED   Benzodiazepines POSITIVE (A) NONE DETECTED   Amphetamines NONE DETECTED NONE DETECTED   Tetrahydrocannabinol POSITIVE (A) NONE DETECTED   Barbiturates NONE DETECTED NONE DETECTED    Comment: (NOTE) DRUG SCREEN FOR MEDICAL PURPOSES ONLY.  IF CONFIRMATION IS NEEDED FOR ANY PURPOSE, NOTIFY LAB WITHIN 5 DAYS.  LOWEST DETECTABLE LIMITS FOR URINE DRUG SCREEN Drug Class                     Cutoff (ng/mL) Amphetamine and metabolites    1000 Barbiturate and metabolites    200 Benzodiazepine                 200 Opiates and metabolites        300 Cocaine and metabolites        300 THC                            50 Performed at [facility], [place] [address] [place], [place] [address]   VITAMIN D 25 Hydroxy (Vit-D Deficiency, Fractures)     Status: Abnormal   Collection Time: 07/09/22  6:33 AM  Result Value Ref Range   Vit D, 25-Hydroxy 25.37 (L) 30 - 100 ng/mL    Comment: (NOTE) Vitamin D deficiency has been defined by the [place] practice guideline as a level of serum 25-OH  vitamin D less than 20 ng/mL (1,2). The Endocrine Society went on to  further define vitamin D insufficiency as a level between 21 and 29  ng/mL (2).  1. IOM (Institute of Medicine). 2010. Dietary reference intakes for  calcium and D. [place]: The [employer]. 2. Holick MF, Binkley [place], Bischoff-Ferrari HA, et al. Evaluation,  treatment, and prevention of vitamin D deficiency: an Endocrine  Society clinical practice guideline, JCEM. 2011 Jul; 96(7): 1911-30.  Performed at [facility],  [place] [address] [place], [place] [address]   Vitamin B12     Status: None   Collection Time: 07/09/22  6:33 AM  Result Value Ref Range   Vitamin B-12 528 180 - 914 pg/mL    Comment: (NOTE) This assay is not validated for testing neonatal or myeloproliferative syndrome specimens for Vitamin B12 levels. Performed at [facility], [place] [address], [place],  [address]    Blood Alcohol level:  No results found for: "[facility]"  Metabolic Disorder Labs: Lab Results  Component Value Date   HGBA1C 5.1 07/08/2022   MPG 99.67 07/08/2022   No results found for: "PROLACTIN" Lab Results  Component Value Date   CHOL 292 (H) 07/08/2022   TRIG 109 07/08/2022   HDL 39 (L) 07/08/2022   CHOLHDL 7.5 07/08/2022   VLDL 22 07/08/2022   LDLCALC 231 (H) 07/08/2022   LDLCALC 212 (H) 11/30/2020    Physical Findings: AIMS: 0 CIWA: n/a   COWS: n/a    Musculoskeletal: Strength & Muscle Tone: within normal limits  Gait & Station: normal Patient leans: N/A  Psychiatric Specialty Exam:  Presentation  General Appearance:  Appropriate for Environment; Fairly Groomed  Eye Contact: Good  Speech: Clear and Coherent  Speech Volume: Normal  Handedness: Right  Mood and Affect  Mood: Depressed  Affect: Congruent  Thought Process  Thought Processes: Coherent  Descriptions of Associations:Intact  Orientation:Full (Time, Place and Person)  Thought Content:Logical  History of Schizophrenia/Schizoaffective disorder:No data recorded Duration of Psychotic Symptoms:No data recorded Hallucinations:Hallucinations: None  Ideas of Reference:None  Suicidal Thoughts:Suicidal Thoughts: No  Homicidal Thoughts:Homicidal Thoughts: No  Sensorium  Memory: Immediate Good  Judgment: Fair  Insight: Good  Executive Functions  Concentration: Good  Attention Span: Good  Recall: Good  Fund of Knowledge: Good  Language: Good  Psychomotor Activity   Psychomotor Activity: Psychomotor Activity: Normal  Assets  Assets: Communication Skills; Housing; Social Support  Sleep  Sleep: Sleep: Good  Physical Exam: Physical Exam Vitals and nursing note reviewed.  Constitutional:      Appearance: Normal appearance.  HENT:     Head: Normocephalic.     Nose: No congestion or rhinorrhea.  Eyes:     Pupils: Pupils are equal, round, and reactive to light.  Cardiovascular:     Rate and Rhythm: Tachycardia present.  Pulmonary:     Effort: Pulmonary effort is normal.  Musculoskeletal:        General: Normal range of motion.     Cervical back: Normal range of motion.  Neurological:     Mental Status: He is alert and oriented to person, place, and time.     Sensory: No sensory deficit.     Coordination: Coordination normal.  Psychiatric:        Behavior: Behavior normal.    Review of Systems  Constitutional:  Negative for fever.  HENT: Negative.  Negative for hearing loss.   Eyes: Negative.   Respiratory: Negative.    Cardiovascular: Negative.   Gastrointestinal: Negative.   Genitourinary: Negative.   Musculoskeletal: Negative.   Skin: Negative.  Negative for rash.  Neurological: Negative.   Psychiatric/Behavioral:  Positive for substance abuse. Negative for hallucinations, memory loss and suicidal ideas. The patient is nervous/anxious.    Blood pressure (!) 127/98, pulse (!) 103, temperature 97.6 F (36.4 C), temperature source Oral, resp. rate 18, height 5' 9.5" (1.765 m), weight 79.4 kg, SpO2 98 %. Body mass index is 25.47 kg/m.  Treatment Plan Summary: Daily contact with patient to assess and evaluate symptoms and progress in treatment and Medication management   Observation Level/Precautions:  15 minute checks  Laboratory:  Labs reviewed   Psychotherapy:  Unit Group sessions  Medications:  See [facility]  Consultations:  To be determined   Discharge Concerns:  Safety, medication compliance, mood stability  Estimated LOS:  5-7 days  Other:  N/A    Labs reviewed on 07/08/2022: CMP within normal limits, lipid panel with cholesterol 292, HDL 39, LDL- 231, patient educated on healthy food choices and exercise, agreeable to following this up with his cardiologist after discharge for a possible order of antilipid medications.  CBC WNL, hemoglobin A1c WNL, TSH WNL.  Will order repeat EKG, vitamin D, vitamin B-12.  Baseline UA ordered.  Urine tox screen ordered.  MoCA testing ordered due to concerns of memory loss.   PLAN Safety and Monitoring: Voluntary admission to inpatient psychiatric unit for safety, stabilization and treatment Daily contact with patient to assess and evaluate symptoms and progress in treatment Patient's case to be discussed in  multi-disciplinary team meeting Observation Level : q15 minute checks Vital signs: q12 hours Precautions: Safety   Long Term Goal(s): Improvement in symptoms so as ready for discharge   Short Term Goals: Ability to identify changes in lifestyle to reduce recurrence of condition will improve, Ability to disclose and discuss suicidal ideas, Ability to demonstrate self-control will improve, Ability to identify and develop effective coping behaviors will improve, Compliance with prescribed medications will improve, and Ability to identify triggers associated with substance abuse/mental health issues will improve   Diagnoses  Principal Problem:   Bipolar 2 disorder, major depressive episode (H[facility]) Active Problems:   Hyperlipidemia   Hypertension, essential, benign   Insomnia   GAD (generalized anxiety disorder)    Medications -Continue: - Seroquel a.m. 200 mg immediate release in the morning and 200 mg in the afternoon for mood stabilization/anxiety (EKG from 10/25 with QTc of 402 and nonspecific T wave abnormalities.  Repeated EKG ST with QTC WNL) - Seroquel p.m. 400 mg XL at bedtime for mood  stabilization (home med) - Hydroxyzine 25 mg every 6 hours PRN for anxiety -  Lisinopril 40 mg daily (home med) - Omega-3 daily for hyperlipidemia-educated on the need to  follow-up with cardiologist after this hospitalization due to elevated lipids - Vitamin D3 1000 mg daily - Albuterol inhaler every 6 hours as needed for SOB/wheezing - Flonase daily as needed for seasonal allergic rhinitis - Agitation protocol as needed Benadryl/Ativan as  needed-See Mar - Vitamin D 50,000 units weekly for low vit D levels -Increase: - Norvasc 5 mg daily for hypertension (home med)  Other PRNS -Continue Tylenol 650 mg every 6 hours PRN for mild pain -Continue Maalox 30 mg every 4 hrs PRN for indigestion -Continue Milk of Magnesia as needed every 6 hrs for constipation   Discharge Planning: Social work and case management to assist with discharge planning and identification of hospital follow-up needs prior to discharge Estimated LOS: 5-7 days Discharge Concerns: Need to establish a safety plan; Medication compliance and effectiveness Discharge Goals: Return home with outpatient referrals for mental health follow-up including medication management/psychotherapy   I certify that inpatient services furnished can reasonably be expected to improve the patient's condition.      Inda Merlin, NP 07/10/2022, 11:19 AMPatient ID: Chase Reichert., male   DOB: [birth year], 61 y.o.   MRN: [id number]

## 2022-07-10 NOTE — Progress Notes (Signed)
D. Pt presents with improving mood, is pleasant upon approach, reported some mild anxiety this am. Pt reported that he was looking forward to being discharged soon. . Pt currently denies SI/HI and AVH  A. Labs and vitals monitored. Pt given and educated on medications. Pt supported emotionally and encouraged to express concerns and ask questions.   R. Pt remains safe with 15 minute checks. Will continue POC.

## 2022-07-10 NOTE — [facility] Suicide Risk Assessment (Addendum)
[place] INPATIENT:  Family/Significant Other Suicide Prevention Education  Suicide Prevention Education:  Education Completed; Chase Hunter [phone] (Wife) has been identified by the patient as the family member/significant other with whom the patient will be residing, and identified as the person(s) who will aid the patient in the event of a mental health crisis (suicidal ideations/suicide attempt).  With written consent from the patient, the family member/significant other has been provided the following suicide prevention education, prior to the and/or following the discharge of the patient.  The suicide prevention education provided includes the following: Suicide risk factors Suicide prevention and interventions National Suicide Hotline telephone number [facility] assessment telephone number [facility] Emergency Assistance [facility] and/or Residential Mobile Crisis Unit telephone number  Request made of family/significant other to: Remove weapons (e.g., guns, rifles, knives), all items previously/currently identified as safety concern.   Remove drugs/medications (over-the-counter, prescriptions, illicit drugs), all items previously/currently identified as a safety concern.  The family member/significant other verbalizes understanding of the suicide prevention education information provided.  The family member/significant other agrees to remove the items of safety concern listed above.  CSW spoke with Mrs. Hunter who states that she recently had an argument with her husband.  She states that this does not happen often and "when it did, it really upset him".  She states that her husband went for a walk and then decided to walk to a [place] facility due to having suicidal thoughts.  She states that her husband has had suicidal thoughts in the past and has talked with her about them.  She states that recently he has been experiencing difficulties with  managing his emotions and wanted help stabilizing his mood with his medications.  She states that "I don't believe he will hurt himself because he has a lot of family support and a new grandchild here at home".  Mrs. Hunter confirms that her husband can return home after discharge.  She states that there are no firearms or weapons in the home.  She also states that she has spoken with him since he has been in the hospital and feels that he is doing better.  CSW completed SPE with Mrs. Hunter.   Chase Hunter 07/10/2022, 3:33 PM

## 2022-07-11 ENCOUNTER — Telehealth: Payer: Self-pay | Admitting: Psychiatry

## 2022-07-11 DIAGNOSIS — F3181 Bipolar II disorder: Principal | ICD-10-CM

## 2022-07-11 MED ORDER — LISINOPRIL 40 MG PO TABS: 40.0000 mg | ORAL_TABLET | Freq: Every day | ORAL | 0 refills | Status: AC

## 2022-07-11 MED ORDER — VITAMIN D3 25 MCG PO TABS: 1000.0000 [IU] | ORAL_TABLET | Freq: Every day | ORAL | 0 refills | Status: DC

## 2022-07-11 MED ORDER — HYDROXYZINE HCL 25 MG PO TABS: 25.0000 mg | ORAL_TABLET | Freq: Three times a day (TID) | ORAL | 0 refills | Status: DC | PRN

## 2022-07-11 MED ORDER — FLUTICASONE PROPIONATE 50 MCG/ACT NA SUSP: 2.0000 | Freq: Two times a day (BID) | NASAL | 0 refills | Status: DC | PRN

## 2022-07-11 MED ORDER — QUETIAPINE FUMARATE ER 400 MG PO TB24: 400.0000 mg | ORAL_TABLET | Freq: Every day | ORAL | 0 refills | Status: DC

## 2022-07-11 MED ORDER — ALBUTEROL SULFATE HFA 108 (90 BASE) MCG/ACT IN AERS: 2.0000 | INHALATION_SPRAY | RESPIRATORY_TRACT | 0 refills | Status: DC | PRN

## 2022-07-11 MED ORDER — VITAMIN D (ERGOCALCIFEROL) 1.25 MG (50000 UNIT) PO CAPS: 50000.0000 [IU] | ORAL_CAPSULE | ORAL | 0 refills | Status: DC

## 2022-07-11 MED ORDER — AMLODIPINE BESYLATE 5 MG PO TABS: 5.0000 mg | ORAL_TABLET | Freq: Every day | ORAL | 0 refills | Status: AC

## 2022-07-11 MED ORDER — QUETIAPINE FUMARATE 200 MG PO TABS: 200.0000 mg | ORAL_TABLET | Freq: Two times a day (BID) | ORAL | 0 refills | Status: DC

## 2022-07-11 MED ORDER — OMEGA-3-ACID ETHYL ESTERS 1 G PO CAPS: 1.0000 g | ORAL_CAPSULE | Freq: Every day | ORAL | 0 refills | Status: DC

## 2022-07-11 NOTE — Progress Notes (Signed)
Patient discharged from [facility] on 07/11/2022 at 11:00am. Pt denies SI, plan, and intention. Suicide safety plan completed, reviewed with this RN, given to the patient, and a copy in the chart. Patient denies HI/AVH upon discharge. Patient rates his depression a 0/10 and his anxiety a 5/10. Patient is alert, oriented, and cooperative. RN provided patient with discharge paperwork and reviewed information with patient. Patient expressed that he understood all of the discharge instructions. Pt was satisfied with belongings returned to him from the locker and at bedside. Discharged patient to his wife in the [facility] waiting room.

## 2022-07-11 NOTE — Telephone Encounter (Signed)
Pt informed

## 2022-07-11 NOTE — Telephone Encounter (Signed)
Next appt is 07/16/22. Chase Hunter was discharged from the [facility] hospital today. While at the hospital they put him on Hydroxyzine 25 mg for anxiety every 6 hours. He is asking if [name] can write him a RX for this so he can continue taking it. His phone number is [phone].

## 2022-07-11 NOTE — Group Note (Signed)
Date:  07/11/2022 Time:  11:12 AM  Group Topic/Focus:  Orientation:   The focus of this group is to educate the patient on the purpose and policies of crisis stabilization and provide a format to answer questions about their admission.  The group details unit policies and expectations of patients while admitted.    Participation Level:  Did Not Attend  Participation Quality:      Affect:      Cognitive:      Insight: None  Engagement in Group:      Modes of Intervention:      Additional Comments:      Jerrye Beavers 07/11/2022, 11:12 AM

## 2022-07-11 NOTE — [facility] Suicide Risk Assessment (Signed)
Suicide Risk Assessment  Discharge Assessment    [facility] Discharge Suicide Risk Assessment   Principal Problem: Bipolar 2 disorder [facility]) Discharge Diagnoses: Principal Problem:   Bipolar 2 disorder ([place]) Active Problems:   Hyperlipidemia   Hypertension, essential, benign   Insomnia   GAD (generalized anxiety disorder)  Reason For Admission: Chase Hunter. Is a 61 yo Caucasian male with past mental health diagnoses of Bipolar 2 d/o & GAD who presented to this [facility] on 07/07/22 with complaints of worsening depressive symptoms and +SI with a plan to hang himself after a verbal altercation with his wife. Pt was admitted voluntarily for treatment and stabilization of his mood.                                                 HOSPITAL COURSE During the patient's hospitalization, patient had extensive initial psychiatric evaluation, and follow-up psychiatric evaluations every day. Psychiatric diagnoses provided upon initial assessment were as follows: Principal Problem:   Bipolar 2 disorder, major depressive episode ([place]) Active Problems:   Hyperlipidemia   Hypertension, essential, benign   Insomnia   GAD (generalized anxiety disorder)  Patient's medications were adjusted on admission as follows: -Changed a.m. Seroquel to 200 mg immediate release in the morning and afternoon for mood stabilization/anxiety (EKG from 10/25 with QTc of 402 and nonspecific T wave abnormalities.  Repeat EKG ST, otherwise WNL with QTC 430) -Continued p.m. Seroquel 400 mg XL at bedtime for mood stabilization (home med) -Started Hydroxyzine 25 mg every 6 hours PRN for anxiety -Continued lisinopril 40 mg daily (home med) -Continued omega-3 daily for hyperlipidemia-educated on the need to follow-up with cardiologist after this hospitalization due to elevated lipids -Continued vitamin D3 1000 mg daily -Continued Norvasc 2.5 mg daily for hypertension (home med) -Started albuterol inhaler every 6 hours as needed  for SOB/wheezing -Started Flonase daily as needed for seasonal allergic rhinitis  During the hospitalization, other adjustments were made to the patient's psychiatric medication regimen. Medications at time of discharge are as listed above with the exception of Norvasc which was increased to 5 mg daily for hypertension.    Patient's care was discussed during the interdisciplinary team meeting every day during the hospitalization. The patient denies having side effects to prescribed psychiatric medication. Gradually, patient started adjusting to milieu. The patient was evaluated each day by a clinical provider to ascertain response to treatment. Improvement was noted by the patient's report of decreasing symptoms, improved sleep and appetite, affect, medication tolerance, behavior, and participation in unit programming.  Patient was asked each day to complete a self inventory noting mood, mental status, pain, new symptoms, anxiety and concerns.    Symptoms were reported as significantly decreased or resolved completely by discharge. On day of discharge, the patient reports that their mood is stable. The patient denied having suicidal thoughts for more than 48 hours prior to discharge.  Patient denies having homicidal thoughts.  Patient denies having auditory hallucinations.  Patient denies any visual hallucinations or other symptoms of psychosis. The patient was motivated to continue taking medication with a goal of continued improvement in mental health.   The patient reports their target psychiatric symptoms of depression, anxiety & insomnia responded well to the psychiatric medications, and the patient reports overall benefit from this psychiatric hospitalization. Supportive psychotherapy was provided to the patient. The patient also  participated in regular group therapy while hospitalized. Coping skills, problem solving as well as relaxation therapies were also part of the unit programming.  Labs were  reviewed with the patient, and abnormal results were discussed with the patient.CMP within normal limits, lipid panel with cholesterol 292, HDL 39, LDL- 231, patient educated on healthy food choices and exercise, agreeable to following this up with his cardiologist after discharge for a possible order of antilipid medications.  CBC WNL, hemoglobin A1c WNL, TSH WNL. EKG with Sinus tach and Qtc 430. Vitamin D slightly low at 25.37. Supplementing with Vitamin D 50,000 units weekly. Educated on the need to continue taking this medication after discharge and to f/u with PCP after discharge. Vitamin B-12 WNL.  Baseline UA with glucose and ketones, but otherwise WNL .  Urine tox screen + Benzos and +THC. Given Ativan at [facility] prior to urine collection.  MoCA testing ordered due to concerns of memory loss and WNL.  Total Time spent with patient: 30 minutes  Musculoskeletal: Strength & Muscle Tone: within normal limits Gait & Station: normal Patient leans: N/A  Psychiatric Specialty Exam  Presentation  General Appearance:  Appropriate for Environment; Fairly Groomed  Eye Contact: Fair  Speech: Clear and Coherent  Speech Volume: Normal  Handedness: Right  Mood and Affect  Mood: Euthymic  Duration of Depression Symptoms: No data recorded Affect: Appropriate; Congruent  Thought Process  Thought Processes: Coherent  Descriptions of Associations:Intact  Orientation:Full (Time, Place and Person)  Thought Content:Logical  History of Schizophrenia/Schizoaffective disorder:No data recorded Duration of Psychotic Symptoms:No data recorded Hallucinations:Hallucinations: None  Ideas of Reference:None  Suicidal Thoughts:Suicidal Thoughts: No  Homicidal Thoughts:Homicidal Thoughts: No  Sensorium  Memory: Immediate Good  Judgment: Good  Insight: Good  Executive Functions  Concentration: Good  Attention Span: Good  Recall: Good  Fund of  Knowledge: Good  Language: Good  Psychomotor Activity  Psychomotor Activity: Psychomotor Activity: Normal  Assets  Assets: Communication Skills; Housing; Social Support  Sleep  Sleep: Sleep: Good  Physical Exam: Physical Exam Constitutional:      Appearance: Normal appearance.  HENT:     Head: Normocephalic.     Nose: Nose normal. No congestion or rhinorrhea.  Eyes:     Pupils: Pupils are equal, round, and reactive to light.  Pulmonary:     Effort: Pulmonary effort is normal. No respiratory distress.  Musculoskeletal:        General: Normal range of motion.     Cervical back: Normal range of motion.  Neurological:     Mental Status: He is alert and oriented to person, place, and time.  Psychiatric:        Thought Content: Thought content normal.    Review of Systems  Constitutional:  Negative for fever.  HENT: Negative.  Negative for hearing loss.   Eyes: Negative.   Respiratory:  Negative for cough.   Cardiovascular:  Negative for chest pain.  Gastrointestinal:  Negative for heartburn.  Genitourinary: Negative.  Negative for dysuria.  Skin:  Negative for rash.  Psychiatric/Behavioral:  Positive for depression (Currently denies SI, denies HI, denies AVH, denies paranoia and verbally contracts for safety outside of this [facility]) and substance abuse (Educated on the need to cease using THC). Negative for hallucinations, memory loss and suicidal ideas. The patient is nervous/anxious (significantly resolved since admission) and has insomnia (significantly reduced since admission).    Blood pressure (!) 148/95, pulse (!) 102, temperature 97.8 F (36.6 C), temperature source Oral, resp. rate 20, height  5' 9.5" (1.765 m), weight 79.4 kg, SpO2 98 %. Body mass index is 25.47 kg/m.-Educated on the need to f/u with her PCP regarding elevated BP.  Mental Status Per Nursing Assessment::   On Admission:  Suicidal ideation indicated by patient, Suicide plan, Self-harm  thoughts  Demographic Factors:  Male, Caucasian, and Unemployed  Loss Factors: NA  Historical Factors: Impulsivity  Risk Reduction Factors:   Sense of responsibility to family, Living with another person, especially a relative, Positive social support, and Positive therapeutic relationship  Continued Clinical Symptoms:  Patient denies SI, denies HI, denies AVH, denies paranoia and there is no evidence of delusional thinking. Pt is verbally contracting for safety outside of this [facility].  Cognitive Features That Contribute To Risk:  None    Suicide Risk:  Mild:  There are no identifiable suicide plans, no associated intent, mild dysphoria and related symptoms, good self-control (both objective and subjective assessment), few other risk factors, and identifiable protective factors, including available and accessible social support.    Follow-up Information     [facility] Follow up on 07/21/2022.   Specialty: Behavioral Health Why: You have an appointment for therapy service on 07/21/22 at 4:00 pm     You also have an appointment for medication management services on 07/28/22 at 10:00 am.  The appointments will be held in person. Contact information: [address] [place] [phone]               Plan Of Care/Follow-up recommendations:  The patient is able to verbalize their individual safety plan to this provider.  # It is recommended to the patient to continue psychiatric medications as prescribed, after discharge from the hospital.    # It is recommended to the patient to follow up with your outpatient psychiatric provider and PCP.  # It was discussed with the patient, the impact of alcohol, drugs, tobacco have been there overall psychiatric and medical wellbeing, and total abstinence from substance use was recommended the patient.ed.  # Prescriptions provided or sent directly to preferred pharmacy at  discharge. Patient agreeable to plan. Given opportunity to ask questions. Appears to feel comfortable with discharge.    # In the event of worsening symptoms, the patient is instructed to call the crisis hotline (988), 911 and or go to the nearest ED for appropriate evaluation and treatment of symptoms. To follow-up with primary care provider for other medical issues, concerns and or health care needs  # Patient was discharged home with a plan to follow up as noted above.   Nicholes Rough, NP 07/11/2022, 10:39 AM

## 2022-07-11 NOTE — Discharge Summary (Signed)
Physician Discharge Summary Note  Patient:  Chase Hunter. is an 61 y.o., male MRN:  [id number] DOB:  24-Aug-1961 Patient phone:  [phone] (home)  Patient address:   [address] [place] [address],  Total Time spent with patient: 30 minutes  Date of Admission:  07/07/2022 Date of Discharge: 07/11/2022  Reason for Admission:  Chase Hunter. Chase Hunter. Is a 62 yo Caucasian male with past mental health diagnoses of Bipolar 2 d/o & GAD who presented to this [facility] on 07/07/22 with complaints of worsening depressive symptoms and +SI with a plan to hang himself after a verbal altercation with his wife. Pt was admitted voluntarily for treatment and stabilization of his mood.      Principal Problem: Bipolar 2 disorder [facility]) Discharge Diagnoses: Principal Problem:   Bipolar 2 disorder ([place]) Active Problems:   Hyperlipidemia   Hypertension, essential, benign   Insomnia   GAD (generalized anxiety disorder)  Past Psychiatric History: Bipolar 2 d/o  Past Medical History:  Past Medical History:  Diagnosis Date   Allergy    Anemia    Asthma    Bipolar II disorder ([place])    Depression    Gallstones    GERD (gastroesophageal reflux disease)    Hx of adenomatous colonic polyps    Hyperlipidemia    Hypertension    Pancreatitis    Seizures ([place])    past hx 6 yrs ago x 1- coming off meds caused 1 seizure     Past Surgical History:  Procedure Laterality Date   COLONOSCOPY     ESOPHAGOGASTRODUODENOSCOPY N/A 07/19/2020   Procedure: ESOPHAGOGASTRODUODENOSCOPY (EGD);  Surgeon: Milus Banister, MD;  Location: [facility];  Service: Endoscopy;  Laterality: N/A;   EUS N/A 07/19/2020   Procedure: UPPER ENDOSCOPIC ULTRASOUND (EUS) RADIAL;  Surgeon: Milus Banister, MD;  Location: [facility];  Service: Endoscopy;  Laterality: N/A;   POLYPECTOMY     Family History:  Family History  Problem Relation Age of Onset   Atrial fibrillation Mother    Anxiety disorder Mother     Obesity Mother    Mood Disorder Mother    Anxiety disorder Father    Prostate cancer Father    Brain cancer Father    Anxiety disorder Brother    Anxiety disorder Daughter    Colon cancer Neg Hx    Colon polyps Neg Hx    Esophageal cancer Neg Hx    Rectal cancer Neg Hx    Stomach cancer Neg Hx    Family Psychiatric  History: As above Social History:  Social History   Substance and Sexual Activity  Alcohol Use Never     Social History   Substance and Sexual Activity  Drug Use Not Currently    Social History   Socioeconomic History   Marital status: Married    Spouse name: Not on file   Number of children: 1   Years of education: Not on file   Highest education level: Not on file  Occupational History   Occupation: retired Pharmacist, hospital  Tobacco Use   Smoking status: Never   Smokeless tobacco: Never  Substance and Sexual Activity   Alcohol use: Never   Drug use: Not Currently   Sexual activity: Not on file  Other Topics Concern   Not on file  Social History Narrative   Not on file   Social Determinants of Health   Financial Resource Strain: Low Risk  (06/14/2021)   Overall Financial Resource Strain (CARDIA)  Difficulty of Paying Living Expenses: Not hard at all  Food Insecurity: No Food Insecurity (07/07/2022)   Hunger Vital Sign    Worried About Running Out of Food in the Last Year: Never true    Ran Out of Food in the Last Year: Never true  Transportation Needs: No Transportation Needs (07/07/2022)   PRAPARE - Hydrologist (Medical): No    Lack of Transportation (Non-Medical): No  Physical Activity: Insufficiently Active (06/14/2021)   Exercise Vital Sign    Days of Exercise per Week: 3 days    Minutes of Exercise per Session: 40 min  Stress: Stress Concern Present (06/14/2021)   [place]    Feeling of Stress : Very much  Social Connections: Moderately  Integrated (06/14/2021)   Social Connection and Isolation Panel [NHANES]    Frequency of Communication with Friends and Family: Three times a week    Frequency of Social Gatherings with Friends and Family: Three times a week    Attends Religious Services: Never    Active Member of Clubs or Organizations: Yes    Attends Music therapist: More than 4 times per year    Marital Status: Married                                              HOSPITAL COURSE During the patient's hospitalization, patient had extensive initial psychiatric evaluation, and follow-up psychiatric evaluations every day. Psychiatric diagnoses provided upon initial assessment were as follows: Principal Problem:   Bipolar 2 disorder, major depressive episode ([place]) Active Problems:   Hyperlipidemia   Hypertension, essential, benign   Insomnia   GAD (generalized anxiety disorder)   Patient's medications were adjusted on admission as follows: -Changed a.m. Seroquel to 200 mg immediate release in the morning and afternoon for mood stabilization/anxiety (EKG from 10/25 with QTc of 402 and nonspecific T wave abnormalities.  Repeat EKG ST, otherwise WNL with QTC 430) -Continued p.m. Seroquel 400 mg XL at bedtime for mood stabilization (home med) -Started Hydroxyzine 25 mg every 6 hours PRN for anxiety -Continued lisinopril 40 mg daily (home med) -Continued omega-3 daily for hyperlipidemia-educated on the need to follow-up with cardiologist after this hospitalization due to elevated lipids -Continued vitamin D3 1000 mg daily -Continued Norvasc 2.5 mg daily for hypertension (home med) -Started albuterol inhaler every 6 hours as needed for SOB/wheezing -Started Flonase daily as needed for seasonal allergic rhinitis   During the hospitalization, other adjustments were made to the patient's psychiatric medication regimen. Medications at time of discharge are as listed above with the exception of Norvasc which was  increased to 5 mg daily for hypertension.      Patient's care was discussed during the interdisciplinary team meeting every day during the hospitalization. The patient denies having side effects to prescribed psychiatric medication. Gradually, patient started adjusting to milieu. The patient was evaluated each day by a clinical provider to ascertain response to treatment. Improvement was noted by the patient's report of decreasing symptoms, improved sleep and appetite, affect, medication tolerance, behavior, and participation in unit programming.  Patient was asked each day to complete a self inventory noting mood, mental status, pain, new symptoms, anxiety and concerns.     Symptoms were reported as significantly decreased or resolved completely by discharge. On day of discharge, the  patient reports that their mood is stable. The patient denied having suicidal thoughts for more than 48 hours prior to discharge.  Patient denies having homicidal thoughts.  Patient denies having auditory hallucinations.  Patient denies any visual hallucinations or other symptoms of psychosis. The patient was motivated to continue taking medication with a goal of continued improvement in mental health.    The patient reports their target psychiatric symptoms of depression, anxiety & insomnia responded well to the psychiatric medications, and the patient reports overall benefit from this psychiatric hospitalization. Supportive psychotherapy was provided to the patient. The patient also participated in regular group therapy while hospitalized. Coping skills, problem solving as well as relaxation therapies were also part of the unit programming.   Labs were reviewed with the patient, and abnormal results were discussed with the patient.CMP within normal limits, lipid panel with cholesterol 292, HDL 39, LDL- 231, patient educated on healthy food choices and exercise, agreeable to following this up with his cardiologist after  discharge for a possible order of antilipid medications.  CBC WNL, hemoglobin A1c WNL, TSH WNL. EKG with Sinus tach and Qtc 430. Vitamin D slightly low at 25.37. Supplementing with Vitamin D 50,000 units weekly. Educated on the need to continue taking this medication after discharge and to f/u with PCP after discharge. Vitamin B-12 WNL.  Baseline UA with glucose and ketones, but otherwise WNL .  Urine tox screen + Benzos and +THC. Given Ativan at [facility] prior to urine collection.  MoCA testing ordered due to concerns of memory loss and WNL.   Physical Findings: AIMS: 0 CIWA:  n/a  COWS: n/a    Musculoskeletal: Strength & Muscle Tone: within normal limits Gait & Station: normal Patient leans: N/A  Psychiatric Specialty Exam:  Presentation  General Appearance:  Appropriate for Environment; Fairly Groomed  Eye Contact: Fair  Speech: Clear and Coherent  Speech Volume: Normal  Handedness: Right  Mood and Affect  Mood: Euthymic  Affect: Appropriate; Congruent   Thought Process  Thought Processes: Coherent  Descriptions of Associations:Intact  Orientation:Full (Time, Place and Person)  Thought Content:Logical  History of Schizophrenia/Schizoaffective disorder:No data recorded Duration of Psychotic Symptoms:No data recorded Hallucinations:Hallucinations: None  Ideas of Reference:None  Suicidal Thoughts:Suicidal Thoughts: No  Homicidal Thoughts:Homicidal Thoughts: No  Sensorium  Memory: Immediate Good  Judgment: Good  Insight: Good  Executive Functions  Concentration: Good  Attention Span: Good  Recall: Good  Fund of Knowledge: Good  Language: Good  Psychomotor Activity  Psychomotor Activity: Psychomotor Activity: Normal  Assets  Assets: Communication Skills; Housing; Social Support  Sleep  Sleep: Sleep: Good  Physical Exam: Physical Exam Constitutional:      Appearance: Normal appearance.  HENT:     Head: Normocephalic.      Nose: Nose normal. No congestion or rhinorrhea.  Eyes:     Pupils: Pupils are equal, round, and reactive to light.  Pulmonary:     Effort: Pulmonary effort is normal. No respiratory distress.  Musculoskeletal:        General: Normal range of motion.     Cervical back: Normal range of motion.  Neurological:     Mental Status: He is alert and oriented to person, place, and time.  Psychiatric:        Behavior: Behavior normal.        Thought Content: Thought content normal.    Review of Systems  Constitutional:  Negative for fever.  HENT:  Negative for hearing loss.   Eyes: Negative.  Respiratory:  Negative for cough.   Cardiovascular:  Negative for chest pain.  Gastrointestinal: Negative.   Genitourinary: Negative.   Musculoskeletal: Negative.   Skin:  Negative for rash.  Neurological:  Negative for dizziness and headaches.  Psychiatric/Behavioral:  Positive for depression (Denies SI, denies HI, Denies AVH and verbally contracts for safety outside of this [facility]) and substance abuse (Educated on [facility] use cessation). Negative for hallucinations, memory loss and suicidal ideas. The patient is nervous/anxious (significantly improved and stabilized at time of discharge) and has insomnia (significantly improved at time of discharge).    Blood pressure (!) 148/95, pulse (!) 102, temperature 97.8 F (36.6 C), temperature source Oral, resp. rate 20, height 5' 9.5" (1.765 m), weight 79.4 kg, SpO2 98 %. Body mass index is 25.47 kg/m.-Educated to f/u elevated BP with PCP and agreeable    Social History   Tobacco Use  Smoking Status Never  Smokeless Tobacco Never   Tobacco Cessation:  N/A, patient does not currently use tobacco products  Blood Alcohol level:  No results found for: "ETH"  Metabolic Disorder Labs:  Lab Results  Component Value Date   HGBA1C 5.1 07/08/2022   MPG 99.67 07/08/2022   No results found for: "PROLACTIN" Lab Results  Component Value Date   CHOL 292 (H)  07/08/2022   TRIG 109 07/08/2022   HDL 39 (L) 07/08/2022   CHOLHDL 7.5 07/08/2022   VLDL 22 07/08/2022   LDLCALC 231 (H) 07/08/2022   LDLCALC 212 (H) 11/30/2020   See Psychiatric Specialty Exam and Suicide Risk Assessment completed by Attending Physician prior to discharge.  Discharge destination:  Home  Is patient on multiple antipsychotic therapies at discharge:  No   Has Patient had three or more failed trials of antipsychotic monotherapy by history:  No  Recommended Plan for Multiple Antipsychotic Therapies: NA   Allergies as of 07/11/2022       Reactions   Cephalexin Hives   Depakote [divalproex Sodium]    Caused Pancreatitis   Trazodone And Nefazodone    Suicidal thoughts   Lamictal [lamotrigine] Rash   Had a seizure when he stopped it   Lipitor [atorvastatin] Rash   Other reaction(s): Other (See Comments) Memory issues that have not resolved        Medication List     STOP taking these medications    clonazePAM 0.5 MG tablet Commonly known as: KLONOPIN   Fish Oil 1200 MG Caps   fluticasone-salmeterol 250-50 MCG/ACT Aepb Commonly known as: ADVAIR   INSULIN SYRINGE 1CC/29G 29G X 1/2" 1 ML Misc Commonly known as: [place] Insulin Syr       TAKE these medications      Indication  albuterol 108 (90 Base) MCG/ACT inhaler Commonly known as: VENTOLIN HFA Inhale 2 puffs into the lungs every 4 (four) hours as needed for wheezing or shortness of breath. What changed:  when to take this Another medication with the same name was removed. Continue taking this medication, and follow the directions you see here.  Indication: Asthma   amLODipine 5 MG tablet Commonly known as: NORVASC Take 1 tablet (5 mg total) by mouth daily. Start taking on: July 12, 2022 What changed:  how much to take when to take this  Indication: High Blood Pressure Disorder   [place] 15 15-90.314 MG Caps Take 15 mg by mouth daily.    fluticasone 50 MCG/ACT nasal  spray Commonly known as: FLONASE Place 2 sprays into both nostrils 2 (two) times daily  as needed for allergies or rhinitis. What changed:  how much to take when to take this reasons to take this  Indication: Allergic Rhinitis   hydrOXYzine 25 MG tablet Commonly known as: ATARAX Take 1 tablet (25 mg total) by mouth 3 (three) times daily as needed for anxiety.  Indication: Feeling Anxious   lisinopril 40 MG tablet Commonly known as: ZESTRIL Take 1 tablet (40 mg total) by mouth daily.  Indication: High Blood Pressure Disorder   omega-3 acid ethyl esters 1 g capsule Commonly known as: LOVAZA Take 1 capsule (1 g total) by mouth daily. Start taking on: July 12, 2022  Indication: High Amount of Triglycerides in the Blood   QUEtiapine 400 MG 24 hr tablet Commonly known as: SEROQUEL XR Take 1 tablet (400 mg total) by mouth at bedtime. What changed: when to take this  Indication: Manic-Depression   QUEtiapine 200 MG tablet Commonly known as: SEROQUEL Take 1 tablet (200 mg total) by mouth 2 (two) times daily. What changed: You were already taking a medication with the same name, and this prescription was added. Make sure you understand how and when to take each.  Indication: Generalized Anxiety Disorder, Major Depressive Disorder   testosterone cypionate 200 MG/ML injection Commonly known as: DEPOTESTOSTERONE CYPIONATE Inject 0.5 mLs (100 mg total) into the muscle once a week.  Indication: Deficient Activity of the Testis   Vitamin D (Ergocalciferol) 1.25 MG (50000 UNIT) Caps capsule Commonly known as: DRISDOL Take 1 capsule (50,000 Units total) by mouth every 7 (seven) days. Start taking on: July 16, 2022  Indication: Vitamin D Deficiency   vitamin D3 25 MCG tablet Commonly known as: CHOLECALCIFEROL Take 1 tablet (1,000 Units total) by mouth daily. Start taking on: July 12, 2022 What changed:  medication strength how much to take  Indication: Vitamin D  Deficiency        Follow-up Information     [facility] Follow up on 07/21/2022.   Specialty: Behavioral Health Why: You have an appointment for therapy service on 07/21/22 at 4:00 pm     You also have an appointment for medication management services on 07/28/22 at 10:00 am.  The appointments will be held in person. Contact information: [address], [place] [phone]               Follow-up recommendations:   The patient is able to verbalize their individual safety plan to this provider.   # It is recommended to the patient to continue psychiatric medications as prescribed, after discharge from the hospital.     # It is recommended to the patient to follow up with your outpatient psychiatric provider and PCP.   # It was discussed with the patient, the impact of alcohol, drugs, tobacco have been there overall psychiatric and medical wellbeing, and total abstinence from substance use was recommended the patient.ed.   # Prescriptions provided or sent directly to preferred pharmacy at discharge. Patient agreeable to plan. Given opportunity to ask questions. Appears to feel comfortable with discharge.    # In the event of worsening symptoms, the patient is instructed to call the crisis hotline (988), 911 and or go to the nearest ED for appropriate evaluation and treatment of symptoms. To follow-up with primary care provider for other medical issues, concerns and or health care needs   # Patient was discharged home with a plan to follow up as noted above.    Signed: Nicholes Rough,  NP 07/11/2022, 12:59 PM

## 2022-07-11 NOTE — Progress Notes (Signed)
  [facility] Adult Case Management Discharge Plan :  Will you be returning to the same living situation after discharge:  Yes,  Home with wife At discharge, do you have transportation home?: Yes,  Wife  Do you have the ability to pay for your medications: Yes,  Medicare   Release of information consent forms completed and in the chart;  Patient's signature needed at discharge.  Patient to Follow up at:  Follow-up Information     [facility] Follow up on 07/21/2022.   Specialty: Behavioral Health Why: You have an appointment for therapy service on 07/21/22 at 4:00 pm     You also have an appointment for medication management services on 07/28/22 at 10:00 am.  The appointments will be held in person. Contact information: [address], [place] [phone]                Next level of care provider has access to [place] and Suicide Prevention discussed: Yes,  with patient and wife      Has patient been referred to the Quitline?: N/A patient is not a smoker  Patient has been referred for addiction treatment: N/A  Darleen Crocker, LCSWA 07/11/2022, 9:49 AM

## 2022-07-11 NOTE — Telephone Encounter (Signed)
A Rx for Hydrozyzine was already submitted to his pharmacy by the hospital doctor. Pt should be able to get that Rx then has follow up with [name] on Wednesday.

## 2022-07-11 NOTE — Progress Notes (Signed)
   07/10/22 2100  Psych Admission Type (Psych Patients Only)  Admission Status Voluntary  Psychosocial Assessment  Patient Complaints Anxiety  Eye Contact Fair  Facial Expression Flat  Affect Anxious  Speech Logical/coherent  Interaction Assertive  Motor Activity Other (Comment) (WNL)  Appearance/Hygiene Unremarkable  Behavior Characteristics Cooperative  Mood Anxious  Thought Process  Coherency WDL  Content WDL  Delusions None reported or observed  Perception WDL  Hallucination None reported or observed  Judgment Impaired  Confusion WDL  Danger to Self  Current suicidal ideation? Denies  Agreement Not to Harm Self Yes  Description of Agreement Verbal  Danger to Others  Danger to Others None reported or observed

## 2022-07-14 ENCOUNTER — Ambulatory Visit: Payer: Medicare PPO | Admitting: Psychiatry

## 2022-07-15 ENCOUNTER — Other Ambulatory Visit: Payer: Self-pay | Admitting: Psychiatry

## 2022-07-15 DIAGNOSIS — D225 Melanocytic nevi of trunk: Secondary | ICD-10-CM | POA: Diagnosis not present

## 2022-07-15 DIAGNOSIS — D492 Neoplasm of unspecified behavior of bone, soft tissue, and skin: Secondary | ICD-10-CM | POA: Diagnosis not present

## 2022-07-15 DIAGNOSIS — C44329 Squamous cell carcinoma of skin of other parts of face: Secondary | ICD-10-CM | POA: Diagnosis not present

## 2022-07-15 DIAGNOSIS — L821 Other seborrheic keratosis: Secondary | ICD-10-CM | POA: Diagnosis not present

## 2022-07-15 DIAGNOSIS — L570 Actinic keratosis: Secondary | ICD-10-CM | POA: Diagnosis not present

## 2022-07-15 DIAGNOSIS — L814 Other melanin hyperpigmentation: Secondary | ICD-10-CM | POA: Diagnosis not present

## 2022-07-16 ENCOUNTER — Ambulatory Visit (INDEPENDENT_AMBULATORY_CARE_PROVIDER_SITE_OTHER): Payer: Medicare PPO | Admitting: Psychiatry

## 2022-07-16 ENCOUNTER — Encounter: Payer: Self-pay | Admitting: Psychiatry

## 2022-07-16 DIAGNOSIS — F411 Generalized anxiety disorder: Secondary | ICD-10-CM

## 2022-07-16 DIAGNOSIS — F410 Panic disorder [episodic paroxysmal anxiety] without agoraphobia: Secondary | ICD-10-CM | POA: Diagnosis not present

## 2022-07-16 DIAGNOSIS — F3181 Bipolar II disorder: Secondary | ICD-10-CM

## 2022-07-16 MED ORDER — GABAPENTIN 100 MG PO CAPS: 100.0000 mg | ORAL_CAPSULE | Freq: Three times a day (TID) | ORAL | 0 refills | Status: DC

## 2022-07-16 MED ORDER — QUETIAPINE FUMARATE ER 400 MG PO TB24: 400.0000 mg | ORAL_TABLET | Freq: Every day | ORAL | 0 refills | Status: DC

## 2022-07-16 MED ORDER — QUETIAPINE FUMARATE 200 MG PO TABS: 200.0000 mg | ORAL_TABLET | Freq: Two times a day (BID) | ORAL | 0 refills | Status: DC

## 2022-07-16 MED ORDER — HYDROXYZINE HCL 25 MG PO TABS: 25.0000 mg | ORAL_TABLET | Freq: Three times a day (TID) | ORAL | 0 refills | Status: DC | PRN

## 2022-07-16 NOTE — Progress Notes (Signed)
Chase Hunter Chase Hunter [id number] 05/07/1961 61 y.o.  Subjective:   Patient ID:  Chase Hunter. is a 61 y.o. (DOB Oct 19, 1960) male.  Chief Complaint:  Chief Complaint  Patient presents with   Hospitalization Follow-up   Anxiety    HPI Chase Hunter. presents to the office today for hospital discharge follow-up of anxiety and h/o Bipolar disorder.   He reports that he walked to [facility] after having suicidal thoughts about hanging himself. He  reports that he was in a holding room for 6 hours at [facility] and then was admitted to [facility]. Seroquel XR was changed to IR 200 mg in the morning and mid-day and Seroquel XR 400 mg was continued at bedtime. He reports that this has been somewhat helpful "for certain symptoms- my racing thoughts, my intrusive thoughts" and not for anxiety.   He reports that he had "the worst panic attack of my life yesterday." He reports that his heart has been racing. "It's the stress. It's worse now." He reports that he awakens with increased HR and feeling "frightened." He reports that hydroxyzine "takes the edge off for a little bit" and then causes excessive somnolence. He reports that he has been using Delta 8 THC at times to get relief from anxiety. He reports that his anxiety has been elevated since trip to [place]. He reports that he has anxiety in social situations and has been avoiding these situations and will likely not go to Thanksgiving meal hosted by son-in-law's family.  Denies current depression. "I haven't been depressed for a long time." He reports, "I don't know" if he is experiencing irritability. Sleeping well with Seroquel. He has had increased appetite since returning home from the hospital. Difficulty with concentration. Denies SI.   He has eliminated caffeine. He has decided to increase physical activity and join [employer] with his wife.   He reports that wife has been supportive and he has shared his symptoms  with her.   Past Psychiatric Medication Trials: Lamictal- Rash. Had seizure when it was stopped.  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 Abilify- cannot recall response. Minimal improvement. Seroquel XR-Affective dulling, excessive somnolence. Took up to 600 mg QHS Seroquel Saphris- SI, uncontrolled crying Latuda Trazodone- Increased BP and suicidal thoughts Propranolol- Raised BP Clonidine- increased panic, crying, and SI Sertraline- Worsening mood symptoms and anxiety Lexapro Wellbutrin Duloxetine-excessively talkative. "Saying things out loud that I thought I was just thinking."  Buspar-Increased anxiety and increased BP.  Klonopin Diazepam- Paradoxical effect Hydroxyzine    AIMS    Flowsheet Row Office Visit from 07/16/2022 in [place] Office Visit from 03/21/2022 in [place] Office Visit from 10/09/2021 in [place] Visit from 08/12/2021 in [place] Total Score 0 0 0 0      PHQ2-9    [place] Office Visit from 08/16/2021 in [place] at [facility] from 06/14/2021 in [place] at [place] from 06/13/2020 in [place] at [facility] Total Score 5 2 0  PHQ-9 Total Score 20 12 --      Flowsheet Row ED from 08/14/2021 in [place] from 06/14/2021 in [place] at [facility] No Risk Error: Q3, 4, or 5 should not be populated when Q2 is No        Review of Systems:  Review of Systems  Cardiovascular:  Positive for palpitations. Negative for chest pain.       He has completed 10 day heart monitoring and has returned this to cardiology.   Gastrointestinal: Negative.   Musculoskeletal:  Negative for gait problem.  Neurological:  Negative for tremors.   Psychiatric/Behavioral:         Please refer to HPI    Medications: I have reviewed the patient's current medications.  Current Outpatient Medications  Medication Sig Dispense Refill   amLODipine (NORVASC) 5 MG tablet Take 1 tablet (5 mg total) by mouth daily. 30 tablet 0   fluticasone (FLONASE) 50 MCG/ACT nasal spray Place 2 sprays into both nostrils 2 (two) times daily as needed for allergies or rhinitis. 9.9 mL 0   gabapentin (NEURONTIN) 100 MG capsule Take 1 capsule (100 mg total) by mouth 3 (three) times daily. 90 capsule 0   lisinopril (ZESTRIL) 40 MG tablet Take 1 tablet (40 mg total) by mouth daily. 30 tablet 0   omega-3 acid ethyl esters (LOVAZA) 1 g capsule Take 1 capsule (1 g total) by mouth daily. 30 capsule 0   testosterone cypionate (DEPOTESTOSTERONE CYPIONATE) 200 MG/ML injection Inject 0.5 mLs (100 mg total) into the muscle once a week. 2.5 mL 3   Vitamin D, Ergocalciferol, (DRISDOL) 1.25 MG (50000 UNIT) CAPS capsule Take 1 capsule (50,000 Units total) by mouth every 7 (seven) days. 5 capsule 0   vitamin D3 (CHOLECALCIFEROL) 25 MCG tablet Take 1 tablet (1,000 Units total) by mouth daily. 30 tablet 0   albuterol (VENTOLIN HFA) 108 (90 Base) MCG/ACT inhaler Inhale 2 puffs into the lungs every 4 (four) hours as needed for wheezing or shortness of breath. 1 each 0   hydrOXYzine (ATARAX) 25 MG tablet Take 1 tablet (25 mg total) by mouth 3 (three) times daily as needed for anxiety. 30 tablet 0   L-Methylfolate-Algae (DEPLIN 15) 15-90.314 MG CAPS Take 15 mg by mouth daily. 90 capsule 3   QUEtiapine (SEROQUEL XR) 400 MG 24 hr tablet Take 1 tablet (400 mg total) by mouth at bedtime. 30 tablet 0   QUEtiapine (SEROQUEL) 200 MG tablet Take 1 tablet (200 mg total) by mouth 2 (two) times daily. 60 tablet 0   No current facility-administered medications for this visit.    Medication Side Effects: Sedation  Allergies:  Allergies  Allergen Reactions   Cephalexin Hives   Depakote  [Divalproex Sodium]     Caused Pancreatitis   Trazodone And Nefazodone     Suicidal thoughts   Lamictal [Lamotrigine] Rash    Had a seizure when he stopped it   Lipitor [Atorvastatin] Rash    Other reaction(s): Other (See Comments) Memory issues that have not resolved    Past Medical History:  Diagnosis Date   Allergy    Anemia    Asthma    Bipolar II disorder ([place])    Depression    Gallstones    GERD (gastroesophageal reflux disease)    Hx of adenomatous colonic polyps    Hyperlipidemia    Hypertension    Pancreatitis    Seizures (HCC)    past hx 6 yrs ago x 1- coming off meds caused 1 seizure     Past Medical History, Surgical history, Social history, and Family history were reviewed and updated as appropriate.   Please see review of systems for further details on the patient's review from today.   Objective:   Physical Exam:  There were no vitals taken  for this visit.  Physical Exam Constitutional:      General: He is not in acute distress. Musculoskeletal:        General: No deformity.  Neurological:     Mental Status: He is alert and oriented to person, place, and time.     Coordination: Coordination normal.  Psychiatric:        Attention and Perception: Attention and perception normal. He does not perceive auditory or visual hallucinations.        Mood and Affect: Mood is anxious. Mood is not depressed. Affect is not labile, blunt, angry or inappropriate.        Speech: Speech normal.        Behavior: Behavior is cooperative.        Thought Content: Thought content normal. Thought content is not paranoid or delusional. Thought content does not include homicidal or suicidal ideation. Thought content does not include homicidal or suicidal plan.        Cognition and Memory: Cognition and memory normal.        Judgment: Judgment normal.     Comments: Insight intact Restless     Lab Review:     Component Value Date/Time   NA 139 07/08/2022 0646   K 4.1  07/08/2022 0646   CL 104 07/08/2022 0646   CO2 24 07/08/2022 0646   GLUCOSE 102 (H) 07/08/2022 0646   BUN 20 07/08/2022 0646   CREATININE 0.90 07/08/2022 0646   CREATININE 0.96 05/14/2020 0806   CALCIUM 9.4 07/08/2022 0646   PROT 7.6 07/08/2022 0646   ALBUMIN 4.6 07/08/2022 0646   AST 19 07/08/2022 0646   ALT 19 07/08/2022 0646   ALKPHOS 53 07/08/2022 0646   BILITOT 1.1 07/08/2022 0646   GFRNONAA >60 07/08/2022 0646   GFRNONAA 87 05/14/2020 0806   GFRAA 101 05/14/2020 0806       Component Value Date/Time   WBC 9.4 07/08/2022 0646   RBC 5.04 07/08/2022 0646   HGB 15.7 07/08/2022 0646   HCT 46.4 07/08/2022 0646   PLT 232 07/08/2022 0646   MCV 92.1 07/08/2022 0646   MCH 31.2 07/08/2022 0646   MCHC 33.8 07/08/2022 0646   RDW 11.9 07/08/2022 0646   LYMPHSABS 1.9 08/14/2021 1415   MONOABS 0.5 08/14/2021 1415   EOSABS 0.1 08/14/2021 1415   BASOSABS 0.1 08/14/2021 1415    No results found for: "POCLITH", "LITHIUM"   Lab Results  Component Value Date   VALPROATE 33.1 (L) 07/15/2019     .res Assessment: Plan:    Pt seen for 45 minutes and time spent reviewing hospital record, discussing hospitalization, and evaluating response to medication changes. He reports that his anxiety remains elevated and asks about possible treatment options. Discussed potential benefits, risks, and side effects of Gabapentin for off-label indication of anxiety. Pt agrees to trial of Gabapentin. Will start Gabapentin 100 mg po TID for anxiety. Discussed starting with low dose and titrating based on response and tolerability.  Will continue Seroquel 200 mg in the morning and mid-day for mood and anxiety since he reports that this has been partially effective.  Will continue Seroquel XR 400 mg in the evening for mood and anxiety.  Will continue Hydroxyzine 25 mg po TID prn anxiety since he reports that this has been helpful for his anxiety since hospitalization.  Discussed safety plan at length, to  include other facilities where he could receive emergency care if he experiences suicidal intent, since he reports that he would likely  not seek care at the same facility in the future.  Also discussed potential benefits of a partial hospitalization program for more intensive treatment. Discussed that [facility] has a virtual partial hospitalization program and [facility] has an in person hospitalization program. He reports that he will consider a partial hospitalization program and may contact programs to gather more information.  He has an apt to see Rinaldo Cloud, LCSW on 07/21/22. Pt to follow-up with this provider in 2 weeks or sooner if clinically indicated.  Patient advised to contact office with any questions, adverse effects, or acute worsening in signs and symptoms.   Bradshaw was seen today for hospitalization follow-up and anxiety.  Diagnoses and all orders for this visit:  Generalized anxiety disorder -     gabapentin (NEURONTIN) 100 MG capsule; Take 1 capsule (100 mg total) by mouth 3 (three) times daily. -     hydrOXYzine (ATARAX) 25 MG tablet; Take 1 tablet (25 mg total) by mouth 3 (three) times daily as needed for anxiety. -     QUEtiapine (SEROQUEL XR) 400 MG 24 hr tablet; Take 1 tablet (400 mg total) by mouth at bedtime. -     QUEtiapine (SEROQUEL) 200 MG tablet; Take 1 tablet (200 mg total) by mouth 2 (two) times daily.  Bipolar II disorder (HCC) -     QUEtiapine (SEROQUEL XR) 400 MG 24 hr tablet; Take 1 tablet (400 mg total) by mouth at bedtime. -     QUEtiapine (SEROQUEL) 200 MG tablet; Take 1 tablet (200 mg total) by mouth 2 (two) times daily.  Panic     Please see After Visit Summary for patient specific instructions.  Future Appointments  Date Time Provider [place]  07/21/2022  4:00 PM Shanon Ace, [place] [facility] None  07/28/2022 10:00 AM Thayer Headings, PMHNP CP-CP None  08/04/2022  8:00 AM Shanon Ace, LCSW CP-CP None  08/13/2022 11:30 AM [facility] 1 [facility]  [facility]  09/25/2022  2:00 PM Nahser, Wonda Cheng, MD [facility]    No orders of the defined types were placed in this encounter.   -------------------------------

## 2022-07-17 DIAGNOSIS — E782 Mixed hyperlipidemia: Secondary | ICD-10-CM | POA: Diagnosis not present

## 2022-07-17 DIAGNOSIS — R000 Tachycardia, unspecified: Secondary | ICD-10-CM | POA: Diagnosis not present

## 2022-07-21 ENCOUNTER — Ambulatory Visit: Payer: Medicare PPO | Admitting: Psychiatry

## 2022-07-21 DIAGNOSIS — F411 Generalized anxiety disorder: Secondary | ICD-10-CM

## 2022-07-21 NOTE — Progress Notes (Signed)
[facility] Counselor/Therapist Progress Note  Patient ID: Chase Hunter., MRN: [id number],    Date: 07/21/2022  Time Spent: 50 minutes   Treatment Type: Individual Therapy  Reported Symptoms: anxiety  Mental Status Exam:  Appearance:   Casual     Behavior:  Appropriate, Sharing, and Motivated  Motor:  Normal  Speech/Language:   Clear and Coherent  Affect:  anxious  Mood:  anxious  Thought process:  goal directed  Thought content:    WNL  Sensory/Perceptual disturbances:    WNL  Orientation:  oriented to person, place, time/date, situation, day of week, month of year, year, and stated date of Nov. 20, 2023  Attention:  Good  Concentration:  Good  Memory:  WNL  Fund of knowledge:   Good  Insight:    Good  Judgment:   Good  Impulse Control:  Good   Risk Assessment: Danger to Self:  No Self-injurious Behavior: No Danger to Others: No Duty to Warn:no Physical Aggression / Violence:No  Access to Firearms a concern: No  Gang Involvement:No   Subjective: Patient in today and reporting frustrations from recent hospitalization locally. States he was frustrated by recent trips with wife who ended up saying some "pointed" things to patient which frustrated and angered him. He ended up walking to behavioral health hospital and was admitted. Currently, has added new med  Gabapentin and feels it is helping. Denies any SI. Reports improved self-worth and discussed this in more depth during session. Communication with wife is better. List of triggers: feeling like I have to go on family trips that I don't want to go on, failing to regard my self-worth (my mother use to with-hold her approval of me and I don't want my wife doing that). 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 Processed more today and seemed to be feeling some better and trying to move forward. 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 Reports trying not to fixate on things, and putting bad experiences behind me.  Interventions: Cognitive Behavioral Therapy and Ego-Supportive  Treatment goals: Treatment goals remain on treatment plan as patient works with strategies to achieve his goals.  Progress is assessed each session and documented in the "subject" and/or "plan" sections of treatment note. Long-term goal: Reduce overall level, frequency, and intensity of the depression and anxiety so that daily functioning is not impaired. Short-term goal: Verbalize an understanding of the role that fearful thinking plays in creating fears, excessive worry, and persistent anxiety/depressive symptoms. Strategies: Identify, challenge, and replace fearful self-talk with positive, realistic, and empowering self-talk.     Diagnosis:   ICD-10-CM   1. Generalized anxiety disorder  F41.1      Plan: Patient today actively participating in session as he worked on moving beyond recent hospitalization that he was concerned by and did not find helpful. Currently experiencing anxiety but less anxious after processing his disappointing hospital experience in session today but felt he needed to process it. Feeling more calm now and that he is moving on. Continues to deny any SI. "Monitoring my state and interaction with the world, I do that constantly." Encouraged his continued healthy boundaries with people and make decisions that are healthy for him. Encouraged patient in his practice of more positive behaviors as discussed in session including: Staying in touch with people who are supportive, remaining in  the present and focus on what he can change versus cannot, work with strategies for decreasing anxious thoughts, trying not to look for "things to worry about", look for more positives versus negatives daily, stay on his prescribed medication, remain in contact with others that feel comfortable to  him, healthy nutrition and exercise, increase his positive self talk, and realize the strength he shows when working with goal-directed behaviors to move forward in a direction that supports his improved emotional health and overall outlook.  Goal review and progress/challenges noted with patient.  Next appointment within 2 weeks.  This record has been created using [employer].  Chart creation errors have been sought, but may not always have been located and corrected.  Such creation errors do not reflect on the standard of medical care provided.   Shanon Ace, LCSW

## 2022-07-28 ENCOUNTER — Ambulatory Visit: Payer: Medicare PPO | Admitting: Psychiatry

## 2022-07-28 ENCOUNTER — Encounter: Payer: Self-pay | Admitting: Psychiatry

## 2022-07-28 ENCOUNTER — Ambulatory Visit (INDEPENDENT_AMBULATORY_CARE_PROVIDER_SITE_OTHER): Payer: Medicare PPO | Admitting: Psychiatry

## 2022-07-28 DIAGNOSIS — E559 Vitamin D deficiency, unspecified: Secondary | ICD-10-CM

## 2022-07-28 DIAGNOSIS — F411 Generalized anxiety disorder: Secondary | ICD-10-CM

## 2022-07-28 DIAGNOSIS — F3181 Bipolar II disorder: Secondary | ICD-10-CM | POA: Diagnosis not present

## 2022-07-28 MED ORDER — QUETIAPINE FUMARATE 200 MG PO TABS: 200.0000 mg | ORAL_TABLET | Freq: Two times a day (BID) | ORAL | 0 refills | Status: DC

## 2022-07-28 MED ORDER — VITAMIN D (ERGOCALCIFEROL) 1.25 MG (50000 UNIT) PO CAPS: 50000.0000 [IU] | ORAL_CAPSULE | ORAL | 0 refills | Status: DC

## 2022-07-28 MED ORDER — QUETIAPINE FUMARATE ER 400 MG PO TB24: 400.0000 mg | ORAL_TABLET | Freq: Every day | ORAL | 0 refills | Status: DC

## 2022-07-28 MED ORDER — HYDROXYZINE HCL 25 MG PO TABS: 25.0000 mg | ORAL_TABLET | Freq: Three times a day (TID) | ORAL | 1 refills | Status: DC | PRN

## 2022-07-28 MED ORDER — GABAPENTIN 100 MG PO CAPS: 100.0000 mg | ORAL_CAPSULE | Freq: Three times a day (TID) | ORAL | 1 refills | Status: DC

## 2022-07-28 NOTE — Progress Notes (Signed)
Chase Hunter [id number] 10/27/1960 61 y.o.  Subjective:   Patient ID:  Chase Hunter. is a 61 y.o. (DOB [birth year]) male.  Chief Complaint:  Chief Complaint  Patient presents with   Follow-up    Anxiety and depression    HPI Chase Hunter. presents to the office today for follow-up of anxiety and mood disturbance.   "I'm doing well." He reports that gabapentin is "working well for me." He reports that he notices less rumination and that anxious thoughts do not seem to attach to certain worries. He reports that his therapist noticed a change in his demeanor and that he seemed to appear slightly depressed. He reports that his wife has commented that his mood has improved. "I feel very active and useful around the house." He reports that he has less impulsivity and improved executive function. Denies any recent panic attacks. He reports that he played in a concert a few nights ago and briefly greeted people and was ok with this. He reports that he has been avoiding situations that require prolonged social interaction. He reports that his previous mood was "heightened... so anxious." He reports that energy and motivation have been ok and is completing tasks around the house and related to helping grandchild. Denies irritability. He reports that he is sleeping "fine." He is sleeping 8-9 hours a night. He reports that his appetite has increased and he is now feeling hungry and able to enjoy food. Concentration has been "alright." Denies SI.   Taking Gabapentin around 6 am, 12 noon, and 6 pm.    Past Psychiatric Medication Trials: Lamictal- Rash. Had seizure when it was stopped.  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 Abilify- cannot recall response. Minimal improvement. Seroquel XR-Affective dulling, excessive somnolence. Took up to 600 mg QHS Seroquel Saphris- SI, uncontrolled  crying Latuda Trazodone- Increased BP and suicidal thoughts Propranolol- Raised BP Clonidine- increased panic, crying, and SI Sertraline- Worsening mood symptoms and anxiety Lexapro Wellbutrin Duloxetine-excessively talkative. "Saying things out loud that I thought I was just thinking."  Buspar-Increased anxiety and increased BP.  Klonopin Diazepam- Paradoxical effect Hydroxyzine    AIMS    Flowsheet Row Office Visit from 07/28/2022 in [place] Office Visit from 07/16/2022 in [place] Office Visit from 03/21/2022 in [place] Office Visit from 10/09/2021 in [place] Visit from 08/12/2021 in [place] Total Score 0 0 0 0 0      PHQ2-9    [place] Visit from 08/16/2021 in [place] at [facility] from 06/14/2021 in [place] at [facility] from 06/13/2020 in [place] at [facility] Total Score 5 2 0  PHQ-9 Total Score 20 12 --      Flowsheet Row ED from 08/14/2021 in [place] from 06/14/2021 in [place] at [place] No Risk Error: Q3, 4, or 5 should not be populated when Q2 is No        Review of Systems:  Review of Systems  Cardiovascular:  Negative for palpitations.       He reports that monitor showed infrequent PVC's and PAC's.   Musculoskeletal:  Negative for gait problem.  Neurological:  Negative for tremors.  Psychiatric/Behavioral:         Please refer to HPI    Medications: I have  reviewed the patient's current medications.  Current Outpatient Medications  Medication Sig Dispense Refill   amLODipine (NORVASC) 5 MG tablet Take 1 tablet (5 mg total) by mouth daily. 30 tablet 0   lisinopril (ZESTRIL) 40 MG tablet Take 1 tablet (40 mg total) by mouth daily. 30 tablet 0   omega-3 acid ethyl  esters (LOVAZA) 1 g capsule Take 1 capsule (1 g total) by mouth daily. 30 capsule 0   testosterone cypionate (DEPOTESTOSTERONE CYPIONATE) 200 MG/ML injection Inject 0.5 mLs (100 mg total) into the muscle once a week. 2.5 mL 3   vitamin D3 (CHOLECALCIFEROL) 25 MCG tablet Take 1 tablet (1,000 Units total) by mouth daily. 30 tablet 0   albuterol (VENTOLIN HFA) 108 (90 Base) MCG/ACT inhaler Inhale 2 puffs into the lungs every 4 (four) hours as needed for wheezing or shortness of breath. (Patient not taking: Reported on 07/28/2022) 1 each 0   fluticasone (FLONASE) 50 MCG/ACT nasal spray Place 2 sprays into both nostrils 2 (two) times daily as needed for allergies or rhinitis. (Patient not taking: Reported on 07/28/2022) 9.9 mL 0   gabapentin (NEURONTIN) 100 MG capsule Take 1 capsule (100 mg total) by mouth 3 (three) times daily. 90 capsule 1   hydrOXYzine (ATARAX) 25 MG tablet Take 1 tablet (25 mg total) by mouth 3 (three) times daily as needed for anxiety. 30 tablet 1   L-Methylfolate-Algae (DEPLIN 15) 15-90.314 MG CAPS Take 15 mg by mouth daily. 90 capsule 3   QUEtiapine (SEROQUEL XR) 400 MG 24 hr tablet Take 1 tablet (400 mg total) by mouth at bedtime. 30 tablet 0   QUEtiapine (SEROQUEL) 200 MG tablet Take 1 tablet (200 mg total) by mouth 2 (two) times daily. 60 tablet 0   Vitamin D, Ergocalciferol, (DRISDOL) 1.25 MG (50000 UNIT) CAPS capsule Take 1 capsule (50,000 Units total) by mouth every 7 (seven) days. 5 capsule 0   No current facility-administered medications for this visit.    Medication Side Effects: Other: Increased appetite  Allergies:  Allergies  Allergen Reactions   Cephalexin Hives   Depakote [Divalproex Sodium]     Caused Pancreatitis   Trazodone And Nefazodone     Suicidal thoughts   Lamictal [Lamotrigine] Rash    Had a seizure when he stopped it   Lipitor [Atorvastatin] Rash    Other reaction(s): Other (See Comments) Memory issues that have not resolved    Past Medical  History:  Diagnosis Date   Allergy    Anemia    Asthma    Bipolar II disorder ([place])    Depression    Gallstones    GERD (gastroesophageal reflux disease)    Hx of adenomatous colonic polyps    Hyperlipidemia    Hypertension    Pancreatitis    Seizures (HCC)    past hx 6 yrs ago x 1- coming off meds caused 1 seizure     Past Medical History, Surgical history, Social history, and Family history were reviewed and updated as appropriate.   Please see review of systems for further details on the patient's review from today.   Objective:   Physical Exam:  There were no vitals taken for this visit.  Physical Exam Constitutional:      General: He is not in acute distress. Musculoskeletal:        General: No deformity.  Neurological:     Mental Status: He is alert and oriented to person, place, and time.     Coordination: Coordination normal.  Psychiatric:        Attention and Perception: Attention and perception normal. He does not perceive auditory or visual hallucinations.        Mood and Affect: Affect is not labile, blunt, angry or inappropriate.        Speech: Speech normal.        Behavior: Behavior normal.        Thought Content: Thought content normal. Thought content is not paranoid or delusional. Thought content does not include homicidal or suicidal ideation. Thought content does not include homicidal or suicidal plan.        Cognition and Memory: Cognition and memory normal.        Judgment: Judgment normal.     Comments: Insight intact Mood presents as significantly less anxious and less depressed     Lab Review:     Component Value Date/Time   NA 139 07/08/2022 0646   K 4.1 07/08/2022 0646   CL 104 07/08/2022 0646   CO2 24 07/08/2022 0646   GLUCOSE 102 (H) 07/08/2022 0646   BUN 20 07/08/2022 0646   CREATININE 0.90 07/08/2022 0646   CREATININE 0.96 05/14/2020 0806   CALCIUM 9.4 07/08/2022 0646   PROT 7.6 07/08/2022 0646   ALBUMIN 4.6 07/08/2022 0646    AST 19 07/08/2022 0646   ALT 19 07/08/2022 0646   ALKPHOS 53 07/08/2022 0646   BILITOT 1.1 07/08/2022 0646   GFRNONAA >60 07/08/2022 0646   GFRNONAA 87 05/14/2020 0806   GFRAA 101 05/14/2020 0806       Component Value Date/Time   WBC 9.4 07/08/2022 0646   RBC 5.04 07/08/2022 0646   HGB 15.7 07/08/2022 0646   HCT 46.4 07/08/2022 0646   PLT 232 07/08/2022 0646   MCV 92.1 07/08/2022 0646   MCH 31.2 07/08/2022 0646   MCHC 33.8 07/08/2022 0646   RDW 11.9 07/08/2022 0646   LYMPHSABS 1.9 08/14/2021 1415   MONOABS 0.5 08/14/2021 1415   EOSABS 0.1 08/14/2021 1415   BASOSABS 0.1 08/14/2021 1415    No results found for: "POCLITH", "LITHIUM"   Lab Results  Component Value Date   VALPROATE 33.1 (L) 07/15/2019     .res Assessment: Plan:    Pt seen for 25 minutes and time spent discussing response to Gabapentin. He reports that his anxiety and mood have improved since starting Gabapentin and he has not had any recent palpitations. He reports that he would like to continue current medications without changes at this time.  Continue Gabapentin 100 mg po TID for anxiety.  Will continue Seroquel 200 mg in the morning and mid-day for mood and anxiety since he reports that this has been partially effective.  Will continue Seroquel XR 400 mg in the evening for mood and anxiety.  Will continue Hydroxyzine 25 mg po TID prn anxiety. Pt to follow-up with this provider in 3 weeks or sooner if clinically indicated.  Patient advised to contact office with any questions, adverse effects, or acute worsening in signs and symptoms.   Chase Hunter was seen today for follow-up.  Diagnoses and all orders for this visit:  Generalized anxiety disorder -     gabapentin (NEURONTIN) 100 MG capsule; Take 1 capsule (100 mg total) by mouth 3 (three) times daily. -     hydrOXYzine (ATARAX) 25 MG tablet; Take 1 tablet (25 mg total) by mouth 3 (three) times daily as needed for anxiety. -     QUEtiapine (SEROQUEL) 200  MG tablet; Take 1 tablet (200  mg total) by mouth 2 (two) times daily. -     QUEtiapine (SEROQUEL XR) 400 MG 24 hr tablet; Take 1 tablet (400 mg total) by mouth at bedtime.  Bipolar II disorder (HCC) -     QUEtiapine (SEROQUEL) 200 MG tablet; Take 1 tablet (200 mg total) by mouth 2 (two) times daily. -     QUEtiapine (SEROQUEL XR) 400 MG 24 hr tablet; Take 1 tablet (400 mg total) by mouth at bedtime.  Vitamin D deficiency -     Vitamin D, Ergocalciferol, (DRISDOL) 1.25 MG (50000 UNIT) CAPS capsule; Take 1 capsule (50,000 Units total) by mouth every 7 (seven) days.     Please see After Visit Summary for patient specific instructions.  Future Appointments  Date Time Provider Honor  08/07/2022  1:00 PM Shanon Ace, LCSW [facility] None  08/13/2022 11:30 AM [facility] 1 [facility]  08/18/2022 11:45 AM Thayer Headings, PMHNP [facility] None  09/02/2022 12:00 PM Shanon Ace, LCSW [facility] None  09/25/2022  2:00 PM Nahser, Wonda Cheng, MD [facility]    No orders of the defined types were placed in this encounter.   -------------------------------

## 2022-07-31 DIAGNOSIS — L03011 Cellulitis of right finger: Secondary | ICD-10-CM | POA: Diagnosis not present

## 2022-08-04 ENCOUNTER — Ambulatory Visit: Payer: Medicare PPO | Admitting: Psychiatry

## 2022-08-04 DIAGNOSIS — E291 Testicular hypofunction: Secondary | ICD-10-CM | POA: Diagnosis not present

## 2022-08-04 DIAGNOSIS — Z125 Encounter for screening for malignant neoplasm of prostate: Secondary | ICD-10-CM | POA: Diagnosis not present

## 2022-08-04 DIAGNOSIS — E785 Hyperlipidemia, unspecified: Secondary | ICD-10-CM | POA: Diagnosis not present

## 2022-08-07 ENCOUNTER — Ambulatory Visit: Payer: Medicare PPO | Admitting: Psychiatry

## 2022-08-07 DIAGNOSIS — F411 Generalized anxiety disorder: Secondary | ICD-10-CM

## 2022-08-07 DIAGNOSIS — D0439 Carcinoma in situ of skin of other parts of face: Secondary | ICD-10-CM | POA: Diagnosis not present

## 2022-08-07 NOTE — Progress Notes (Signed)
[facility] Counselor/Therapist Progress Note  Patient ID: Cashtyn Pouliot., MRN: [id number],    Date: 08/07/2022  Time Spent: 48 minutes   Treatment Type: Individual Therapy  Reported Symptoms: anxiety (improved some), depression ("low grade")  Mental Status Exam:  Appearance:   Casual     Behavior:  Appropriate, Sharing, and Motivated  Motor:  Normal  Speech/Language:   Clear and Coherent  Affect:  Depressed and anxious  Mood:  anxious and depressed  Thought process:  goal directed  Thought content:    Intrusive thoughts have decreased some  Sensory/Perceptual disturbances:    WNL  Orientation:  oriented to person, place, time/date, situation, day of week, month of year, year, and stated date of Dec. 7, 2023  Attention:  Good  Concentration:  Good  Memory:  Day to day forgetting sometimes and Dr is aware  Fund of knowledge:   Good  Insight:    Good  Judgment:   Good  Impulse Control:  Good   Risk Assessment: Danger to Self:  No Self-injurious Behavior: No Danger to Others: No Duty to Warn:no Physical Aggression / Violence:No  Access to Firearms a concern: No  Gang Involvement:No   Subjective: Patient today reporting anxiety and depression have "decreased some and hadn't typically felt depressed." Feels Gabapentin is helping his anxiety. Shares that the holidays are typically good now after mom's death several yrs ago, as she was quite irritable around the holidays.Self worth improving.  Overall mood improved. Communication with wife improved with wife. Irritability expressed over wife/daughter leaving patient for several hours watching the grandson. "I can provide care, but don't like to do it that long" and proceeded to vent his feelings about this in session today, emphatic although not angrily.  "I find things to worry about" and discussed this more today as well as how he tries at times not to worry." Did play music at local bar with some others recently and  did fine, not negatively affected by crowd of people. "Feeling more leveled out and not suspicious."  Feels he is getting better at putting bad experiences behind himself and not fixating on particular things or situations.   Interventions: Cognitive Behavioral Therapy, Solution-Oriented/Positive Psychology, and Ego-Supportive  Long-term goal: Reduce overall level, frequency, and intensity of the depression and anxiety so that daily functioning is not impaired. Short-term goal: Verbalize an understanding of the role that fearful thinking plays in creating fears, excessive worry, and persistent anxiety/depressive symptoms. Strategies: Identify, challenge, and replace fearful self-talk with positive, realistic, and empowering self-talk.    Diagnosis:   ICD-10-CM   1. Generalized anxiety disorder  F41.1      Plan: Patient today participating actively in session and feels he "has done well past couple weeks" and reflected some on that today which seemed helpful to patient. Sister-in-law coming to visit them soon, which patient indicates should be a "good thing".  Reflected more on his struggles earlier and how things have calm down some most recently.  Feels that he can move forward some at this point and adds that he is always "monitoring myself and interactions with the world".  Patient in his moving forward most recently after a difficult time and his continuing to make healthy decisions including healthy boundaries with other people as he feels comfortable. Encouraged patient in practicing more positive behaviors as discussed in session including: Staying in touch with people who are supportive, remaining in the present and focusing on what he can change  versus cannot work with strategies for decreasing anxious thoughts, trying not to look for "things to worry about", look for more positives versus negatives daily, stay on his prescribed medication, remain in contact with others that feel comfortable  to him, healthy nutrition and exercise, increase his positive self talk, be able to take timeouts as needed, and recognize the strength he shows when working with goal-directed behaviors to move forward in a direction that supports his improved emotional health.  Seem to need this time today to do some review over past few weeks and acknowledge progress leading him to the point where he is currently and feeling more stable.  Goal review and progress/challenges noted with patient.  Next appointment within 2 weeks.  This record has been created using [employer].  Chart creation errors have been sought, but may not always have been located and corrected.  Such creation errors do not reflect on the standard of medical care provided.   Shanon Ace, LCSW

## 2022-08-11 DIAGNOSIS — E785 Hyperlipidemia, unspecified: Secondary | ICD-10-CM | POA: Diagnosis not present

## 2022-08-11 DIAGNOSIS — Z125 Encounter for screening for malignant neoplasm of prostate: Secondary | ICD-10-CM | POA: Diagnosis not present

## 2022-08-11 DIAGNOSIS — E291 Testicular hypofunction: Secondary | ICD-10-CM | POA: Diagnosis not present

## 2022-08-13 ENCOUNTER — Ambulatory Visit (HOSPITAL_BASED_OUTPATIENT_CLINIC_OR_DEPARTMENT_OTHER)
Admission: RE | Admit: 2022-08-13 | Discharge: 2022-08-13 | Disposition: A | Discharge status: Home / Self Care | Payer: Medicare PPO | Source: Ambulatory Visit | Attending: Cardiovascular Disease | Admitting: Cardiovascular Disease

## 2022-08-13 DIAGNOSIS — E782 Mixed hyperlipidemia: Secondary | ICD-10-CM | POA: Insufficient documentation

## 2022-08-13 DIAGNOSIS — R000 Tachycardia, unspecified: Secondary | ICD-10-CM | POA: Insufficient documentation

## 2022-08-15 ENCOUNTER — Telehealth: Payer: Self-pay | Admitting: Cardiovascular Disease

## 2022-08-15 DIAGNOSIS — E782 Mixed hyperlipidemia: Secondary | ICD-10-CM

## 2022-08-15 NOTE — Telephone Encounter (Signed)
-----   Message from Thayer Headings, MD sent at 08/14/2022  9:09 AM EST ----- Coronary calcium score of 38.9. This was 60 st percentile for age and sex matched control  He does not tolerate statins Please refer to lipid clinic for consideration for PCSK9 inhibitor

## 2022-08-15 NOTE — Telephone Encounter (Signed)
Pt has viewed results and MD recommendations via MyChart. Referral to lipid clinic placed at this time.

## 2022-08-18 ENCOUNTER — Encounter: Payer: Self-pay | Admitting: Psychiatry

## 2022-08-18 ENCOUNTER — Ambulatory Visit: Payer: Medicare PPO | Admitting: Psychiatry

## 2022-08-18 DIAGNOSIS — F3181 Bipolar II disorder: Secondary | ICD-10-CM

## 2022-08-18 DIAGNOSIS — F411 Generalized anxiety disorder: Secondary | ICD-10-CM

## 2022-08-18 MED ORDER — QUETIAPINE FUMARATE 200 MG PO TABS: ORAL_TABLET | ORAL | 0 refills | Status: DC

## 2022-08-18 MED ORDER — QUETIAPINE FUMARATE ER 200 MG PO TB24: 200.0000 mg | ORAL_TABLET | Freq: Every day | ORAL | 1 refills | Status: DC

## 2022-08-18 MED ORDER — GABAPENTIN 100 MG PO CAPS: 200.0000 mg | ORAL_CAPSULE | Freq: Three times a day (TID) | ORAL | 1 refills | Status: DC

## 2022-08-18 MED ORDER — HYDROXYZINE HCL 25 MG PO TABS: 25.0000 mg | ORAL_TABLET | Freq: Three times a day (TID) | ORAL | 1 refills | Status: DC | PRN

## 2022-08-18 NOTE — Progress Notes (Signed)
Chase Hunter [id number] 12-Jul-1961 61 y.o.  Subjective:   Patient ID:  Chase Hunter. is a 61 y.o. (DOB 12-Jan-1961) male.  Chief Complaint:  Chief Complaint  Patient presents with   Medication Problem    Daytime somnolence    HPI Chase Hunter. presents to the office today for follow-up of Bipolar disorder and anxiety. He reports that he is "doing ok." He reports that he has been feeling tired during the daytime. He feels that Seroquel causes daytime somnolence and Gabapentin does not seem to cause sleepiness. Sleepiness after taking immediate release Seroquel. He reports that Gabapentin seems to be helpful for his anxiety and asks about increasing Gabapentin and decreasing Seroquel.   He reports that racing thoughts have improved with Gabapentin. He reports that Gabapentin may dampen his affect- "I feel flatter, and that's fine." He reports that he continues to have some anxiety. Last week walked into the grocery store and had to leave immediately due to anxiety. Depression has been controlled. He reports that his irritability has been ok. Denies manic s/s. 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 Sleeping 8-9 hours. Appetite has been increased with Gabapentin. Concentration has been ok. He reports that his energy and motivation have been lower- "I'm still making myself do everything I need to do." Denies SI.   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 He enjoys time with his grandson.   Sister-in-law will be coming to visit over the holidays.   Past Psychiatric Medication Trials: Lamictal- Rash. Had seizure when it was stopped.  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 Abilify- cannot recall response. Minimal improvement. Seroquel XR-Affective dulling, excessive somnolence. Took up to 600 mg  QHS Seroquel Saphris- SI, uncontrolled crying Latuda Trazodone- Increased BP and suicidal thoughts Propranolol- Raised BP Clonidine- increased panic, crying, and SI Sertraline- Worsening mood symptoms and anxiety Lexapro Wellbutrin Duloxetine-excessively talkative. "Saying things out loud that I thought I was just thinking."  Buspar-Increased anxiety and increased BP.  Klonopin Diazepam- Paradoxical effect Hydroxyzine  AIMS    Flowsheet Row Office Visit from 08/18/2022 in [facility] Office Visit from 07/28/2022 in [facility] Office Visit from 07/16/2022 in [facility] Office Visit from 03/21/2022 in [facility] Visit from 10/09/2021 in [facility] Total Score 0 0 0 0 0      PHQ2-9    [place] Office Visit from 08/16/2021 in [facility] at [facility] from 06/14/2021 in [facility] at [facility] from 06/13/2020 in [facility] at [facility] Total Score 5 2 0  PHQ-9 Total Score 20 12 --      Flowsheet Row ED from 08/14/2021 in [facility] from 06/14/2021 in [facility] at [facility] No Risk Error: Q3, 4, or 5 should not be populated when Q2 is No        Review of Systems:  Review of Systems  Cardiovascular:        He reports that he has not had palpitations or racing HR since starting Gabapentin. He reports that monitor showed that previous palpitations were benign.   Musculoskeletal:  Negative for gait problem.  Skin:        Had squamous cell carcinoma removed recently.  Psychiatric/Behavioral:         Please refer to HPI    Medications: I have reviewed the patient's current medications.  Current Outpatient Medications  Medication Sig Dispense Refill   amLODipine (NORVASC) 5 MG tablet Take 1 tablet (5 mg total) by mouth daily.  30 tablet 0   lisinopril (ZESTRIL) 40 MG tablet Take 1 tablet (40 mg total) by mouth daily. 30 tablet 0   omega-3 acid ethyl esters (LOVAZA) 1 g capsule Take 1 capsule (1 g total) by mouth daily. 30 capsule 0   testosterone cypionate (DEPOTESTOSTERONE CYPIONATE) 200 MG/ML injection Inject 0.5 mLs (100 mg total) into the muscle once a week. 2.5 mL 3   Vitamin D, Ergocalciferol, (DRISDOL) 1.25 MG (50000 UNIT) CAPS capsule Take 1 capsule (50,000 Units total) by mouth every 7 (seven) days. 5 capsule 0   vitamin D3 (CHOLECALCIFEROL) 25 MCG tablet Take 1 tablet (1,000 Units total) by mouth daily. 30 tablet 0   albuterol (VENTOLIN HFA) 108 (90 Base) MCG/ACT inhaler Inhale 2 puffs into the lungs every 4 (four) hours as needed for wheezing or shortness of breath. (Patient not taking: Reported on 07/28/2022) 1 each 0   fluticasone (FLONASE) 50 MCG/ACT nasal spray Place 2 sprays into both nostrils 2 (two) times daily as needed for allergies or rhinitis. (Patient not taking: Reported on 07/28/2022) 9.9 mL 0   gabapentin (NEURONTIN) 100 MG capsule Take 2 capsules (200 mg total) by mouth 3 (three) times daily. 180 capsule 1   hydrOXYzine (ATARAX) 25 MG tablet Take 1 tablet (25 mg total) by mouth 3 (three) times daily as needed for anxiety. 30 tablet 1   L-Methylfolate-Algae (DEPLIN 15) 15-90.314 MG CAPS Take 15 mg by mouth daily. 90 capsule 3   QUEtiapine (SEROQUEL XR) 200 MG 24 hr tablet Take 1 tablet (200 mg total) by mouth at bedtime. Take with XR 400 mg to equal total dose of 600 mg 30 tablet 1   QUEtiapine (SEROQUEL) 200 MG tablet Take 1/2 tab twice daily for 5 days, then stop 60 tablet 0   No current facility-administered medications for this visit.    Medication Side Effects: Other: Drowsiness and increase in appetite  Allergies:  Allergies  Allergen Reactions   Cephalexin Hives   Depakote [Divalproex Sodium]     Caused Pancreatitis   Trazodone And Nefazodone     Suicidal thoughts   Lamictal  [Lamotrigine] Rash    Had a seizure when he stopped it   Lipitor [Atorvastatin] Rash    Other reaction(s): Other (See Comments) Memory issues that have not resolved    Past Medical History:  Diagnosis Date   Allergy    Anemia    Asthma    Bipolar II disorder ([place])    Depression    Gallstones    GERD (gastroesophageal reflux disease)    Hx of adenomatous colonic polyps    Hyperlipidemia    Hypertension    Pancreatitis    Seizures (HCC)    past hx 6 yrs ago x 1- coming off meds caused 1 seizure     Past Medical History, Surgical history, Social history, and Family history were reviewed and updated as appropriate.   Please see review of systems for further details on the patient's review from today.   Objective:   Physical Exam:  There were no vitals taken for this visit.  Physical Exam Constitutional:      General: He is not in acute distress. Musculoskeletal:  General: No deformity.  Neurological:     Mental Status: He is alert and oriented to person, place, and time.     Coordination: Coordination normal.  Psychiatric:        Attention and Perception: Attention and perception normal. He does not perceive auditory or visual hallucinations.        Mood and Affect: Mood is not depressed. Affect is not labile, blunt, angry or inappropriate.        Speech: Speech normal.        Behavior: Behavior normal.        Thought Content: Thought content normal. Thought content is not paranoid or delusional. Thought content does not include homicidal or suicidal ideation. Thought content does not include homicidal or suicidal plan.        Cognition and Memory: Cognition and memory normal.        Judgment: Judgment normal.     Comments: Insight intact Mood is mildly anxious     Lab Review:     Component Value Date/Time   NA 139 07/08/2022 0646   K 4.1 07/08/2022 0646   CL 104 07/08/2022 0646   CO2 24 07/08/2022 0646   GLUCOSE 102 (H) 07/08/2022 0646   BUN 20  07/08/2022 0646   CREATININE 0.90 07/08/2022 0646   CREATININE 0.96 05/14/2020 0806   CALCIUM 9.4 07/08/2022 0646   PROT 7.6 07/08/2022 0646   ALBUMIN 4.6 07/08/2022 0646   AST 19 07/08/2022 0646   ALT 19 07/08/2022 0646   ALKPHOS 53 07/08/2022 0646   BILITOT 1.1 07/08/2022 0646   GFRNONAA >60 07/08/2022 0646   GFRNONAA 87 05/14/2020 0806   GFRAA 101 05/14/2020 0806       Component Value Date/Time   WBC 9.4 07/08/2022 0646   RBC 5.04 07/08/2022 0646   HGB 15.7 07/08/2022 0646   HCT 46.4 07/08/2022 0646   PLT 232 07/08/2022 0646   MCV 92.1 07/08/2022 0646   MCH 31.2 07/08/2022 0646   MCHC 33.8 07/08/2022 0646   RDW 11.9 07/08/2022 0646   LYMPHSABS 1.9 08/14/2021 1415   MONOABS 0.5 08/14/2021 1415   EOSABS 0.1 08/14/2021 1415   BASOSABS 0.1 08/14/2021 1415    No results found for: "POCLITH", "LITHIUM"   Lab Results  Component Value Date   VALPROATE 33.1 (L) 07/15/2019     .res Assessment: Plan:    Pt seen for 30 minutes and time spent discussing his concerns about excessive daytime somnolence with Seroquel IR during the day and asking about taking more Gabapentin in lieu of Seroquel for anxiety symptoms. Recommended a gradual change in medication since overall his mood and anxiety symptoms are better controlled. Recommended increasing Seroquel XR to help reduce Seroquel IR during the daytime with goal to only slightly reduce total daily dose of Seroquel XR. Will change Seroquel XR to 600 mg in the evening.  Will reduce Seroquel IR to 200 mg 1/2 tab BID for 5 days, then stop. Discussed that he could re-start low dose of Seroquel IR during the daytime if he experiences worsening s/s or use low dose off-label as needed for severe acute symptoms.  Will increase Gabapentin to 200 mg three times daily for anxiety.  Continue hydroxyzine 25 mg three times daily as needed for anxiety. Continue Deplin 15 mg daily for MTHFR mutation and depression.  Pt to follow-up in 2-3 weeks or  sooner if clinically indicated. Recommend continuing therapy with Rinaldo Cloud, LCSW.  Patient advised to contact office with any  questions, adverse effects, or acute worsening in signs and symptoms.    Tayten was seen today for medication problem.  Diagnoses and all orders for this visit:  Generalized anxiety disorder -     QUEtiapine (SEROQUEL XR) 200 MG 24 hr tablet; Take 1 tablet (200 mg total) by mouth at bedtime. Take with XR 400 mg to equal total dose of 600 mg -     QUEtiapine (SEROQUEL) 200 MG tablet; Take 1/2 tab twice daily for 5 days, then stop -     gabapentin (NEURONTIN) 100 MG capsule; Take 2 capsules (200 mg total) by mouth 3 (three) times daily. -     hydrOXYzine (ATARAX) 25 MG tablet; Take 1 tablet (25 mg total) by mouth 3 (three) times daily as needed for anxiety.  Bipolar II disorder (HCC) -     QUEtiapine (SEROQUEL XR) 200 MG 24 hr tablet; Take 1 tablet (200 mg total) by mouth at bedtime. Take with XR 400 mg to equal total dose of 600 mg -     QUEtiapine (SEROQUEL) 200 MG tablet; Take 1/2 tab twice daily for 5 days, then stop     Please see After Visit Summary for patient specific instructions.  Future Appointments  Date Time Provider [place]  09/02/2022 12:00 PM Shanon Ace, LCSW [facility] None  09/05/2022 11:30 AM Thayer Headings, PMHNP [facility] None  09/23/2022  8:00 AM Shanon Ace, LCSW [facility] None  09/25/2022  2:00 PM Nahser, Wonda Cheng, MD [facility]    No orders of the defined types were placed in this encounter.   -------------------------------

## 2022-08-26 ENCOUNTER — Ambulatory Visit: Payer: Medicare PPO | Attending: Cardiovascular Disease | Admitting: Pharmacist

## 2022-08-26 ENCOUNTER — Telehealth: Payer: Self-pay | Admitting: Pharmacist

## 2022-08-26 ENCOUNTER — Encounter: Payer: Self-pay | Admitting: Pharmacist

## 2022-08-26 DIAGNOSIS — T466X5A Adverse effect of antihyperlipidemic and antiarteriosclerotic drugs, initial encounter: Secondary | ICD-10-CM

## 2022-08-26 DIAGNOSIS — E782 Mixed hyperlipidemia: Secondary | ICD-10-CM

## 2022-08-26 DIAGNOSIS — I2510 Atherosclerotic heart disease of native coronary artery without angina pectoris: Secondary | ICD-10-CM

## 2022-08-26 DIAGNOSIS — R931 Abnormal findings on diagnostic imaging of heart and coronary circulation: Secondary | ICD-10-CM | POA: Diagnosis not present

## 2022-08-26 NOTE — Patient Instructions (Signed)
It was nice meeting you today  We would like your LDL (bad cholesterol) to be less than 70  We would like to start a new medication called Repatha which you would inject once every 2 weeks  I will complete the prior authorization for you and contact you when it is approved  Once you start the medication we will recheck your fasting cholesterol in 2-3 months  Please call or message with any questions  Karren Cobble, PharmD, [place], [place], [place] [place], [place], [place], [address] Phone: [phone], Fax: [phone]

## 2022-08-26 NOTE — Telephone Encounter (Signed)
PA for Repatha submitted.  Key: Chase Hunter

## 2022-08-26 NOTE — Progress Notes (Unsigned)
Patient ID: Chase Hunter.                 DOB: [birth year]                    MRN: [id number]     HPI: Chase Hunter. is a 61 y.o. male patient referred to lipid clinic by Dr Acie Fredrickson. PMH is significant for hyperlipidemia, statin intolerance, HTN, palpitations, and bipolar disorder.  Patient presents today in good spirits. Reports mental health issues and reports while on statins his mental health deteriorated. Suffered memory loss which has not completely recovered. Does not think he has tried ezetimibe.  Knows that his father had elevated cholesterol but does not know the details. Does not drink alcohol but had incidence of Depakote induced pancreatitis in 2021.  Reports his diet is high in fruits and vegetables.  Current Medications: Lovaza  Intolerances:  Atorvastatin  Risk Factors:  HLD Elevated CAC  LDL goal: <70  Labs: TC 292, Trigs 109, HDL 39, LDL 231 (07/08/22)  Imaging: Coronary calcium score of 38.9. This was 51 st percentile for age and sex matched control.  Past Medical History:  Diagnosis Date   Allergy    Anemia    Asthma    Bipolar II disorder ([place])    Depression    Gallstones    GERD (gastroesophageal reflux disease)    Hx of adenomatous colonic polyps    Hyperlipidemia    Hypertension    Pancreatitis    Seizures ([place])    past hx 6 yrs ago x 1- coming off meds caused 1 seizure     Current Outpatient Medications on File Prior to Visit  Medication Sig Dispense Refill   albuterol (VENTOLIN HFA) 108 (90 Base) MCG/ACT inhaler Inhale 2 puffs into the lungs every 4 (four) hours as needed for wheezing or shortness of breath. (Patient not taking: Reported on 07/28/2022) 1 each 0   amLODipine (NORVASC) 5 MG tablet Take 1 tablet (5 mg total) by mouth daily. 30 tablet 0   fluticasone (FLONASE) 50 MCG/ACT nasal spray Place 2 sprays into both nostrils 2 (two) times daily as needed for allergies or rhinitis. (Patient not taking: Reported on  07/28/2022) 9.9 mL 0   gabapentin (NEURONTIN) 100 MG capsule Take 2 capsules (200 mg total) by mouth 3 (three) times daily. 180 capsule 1   hydrOXYzine (ATARAX) 25 MG tablet Take 1 tablet (25 mg total) by mouth 3 (three) times daily as needed for anxiety. 30 tablet 1   L-Methylfolate-Algae (DEPLIN 15) 15-90.314 MG CAPS Take 15 mg by mouth daily. 90 capsule 3   lisinopril (ZESTRIL) 40 MG tablet Take 1 tablet (40 mg total) by mouth daily. 30 tablet 0   omega-3 acid ethyl esters (LOVAZA) 1 g capsule Take 1 capsule (1 g total) by mouth daily. 30 capsule 0   QUEtiapine (SEROQUEL XR) 200 MG 24 hr tablet Take 1 tablet (200 mg total) by mouth at bedtime. Take with XR 400 mg to equal total dose of 600 mg 30 tablet 1   QUEtiapine (SEROQUEL) 200 MG tablet Take 1/2 tab twice daily for 5 days, then stop 60 tablet 0   testosterone cypionate (DEPOTESTOSTERONE CYPIONATE) 200 MG/ML injection Inject 0.5 mLs (100 mg total) into the muscle once a week. 2.5 mL 3   Vitamin D, Ergocalciferol, (DRISDOL) 1.25 MG (50000 UNIT) CAPS capsule Take 1 capsule (50,000 Units total) by mouth every 7 (seven) days. 5 capsule 0  vitamin D3 (CHOLECALCIFEROL) 25 MCG tablet Take 1 tablet (1,000 Units total) by mouth daily. 30 tablet 0   No current facility-administered medications on file prior to visit.    Allergies  Allergen Reactions   Cephalexin Hives   Depakote [Divalproex Sodium]     Caused Pancreatitis   Trazodone And Nefazodone     Suicidal thoughts   Lamictal [Lamotrigine] Rash    Had a seizure when he stopped it   Lipitor [Atorvastatin] Rash    Other reaction(s): Other (See Comments) Memory issues that have not resolved    Assessment/Plan:  1. Hyperlipidemia - Patient LDL 231 which is well above goal of <70. Patient not a great historian. Does not want to retrial statins but knows he needs aggressive LDL lowering.  Recommend starting PCSK9i.  Using demo pen, educated patient on mechanism of action, storage, site  selection, administration and possible adverse effects. Patient voiced understanding. Will complete prior authorization and contact patient when approved. Update lipid panel in 2-3 months.  Start Repatha '140mg'$  q 2 weeks Recheck lipid panel in 2-3 months Follow up as needed  Karren Cobble, PharmD, [place], [place], [place], [place], [place], [address] Phone: [phone], Fax: [phone]

## 2022-08-27 MED ORDER — REPATHA SURECLICK 140 MG/ML ~~LOC~~ SOAJ: 1.0000 mL | SUBCUTANEOUS | 3 refills | Status: DC

## 2022-08-27 NOTE — Telephone Encounter (Signed)
PA for Repatha approved through 09/01/23

## 2022-08-27 NOTE — Addendum Note (Signed)
Addended by: Rollen Sox on: 08/27/2022 10:15 AM   Modules accepted: Orders

## 2022-09-02 ENCOUNTER — Ambulatory Visit (INDEPENDENT_AMBULATORY_CARE_PROVIDER_SITE_OTHER): Payer: Medicare PPO | Admitting: Psychiatry

## 2022-09-02 DIAGNOSIS — R7301 Impaired fasting glucose: Secondary | ICD-10-CM | POA: Diagnosis not present

## 2022-09-02 DIAGNOSIS — F411 Generalized anxiety disorder: Secondary | ICD-10-CM

## 2022-09-02 DIAGNOSIS — E119 Type 2 diabetes mellitus without complications: Secondary | ICD-10-CM | POA: Diagnosis not present

## 2022-09-02 DIAGNOSIS — Z1159 Encounter for screening for other viral diseases: Secondary | ICD-10-CM | POA: Diagnosis not present

## 2022-09-02 DIAGNOSIS — Z0000 Encounter for general adult medical examination without abnormal findings: Secondary | ICD-10-CM | POA: Diagnosis not present

## 2022-09-02 DIAGNOSIS — D649 Anemia, unspecified: Secondary | ICD-10-CM | POA: Diagnosis not present

## 2022-09-02 NOTE — Progress Notes (Signed)
[facility] Counselor/Therapist Progress Note  Patient ID: Tahir Blank., MRN: [id number],    Date: 09/02/2022  Time Spent: 50 minutes  Treatment Type: Individual Therapy  Virtual Visit via Telehealth Note: MychartVideo session Connected with patient by a telemedicine/telehealth application, with their informed consent, and verified patient privacy and that I am speaking with the correct person using two identifiers. I discussed the limitations, risks, security and privacy concerns of performing psychotherapy and the availability of in person appointments. I also discussed with the patient that there may be a patient responsible charge related to this service. The patient expressed understanding and agreed to proceed. I discussed the treatment planning with the patient. The patient was provided an opportunity to ask questions and all were answered. The patient agreed with the plan and demonstrated an understanding of the instructions. The patient was advised to call  our office if  symptoms worsen or feel they are in a crisis state and need immediate contact.   Therapist Location: office Patient Location: home   Reported Symptoms: anxiety, depression  Mental Status Exam:  Appearance:   Casual     Behavior:  Appropriate, Sharing, and Motivated  Motor:  Normal  Speech/Language:   Clear and Coherent  Affect:  anxious  Mood:  anxious and depressed  Thought process:  goal directed  Thought content:    WNL  Sensory/Perceptual disturbances:    WNL  Orientation:  oriented to person, place, time/date, situation, day of week, month of year, year, and stated date of Jan. 2, 2023.   Attention:  Good  Concentration:  Good  Memory:  "Some occasional short term memory concerns"  Fund of knowledge:   Good  Insight:    Good  Judgment:   Fair and "not sure but do check things out with other people"  Impulse Control:  Good   Risk Assessment: Danger to Self:  No Self-injurious  Behavior: No Danger to Others: No Duty to Warn:no Physical Aggression / Violence:No  Access to Firearms a concern: No  Gang Involvement:No   Subjective:  Patient today needing to do video session due to recent health issue. States he is feeling flat but ok today, not a lot of "sparkling enjoyment" but also "not a lot of deep dives of depression either." Feels holidays went fine and able to enjoy the family. Does feel his meds are helping with depression but not having much "of happy times either." Did not have any direct contact with brother and sister "which was a good thing." Reporting improvement in overall mood and his self-worth. 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 Irritability improved. Reporting less of his "finding things to worry about" and feels that his current meds are helping that. 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 "Gives me a good outlet and something to plan for, that I enjoy." Continues to "feel less suspicion and more leveled out."Continues working to not fixate on things, not dwelling on the past nor worry about future, as he tried to put negative experiences behind himself and want to be more cheerful at time but due to my meds currently I can't do that and I need the positive effects I'm feeling from my meds as  they are now.  Interventions: Cognitive Behavioral Therapy and Ego-Supportive  Long-term goal: Reduce overall level, frequency, and intensity of the depression and anxiety so that daily functioning is not impaired. Short-term goal: Verbalize an understanding of the role that fearful thinking plays in creating fears, excessive worry, and persistent anxiety/depressive symptoms. Strategies: Identify, challenge, and replace fearful self-talk  with positive, realistic, and empowering self-talk.   Diagnosis:   ICD-10-CM   1. Generalized anxiety disorder  F41.1      Plan: Patient today feeling more encouraged overall, but feeling like "I got used up in my childhood taking care of my brother and sister,  and in my teen yrs having to earn money to help family, and they don't show appropriate attention to patient. Discussed this hurt more today and able to eventually move forward in session. Lingering issues with siblings that are difficult although patient feels he's move beyond it. Interesting that patient did before end of session mention in talking about brother and sister and how they may actually have some positive feelings towards patient and he may not be understanding it that way, so trying to be more open to some positives. Knows "I need to continue to avoid getting upset and worked up over little things, something to get worried or angry or afraid about." Staying on meds with benefit, and "I need to reflect on past at times and not making mistake of over-reacting to things and making assumptions that may or may not be accurate.". Knows to "not get stuck in negative parts of the past."  Progressing and needs continued therapy as he aims for longer spans of time where he is feeling better and more stabilized. Encouraged patient in his practice of more positive behaviors as noted in session including: Remaining in touch with people who are supportive, staying in the present and focusing on what he can change versus cannot, use strategies for decreasing anxious thoughts, trying not to look for "things to worry about", looking for more positives versus negatives daily, stay on his prescribed medication, remain in contact with others that feel comfortable to him, healthy nutrition and exercise, increase his positive self talk, be able to take timeouts as needed, and realize the strength he shows working with goal-directed behaviors to move in a  direction that supports his improved emotional health.  Goal review and progress/challenges noted with patient.  Next appointment within 3 weeks.  This record has been created using [employer].  Chart creation errors have been sought, but may not always have been located and corrected.  Such creation errors do not reflect on the standard of medical care provided.   Shanon Ace, LCSW

## 2022-09-02 NOTE — Addendum Note (Signed)
Addended byShanon Ace on: 09/02/2022 12:48 PM   Modules accepted: Level of Service

## 2022-09-04 ENCOUNTER — Other Ambulatory Visit: Payer: Self-pay | Admitting: Psychiatry

## 2022-09-04 DIAGNOSIS — E559 Vitamin D deficiency, unspecified: Secondary | ICD-10-CM

## 2022-09-05 ENCOUNTER — Telehealth (INDEPENDENT_AMBULATORY_CARE_PROVIDER_SITE_OTHER): Payer: Medicare PPO | Admitting: Psychiatry

## 2022-09-05 ENCOUNTER — Encounter: Payer: Self-pay | Admitting: Psychiatry

## 2022-09-05 DIAGNOSIS — F411 Generalized anxiety disorder: Secondary | ICD-10-CM | POA: Diagnosis not present

## 2022-09-05 DIAGNOSIS — F3181 Bipolar II disorder: Secondary | ICD-10-CM | POA: Diagnosis not present

## 2022-09-05 DIAGNOSIS — F410 Panic disorder [episodic paroxysmal anxiety] without agoraphobia: Secondary | ICD-10-CM | POA: Diagnosis not present

## 2022-09-05 NOTE — Progress Notes (Signed)
Chase Hunter Chase Hunter [id number] 26-Sep-1960 62 y.o.  Virtual Visit via Video Note  I connected with pt @ on 09/05/22 at 11:30 AM EST by a video enabled telemedicine application and verified that I am speaking with the correct person using two identifiers.   I discussed the limitations of evaluation and management by telemedicine and the availability of in person appointments. The patient expressed understanding and agreed to proceed.  I discussed the assessment and treatment plan with the patient. The patient was provided an opportunity to ask questions and all were answered. The patient agreed with the plan and demonstrated an understanding of the instructions.   The patient was advised to call back or seek an in-person evaluation if the symptoms worsen or if the condition fails to improve as anticipated.  I provided 13 minutes of non-face-to-face time during this encounter.  The patient was located at home.  The provider was located at [place].   Thayer Headings, PMHNP   Subjective:   Patient ID:  Chase Hunter. is a 62 y.o. (DOB [birth year]) male.  Chief Complaint: No chief complaint on file.   HPI Chase Hunter. presents for follow-up of anxiety and Bipolar disorder.  "I've been doing really well." He reports that increase in Gabapentin and decrease in Seroquel have been helpful. He reports that daytime somnolence has improved.   He reports his anxiety has been controlled. He reports that he has been "flat, but I will take that." He reports that he is able to enjoy things. He reports that now when he has intrusive thoughts he no longer has a "visceral response" and these are shorter in duration. Denies panic or excessive worry. Denies depressed mood. Denies manic s/s to include impulsivity or risky behavior. Energy and motivation have been good and he reports that he is able to do what he needs to do. He reports that he is sleeping 8-9 hours a night.  No change in appetite. He reports adequate concentration and reports that he notices some changes in his cognition. Denies SI.   "This is the most stable I have felt in a long time."   Rarely taking Hydroxyzine prn.   Past Psychiatric Medication Trials: Lamictal- Rash. Had seizure when it was stopped.  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 Abilify- cannot recall response. Minimal improvement. Seroquel XR-Affective dulling, excessive somnolence. Took up to 600 mg QHS Seroquel Saphris- SI, uncontrolled crying Latuda Trazodone- Increased BP and suicidal thoughts Propranolol- Raised BP Clonidine- increased panic, crying, and SI Sertraline- Worsening mood symptoms and anxiety Lexapro Wellbutrin Duloxetine-excessively talkative. "Saying things out loud that I thought I was just thinking."  Buspar-Increased anxiety and increased BP.  Klonopin Diazepam- Paradoxical effect Hydroxyzine  Review of Systems:  Review of Systems  Cardiovascular:  Negative for palpitations.  Musculoskeletal:  Negative for gait problem.  Skin:        Rash from topical cream  Neurological:  Negative for tremors.  Psychiatric/Behavioral:         Please refer to HPI    Medications: I have reviewed the patient's current medications.  Current Outpatient Medications  Medication Sig Dispense Refill   amLODipine (NORVASC) 5 MG tablet Take 1 tablet (5 mg total) by mouth daily. 30 tablet 0   Evolocumab (REPATHA SURECLICK) 856 MG/ML SOAJ Inject 140 mg into the skin every 14 (fourteen) days. 6 mL 3   gabapentin (NEURONTIN) 100 MG capsule Take  2 capsules (200 mg total) by mouth 3 (three) times daily. 180 capsule 1   hydrOXYzine (ATARAX) 25 MG tablet Take 1 tablet (25 mg total) by mouth 3 (three) times daily as needed for anxiety. 30 tablet 1   lisinopril (ZESTRIL) 40 MG tablet Take 1 tablet (40 mg total) by mouth daily. 30 tablet 0    omega-3 acid ethyl esters (LOVAZA) 1 g capsule Take 1 capsule (1 g total) by mouth daily. 30 capsule 0   testosterone cypionate (DEPOTESTOSTERONE CYPIONATE) 200 MG/ML injection Inject 0.5 mLs (100 mg total) into the muscle once a week. 2.5 mL 3   vitamin D3 (CHOLECALCIFEROL) 25 MCG tablet Take 1 tablet (1,000 Units total) by mouth daily. 30 tablet 0   L-Methylfolate-Algae (DEPLIN 15) 15-90.314 MG CAPS Take 15 mg by mouth daily. 90 capsule 3   QUEtiapine (SEROQUEL XR) 200 MG 24 hr tablet Take 1 tablet (200 mg total) by mouth at bedtime. Take with XR 400 mg to equal total dose of 600 mg (Patient taking differently: Take 600 mg by mouth at bedtime. Take with XR 400 mg to equal total dose of 600 mg) 30 tablet 1   Vitamin D, Ergocalciferol, 50000 units CAPS TAKE ONE CAPSULE BY MOUTH EVERY 7 DAYS 12 capsule 0   No current facility-administered medications for this visit.    Medication Side Effects: Other: Some affective dulling  Allergies:  Allergies  Allergen Reactions   Cephalexin Hives   Depakote [Divalproex Sodium]     Caused Pancreatitis   Trazodone And Nefazodone     Suicidal thoughts   Lamictal [Lamotrigine] Rash    Had a seizure when he stopped it   Lipitor [Atorvastatin] Rash    Other reaction(s): Other (See Comments) Memory issues that have not resolved    Past Medical History:  Diagnosis Date   Allergy    Anemia    Asthma    Bipolar II disorder ([place])    Depression    Gallstones    GERD (gastroesophageal reflux disease)    Hx of adenomatous colonic polyps    Hyperlipidemia    Hypertension    Pancreatitis    Seizures (HCC)    past hx 6 yrs ago x 1- coming off meds caused 1 seizure     Family History  Problem Relation Age of Onset   Atrial fibrillation Mother    Anxiety disorder Mother    Obesity Mother    Mood Disorder Mother    Anxiety disorder Father    Prostate cancer Father    Brain cancer Father    Anxiety disorder Brother    Anxiety disorder Daughter     Colon cancer Neg Hx    Colon polyps Neg Hx    Esophageal cancer Neg Hx    Rectal cancer Neg Hx    Stomach cancer Neg Hx     Social History   Socioeconomic History   Marital status: Married    Spouse name: Not on file   Number of children: 1   Years of education: Not on file   Highest education level: Not on file  Occupational History   Occupation: retired Pharmacist, hospital  Tobacco Use   Smoking status: Never   Smokeless tobacco: Never  Substance and Sexual Activity   Alcohol use: Never   Drug use: Not Currently   Sexual activity: Not on file  Other Topics Concern   Not on file  Social History Narrative   Not on file   Social Determinants of  Health   Financial Resource Strain: Low Risk  (06/14/2021)   Overall Financial Resource Strain (CARDIA)    Difficulty of Paying Living Expenses: Not hard at all  Food Insecurity: No Food Insecurity (07/07/2022)   Hunger Vital Sign    Worried About Running Out of Food in the Last Year: Never true    Ran Out of Food in the Last Year: Never true  Transportation Needs: No Transportation Needs (07/07/2022)   PRAPARE - Hydrologist (Medical): No    Lack of Transportation (Non-Medical): No  Physical Activity: Insufficiently Active (06/14/2021)   Exercise Vital Sign    Days of Exercise per Week: 3 days    Minutes of Exercise per Session: 40 min  Stress: Stress Concern Present (06/14/2021)   [place]    Feeling of Stress : Very much  Social Connections: Moderately Integrated (06/14/2021)   Social Connection and Isolation Panel [NHANES]    Frequency of Communication with Friends and Family: Three times a week    Frequency of Social Gatherings with Friends and Family: Three times a week    Attends Religious Services: Never    Active Member of Clubs or Organizations: Yes    Attends Archivist Meetings: More than 4 times per year    Marital  Status: Married  Human resources officer Violence: Not At Risk (07/07/2022)   Humiliation, Afraid, Rape, and Kick questionnaire    Fear of Current or Ex-Partner: No    Emotionally Abused: No    Physically Abused: No    Sexually Abused: No    Past Medical History, Surgical history, Social history, and Family history were reviewed and updated as appropriate.   Please see review of systems for further details on the patient's review from today.   Objective:   Physical Exam:  There were no vitals taken for this visit.  Physical Exam Neurological:     Mental Status: He is alert and oriented to person, place, and time.     Cranial Nerves: No dysarthria.  Psychiatric:        Attention and Perception: Attention and perception normal.        Mood and Affect: Mood normal.        Speech: Speech normal.        Behavior: Behavior is cooperative.        Thought Content: Thought content normal. Thought content is not paranoid or delusional. Thought content does not include homicidal or suicidal ideation. Thought content does not include homicidal or suicidal plan.        Cognition and Memory: Cognition and memory normal.        Judgment: Judgment normal.     Comments: Insight intact     Lab Review:     Component Value Date/Time   NA 139 07/08/2022 0646   K 4.1 07/08/2022 0646   CL 104 07/08/2022 0646   CO2 24 07/08/2022 0646   GLUCOSE 102 (H) 07/08/2022 0646   BUN 20 07/08/2022 0646   CREATININE 0.90 07/08/2022 0646   CREATININE 0.96 05/14/2020 0806   CALCIUM 9.4 07/08/2022 0646   PROT 7.6 07/08/2022 0646   ALBUMIN 4.6 07/08/2022 0646   AST 19 07/08/2022 0646   ALT 19 07/08/2022 0646   ALKPHOS 53 07/08/2022 0646   BILITOT 1.1 07/08/2022 0646   GFRNONAA >60 07/08/2022 0646   GFRNONAA 87 05/14/2020 0806   GFRAA 101 05/14/2020 0806  Component Value Date/Time   WBC 9.4 07/08/2022 0646   RBC 5.04 07/08/2022 0646   HGB 15.7 07/08/2022 0646   HCT 46.4 07/08/2022 0646   PLT 232  07/08/2022 0646   MCV 92.1 07/08/2022 0646   MCH 31.2 07/08/2022 0646   MCHC 33.8 07/08/2022 0646   RDW 11.9 07/08/2022 0646   LYMPHSABS 1.9 08/14/2021 1415   MONOABS 0.5 08/14/2021 1415   EOSABS 0.1 08/14/2021 1415   BASOSABS 0.1 08/14/2021 1415    No results found for: "POCLITH", "LITHIUM"   Lab Results  Component Value Date   VALPROATE 33.1 (L) 07/15/2019     .res Assessment: Plan:    Will continue current plan of care since target signs and symptoms are well controlled without any tolerability issues. Pt reports that he would like to continue current medications without changes at this time since he reports that mood and anxiety symptoms are stable at this time. He reports that he may wish to consider future dose reduction of Seroquel XR "but not at this time. Agree with continuing current medications without changes.  Continue Seroquel XR 600 mg in the evening for mood stabilization. Pt reports that he has an adequate supply of 400 mg and 200 mg tabs and does not need a script at this time.  Continue Gabapentin 200 mg po TID for anxiety.  Continue L-Methylfolate 15 mg po qd for mood and MTHFR mutation.  Recommend continuing therapy with Rinaldo Cloud, LCSW.  Pt to follow-up with this provider in 3 weeks or sooner if clinically indicated.  Patient advised to contact office with any questions, adverse effects, or acute worsening in signs and symptoms.  Diagnoses and all orders for this visit:  Bipolar II disorder ([place])  Generalized anxiety disorder  Panic     Please see After Visit Summary for patient specific instructions.  Future Appointments  Date Time Provider [place]  09/23/2022  8:00 AM Shanon Ace, LCSW [facility] None  09/25/2022  2:00 PM Nahser, Wonda Cheng, MD [facility]    No orders of the defined types were placed in this encounter.     -------------------------------

## 2022-09-05 NOTE — Telephone Encounter (Signed)
Ok to send

## 2022-09-22 DIAGNOSIS — H18591 Other hereditary corneal dystrophies, right eye: Secondary | ICD-10-CM | POA: Diagnosis not present

## 2022-09-22 DIAGNOSIS — H2513 Age-related nuclear cataract, bilateral: Secondary | ICD-10-CM | POA: Diagnosis not present

## 2022-09-22 DIAGNOSIS — H35033 Hypertensive retinopathy, bilateral: Secondary | ICD-10-CM | POA: Diagnosis not present

## 2022-09-23 ENCOUNTER — Ambulatory Visit: Payer: Medicare PPO | Admitting: Psychiatry

## 2022-09-24 ENCOUNTER — Encounter: Payer: Self-pay | Admitting: Cardiovascular Disease

## 2022-09-24 NOTE — Progress Notes (Unsigned)
Cardiology Office Note:    Date:  09/25/2022   ID:  Chase Reichert., DOB 08-09-1961, MRN [id number]  PCP:  Antony Contras, MD   [place] Providers Cardiologist:  new to [facility]    Referring MD: Antony Contras, MD   Chief Complaint  Patient presents with   Hyperlipidemia     History of Present Illness:    Chase Cueva. is a 62 y.o. male with a hx of bipolar disease, HLD, HTN and palpitatons  We were asked to see him for palpitations   Has noticed fast HR on occasion.   HR feels irregular   Episodes of tachycardia might last as long as 30 minutes.  Will slowly resolve back down to the normal heart rate.  These episodes occur without any exertion.  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  He becomes very confused. Family history of atrial fibrillation in his mother.  No history of premature coronary artery disease.   Does not snore to his knowledge   We discussed Kardia mobile    Jan. 25, 2024 Chase Hunter is seen for follow up of his palpitations and HLD  CAC score is 38.9  - 51st percentily for age / sex matched controls  Because his LDL is 60, we started Repatha after that coronary calcium report  I saw him in October with a complaint of tachypalpitations.  He sat up on event monitor. 14 day event monitor shows :  sinus rhythm, sinus tachycardia ,  rare PVCs Triggered events were primarily sinus tachycardia  He is managing his stress and no longer has these tachpalpitations    Recent cholesterol levels we have revealed the following: Triglyceride level is 99 Total cholesterol is 228 HDL is 43 LDL is 167  Is exercising ,  no CP , no dyspnea with exercise       Past Medical History:  Diagnosis Date   Allergy    Anemia     Asthma    Bipolar II disorder (HCC)    Depression    Gallstones    GERD (gastroesophageal reflux disease)    Hx of adenomatous colonic polyps    Hyperlipidemia    Hypertension    Pancreatitis    Seizures ([place])    past hx 6 yrs ago x 1- coming off meds caused 1 seizure     Past Surgical History:  Procedure Laterality Date   COLONOSCOPY     ESOPHAGOGASTRODUODENOSCOPY N/A 07/19/2020   Procedure: ESOPHAGOGASTRODUODENOSCOPY (EGD);  Surgeon: Milus Banister, MD;  Location: [facility];  Service: Endoscopy;  Laterality: N/A;   EUS N/A 07/19/2020   Procedure: UPPER ENDOSCOPIC ULTRASOUND (EUS) RADIAL;  Surgeon: Milus Banister, MD;  Location: [facility];  Service: Endoscopy;  Laterality: N/A;   POLYPECTOMY      Current Medications: Current Meds  Medication Sig   albuterol (VENTOLIN HFA) 108 (90 Base) MCG/ACT inhaler Inhale 2 puffs into the lungs every 6 (six) hours as needed.   amLODipine (NORVASC) 5 MG tablet Take 1 tablet (5 mg total) by mouth daily.   Evolocumab (REPATHA SURECLICK) 654 MG/ML SOAJ Inject 140 mg into the skin every 14 (fourteen) days.   fluticasone (FLONASE) 50 MCG/ACT  nasal spray Place 1 spray into both nostrils daily as needed.   gabapentin (NEURONTIN) 100 MG capsule Take 2 capsules (200 mg total) by mouth 3 (three) times daily.   hydrOXYzine (ATARAX) 25 MG tablet Take 1 tablet (25 mg total) by mouth 3 (three) times daily as needed for anxiety.   L-Methylfolate-Algae (DEPLIN 15) 15-90.314 MG CAPS Take 15 mg by mouth daily.   lisinopril (ZESTRIL) 40 MG tablet Take 1 tablet (40 mg total) by mouth daily.   omega-3 acid ethyl esters (LOVAZA) 1 g capsule Take 1 capsule (1 g total) by mouth daily.   QUEtiapine (SEROQUEL XR) 200 MG 24 hr tablet Take 1 tablet (200 mg total) by mouth at bedtime. Take with XR 400 mg to equal total dose of 600 mg   QUEtiapine (SEROQUEL XR) 400 MG 24 hr tablet Take 400 mg by mouth at bedtime.   testosterone cypionate (DEPOTESTOSTERONE  CYPIONATE) 200 MG/ML injection Inject 0.5 mLs (100 mg total) into the muscle once a week. (Patient taking differently: Inject 200 mg into the muscle once a week.)   Vitamin D, Ergocalciferol, 50000 units CAPS TAKE ONE CAPSULE BY MOUTH EVERY 7 DAYS   vitamin D3 (CHOLECALCIFEROL) 25 MCG tablet Take 1 tablet (1,000 Units total) by mouth daily.     Allergies:   Cephalexin, Depakote [divalproex sodium], Trazodone and nefazodone, Lamictal [lamotrigine], and Lipitor [atorvastatin]   Social History   Socioeconomic History   Marital status: Married    Spouse name: Not on file   Number of children: 1   Years of education: Not on file   Highest education level: Not on file  Occupational History   Occupation: retired Pharmacist, hospital  Tobacco Use   Smoking status: Never   Smokeless tobacco: Never  Substance and Sexual Activity   Alcohol use: Never   Drug use: Not Currently   Sexual activity: Not on file  Other Topics Concern   Not on file  Social History Narrative   Not on file   Social Determinants of Health   Financial Resource Strain: Low Risk  (06/14/2021)   Overall Financial Resource Strain (CARDIA)    Difficulty of Paying Living Expenses: Not hard at all  Food Insecurity: No Food Insecurity (07/07/2022)   Hunger Vital Sign    Worried About Running Out of Food in the Last Year: Never true    [place] in the Last Year: Never true  Transportation Needs: No Transportation Needs (07/07/2022)   PRAPARE - Hydrologist (Medical): No    Lack of Transportation (Non-Medical): No  Physical Activity: Insufficiently Active (06/14/2021)   Exercise Vital Sign    Days of Exercise per Week: 3 days    Minutes of Exercise per Session: 40 min  Stress: Stress Concern Present (06/14/2021)   [place]    Feeling of Stress : Very much  Social Connections: Moderately Integrated (06/14/2021)   Social  Connection and Isolation Panel [NHANES]    Frequency of Communication with Friends and Family: Three times a week    Frequency of Social Gatherings with Friends and Family: Three times a week    Attends Religious Services: Never    Active Member of Clubs or Organizations: Yes    Attends Music therapist: More than 4 times per year    Marital Status: Married     Family History: The patient's family history includes Anxiety disorder in his brother, daughter, father,  and mother; Atrial fibrillation in his mother; Brain cancer in his father; Mood Disorder in his mother; Obesity in his mother; Prostate cancer in his father. There is no history of Colon cancer, Colon polyps, Esophageal cancer, Rectal cancer, or Stomach cancer.  ROS:   Please see the history of present illness.     All other systems reviewed and are negative.  EKGs/Labs/Other Studies Reviewed:    The following studies were reviewed today:   EKG:   Recent Labs: 07/08/2022: ALT 19; BUN 20; Creatinine, Ser 0.90; Hemoglobin 15.7; Platelets 232; Potassium 4.1; Sodium 139; TSH 1.493  Recent Lipid Panel    Component Value Date/Time   CHOL 292 (H) 07/08/2022 0646   TRIG 109 07/08/2022 0646   HDL 39 (L) 07/08/2022 0646   CHOLHDL 7.5 07/08/2022 0646   VLDL 22 07/08/2022 0646   LDLCALC 231 (H) 07/08/2022 0646   LDLCALC 145 (H) 05/14/2020 0806   LDLDIRECT 176.0 07/15/2019 0837     Risk Assessment/Calculations:                Physical Exam:     Physical Exam: Blood pressure 122/76, pulse 89, height '5\' 9"'$  (1.753 m), weight 190 lb 6.4 oz (86.4 kg), SpO2 97 %.       GEN:  Well nourished, well developed in no acute distress HEENT: Normal NECK: No JVD; No carotid bruits LYMPHATICS: No lymphadenopathy CARDIAC: RRR , no murmurs, rubs, gallops RESPIRATORY:  Clear to auscultation without rales, wheezing or rhonchi  ABDOMEN: Soft, non-tender, non-distended MUSCULOSKELETAL:  No edema; No deformity  SKIN:  Warm and dry NEUROLOGIC:  Alert and oriented x 3   ASSESSMENT:    1. Mixed hyperlipidemia   2. Medication management     PLAN:      Tachypalpitations:  He complains of having episodes of tachypalpitations.  These occur several times a month.  He describes it as an irregular fast heart rate.  There is no syncope or presyncope.  There is no chest pain.  I would like to place a 14-day event monitor on him for further evaluation.  We also discussed having him use a  Kardia mobile device  2.  Hyperlipidemia: His LDL is 195.   We have started him on Repatha.  Will check lipids, LP(a), ALT, basic metabolic profile in about 2 months.  He will follow-up with with me or an APP in 1 year.          Medication Adjustments/Labs and Tests Ordered: Current medicines are reviewed at length with the patient today.  Concerns regarding medicines are outlined above.  Orders Placed This Encounter  Procedures   ALT   Basic metabolic panel   Lipid panel   Lipoprotein A (LPA)   No orders of the defined types were placed in this encounter.     Patient Instructions  Medication Instructions:  Your physician recommends that you continue on your current medications as directed. Please refer to the Current Medication list given to you today.  *If you need a refill on your cardiac medications before your next appointment, please call your pharmacy*  Lab Work: In 2 months: fasting Lipid panel, Lipoprotein a (LPa), ALT, BMP If you have labs (blood work) drawn today and your tests are completely normal, you will receive your results only by: [place] (if you have MyChart) OR A paper copy in the mail If you have any lab test that is abnormal or we need to change your treatment, we will call you  to review the results.  Testing/Procedures: None  Follow-Up: At [facility], you and your health needs are our priority.  As part of our continuing mission to provide you with exceptional  heart care, we have created designated Provider Care Teams.  These Care Teams include your primary Cardiologist (physician) and Advanced Practice Providers (APPs -  Physician Assistants and Nurse Practitioners) who all work together to provide you with the care you need, when you need it.  Your next appointment:   1 year(s)  Provider:   Mertie Moores, MD  or Christen Bame, NP or Richardson Dopp, PA-C    Signed, Mertie Moores, MD  09/25/2022 2:11 PM    [place]

## 2022-09-25 ENCOUNTER — Ambulatory Visit: Payer: Medicare PPO | Attending: Cardiovascular Disease | Admitting: Cardiovascular Disease

## 2022-09-25 ENCOUNTER — Encounter: Payer: Self-pay | Admitting: Cardiovascular Disease

## 2022-09-25 VITALS — BP 122/76 | HR 89 | Ht 69.0 in | Wt 190.4 lb

## 2022-09-25 DIAGNOSIS — Z79899 Other long term (current) drug therapy: Secondary | ICD-10-CM | POA: Diagnosis not present

## 2022-09-25 DIAGNOSIS — E782 Mixed hyperlipidemia: Secondary | ICD-10-CM

## 2022-09-25 NOTE — Patient Instructions (Signed)
Medication Instructions:  Your physician recommends that you continue on your current medications as directed. Please refer to the Current Medication list given to you today.  *If you need a refill on your cardiac medications before your next appointment, please call your pharmacy*  Lab Work: In 2 months: fasting Lipid panel, Lipoprotein a (LPa), ALT, BMP If you have labs (blood work) drawn today and your tests are completely normal, you will receive your results only by: [place] (if you have MyChart) OR A paper copy in the mail If you have any lab test that is abnormal or we need to change your treatment, we will call you to review the results.  Testing/Procedures: None  Follow-Up: At [facility], you and your health needs are our priority.  As part of our continuing mission to provide you with exceptional heart care, we have created designated Provider Care Teams.  These Care Teams include your primary Cardiologist (physician) and Advanced Practice Providers (APPs -  Physician Assistants and Nurse Practitioners) who all work together to provide you with the care you need, when you need it.  Your next appointment:   1 year(s)  Provider:   Mertie Moores, MD  or Christen Bame, NP or Richardson Dopp, PA-C

## 2022-09-29 ENCOUNTER — Encounter: Payer: Self-pay | Admitting: Psychiatry

## 2022-09-29 ENCOUNTER — Ambulatory Visit: Payer: Medicare PPO | Admitting: Psychiatry

## 2022-09-29 VITALS — Wt 180.0 lb

## 2022-09-29 DIAGNOSIS — F411 Generalized anxiety disorder: Secondary | ICD-10-CM

## 2022-09-29 DIAGNOSIS — Z1589 Genetic susceptibility to other disease: Secondary | ICD-10-CM

## 2022-09-29 DIAGNOSIS — F3163 Bipolar disorder, current episode mixed, severe, without psychotic features: Secondary | ICD-10-CM | POA: Diagnosis not present

## 2022-09-29 MED ORDER — HYDROXYZINE HCL 25 MG PO TABS: 25.0000 mg | ORAL_TABLET | Freq: Three times a day (TID) | ORAL | 1 refills | Status: DC | PRN

## 2022-09-29 MED ORDER — GABAPENTIN 300 MG PO CAPS: 300.0000 mg | ORAL_CAPSULE | Freq: Three times a day (TID) | ORAL | 1 refills | Status: DC

## 2022-09-29 MED ORDER — DEPLIN 15 15-90.314 MG PO CAPS: 15.0000 mg | ORAL_CAPSULE | Freq: Every day | ORAL | 3 refills | Status: DC

## 2022-09-29 MED ORDER — QUETIAPINE FUMARATE ER 300 MG PO TB24: 600.0000 mg | ORAL_TABLET | Freq: Every day | ORAL | 0 refills | Status: DC

## 2022-09-29 NOTE — Progress Notes (Unsigned)
Chase Hunter Chase Hunter [id number] [birth year] 62 y.o.  Subjective:   Patient ID:  Chase Hunter. is a 62 y.o. (DOB Sep 19, 1960) male.  Chief Complaint:  Chief Complaint  Patient presents with   Follow-up    Anxiety, mood disturbance    HPI [name]. presents to the office today for follow-up of anxiety, mood disturbance, and insomnia. He reports, "I feel very stable." He reports that anxiety has been "good." He reports that he had anxiety a couple of days. He reports that he has had a "still ness... calm." He reports that he attempted to reduce Seroquel to 500 mg and "I didn't like it" and noticed immediately some worsening mood lability and resumed 600 mg daily. He reports less irritability. He denies depression. He denies manic symptoms. "I'm enjoying my life" but not having extreme joy.   He reports that he would like to increase Gabapentin to 300 mg TID. He reports that he has taken this amount the last few days and finds that it has been effective and well-tolerated. He has been working with his band and "this is always a positive experience." He reports that his band has been sufficient for his social needs. He reports that he does not do well traveling and having his schedule disrupted.   Denies any recent panic attacks. He reports that intrusive thoughts are rare and immediately disappear.   Energy and motivation have been good. Not needing to nap. Sleeping 8-9 hours a night. Appetite has been ok. He reports that he has lost weight he has gained. Concentration has been "fine." Denies SI.   He and his wife have been keeping their grandson more and "this helps" in terms of having a purpose.   He has completed dermatology treatment   Past Psychiatric Medication Trials: Lamictal- Rash. Had seizure when it was stopped.  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 Abilify- cannot recall  response. Minimal improvement. Seroquel XR-Affective dulling, excessive somnolence. Took up to 600 mg QHS Seroquel Saphris- SI, uncontrolled crying Latuda Trazodone- Increased BP and suicidal thoughts Propranolol- Raised BP Clonidine- increased panic, crying, and SI Sertraline- Worsening mood symptoms and anxiety Lexapro Wellbutrin Duloxetine-excessively talkative. "Saying things out loud that I thought I was just thinking."  Buspar-Increased anxiety and increased BP.  Klonopin Diazepam- Paradoxical effect Hydroxyzine  AIMS    Flowsheet Row Office Visit from 09/29/2022 in [place] Video Visit from 09/05/2022 in [facility] Office Visit from 08/18/2022 in [facility] Office Visit from 07/28/2022 in [place] Office Visit from 07/16/2022 in [place] Total Score 0 0 0 0 0      PHQ2-9    [place] Office Visit from 08/16/2021 in [place] at [place] from 06/14/2021 in [facility] at [place] from 06/13/2020 in [facility] at [place]  PHQ-2 Total Score 5 2 0  PHQ-9 Total Score 20 12 --      [place] ED from 08/14/2021 in [facility] at [place] from 06/14/2021 in [place] at [place] No Risk Error: Q3, 4, or 5 should not be populated when Q2 is No        Review of Systems:  Review of Systems  Cardiovascular:  Negative  for palpitations.  Musculoskeletal:  Negative for gait problem.  Neurological:  Negative for tremors.  Psychiatric/Behavioral:         Please refer to HPI    Medications: I have reviewed the patient's current medications.  Current Outpatient Medications  Medication Sig Dispense Refill   albuterol (VENTOLIN  HFA) 108 (90 Base) MCG/ACT inhaler Inhale 2 puffs into the lungs every 6 (six) hours as needed.     amLODipine (NORVASC) 5 MG tablet Take 1 tablet (5 mg total) by mouth daily. 30 tablet 0   Evolocumab (REPATHA SURECLICK) 244 MG/ML SOAJ Inject 140 mg into the skin every 14 (fourteen) days. 6 mL 3   fluticasone (FLONASE) 50 MCG/ACT nasal spray Place 1 spray into both nostrils daily as needed.     lisinopril (ZESTRIL) 40 MG tablet Take 1 tablet (40 mg total) by mouth daily. 30 tablet 0   omega-3 acid ethyl esters (LOVAZA) 1 g capsule Take 1 capsule (1 g total) by mouth daily. 30 capsule 0   QUEtiapine (SEROQUEL XR) 300 MG 24 hr tablet Take 2 tablets (600 mg total) by mouth at bedtime. 180 tablet 0   testosterone cypionate (DEPOTESTOSTERONE CYPIONATE) 200 MG/ML injection Inject 0.5 mLs (100 mg total) into the muscle once a week. (Patient taking differently: Inject 200 mg into the muscle once a week.) 2.5 mL 3   Vitamin D, Ergocalciferol, 50000 units CAPS TAKE ONE CAPSULE BY MOUTH EVERY 7 DAYS 12 capsule 0   gabapentin (NEURONTIN) 300 MG capsule Take 1 capsule (300 mg total) by mouth 3 (three) times daily. 270 capsule 1   hydrOXYzine (ATARAX) 25 MG tablet Take 1 tablet (25 mg total) by mouth 3 (three) times daily as needed for anxiety. 30 tablet 1   L-Methylfolate-Algae (DEPLIN 15) 15-90.314 MG CAPS Take 15 mg by mouth daily. 90 capsule 3   No current facility-administered medications for this visit.    Medication Side Effects: None  Allergies:  Allergies  Allergen Reactions   Cephalexin Hives   Depakote [Divalproex Sodium]     Caused Pancreatitis   Trazodone And Nefazodone     Suicidal thoughts   Lamictal [Lamotrigine] Rash    Had a seizure when he stopped it   Lipitor [Atorvastatin] Rash    Other reaction(s): Other (See Comments) Memory issues that have not resolved    Past Medical History:  Diagnosis Date   Allergy    Anemia    Asthma    Bipolar II disorder ([place])    Depression     Gallstones    GERD (gastroesophageal reflux disease)    Hx of adenomatous colonic polyps    Hyperlipidemia    Hypertension    Pancreatitis    Seizures (HCC)    past hx 6 yrs ago x 1- coming off meds caused 1 seizure     Past Medical History, Surgical history, Social history, and Family history were reviewed and updated as appropriate.   Please see review of systems for further details on the patient's review from today.   Objective:   Physical Exam:  Wt 180 lb (81.6 kg)   BMI 26.58 kg/m   Physical Exam Constitutional:      General: He is not in acute distress. Musculoskeletal:        General: No deformity.  Neurological:     Mental Status: He is alert and oriented to person, place, and time.     Coordination: Coordination normal.  Psychiatric:  Attention and Perception: Attention and perception normal. He does not perceive auditory or visual hallucinations.        Mood and Affect: Mood normal. Mood is not anxious or depressed. Affect is not labile, blunt, angry or inappropriate.        Speech: Speech normal.        Behavior: Behavior normal.        Thought Content: Thought content normal. Thought content is not paranoid or delusional. Thought content does not include homicidal or suicidal ideation. Thought content does not include homicidal or suicidal plan.        Cognition and Memory: Cognition and memory normal.        Judgment: Judgment normal.     Comments: Insight intact     Lab Review:     Component Value Date/Time   NA 139 07/08/2022 0646   K 4.1 07/08/2022 0646   CL 104 07/08/2022 0646   CO2 24 07/08/2022 0646   GLUCOSE 102 (H) 07/08/2022 0646   BUN 20 07/08/2022 0646   CREATININE 0.90 07/08/2022 0646   CREATININE 0.96 05/14/2020 0806   CALCIUM 9.4 07/08/2022 0646   PROT 7.6 07/08/2022 0646   ALBUMIN 4.6 07/08/2022 0646   AST 19 07/08/2022 0646   ALT 19 07/08/2022 0646   ALKPHOS 53 07/08/2022 0646   BILITOT 1.1 07/08/2022 0646   GFRNONAA  >60 07/08/2022 0646   GFRNONAA 87 05/14/2020 0806   GFRAA 101 05/14/2020 0806       Component Value Date/Time   WBC 9.4 07/08/2022 0646   RBC 5.04 07/08/2022 0646   HGB 15.7 07/08/2022 0646   HCT 46.4 07/08/2022 0646   PLT 232 07/08/2022 0646   MCV 92.1 07/08/2022 0646   MCH 31.2 07/08/2022 0646   MCHC 33.8 07/08/2022 0646   RDW 11.9 07/08/2022 0646   LYMPHSABS 1.9 08/14/2021 1415   MONOABS 0.5 08/14/2021 1415   EOSABS 0.1 08/14/2021 1415   BASOSABS 0.1 08/14/2021 1415    No results found for: "POCLITH", "LITHIUM"   Lab Results  Component Value Date   VALPROATE 33.1 (L) 07/15/2019     .res Assessment: Plan:   Pt seen for 30 minutes and time spent discussing treatment plan. He reports that he would like to continue Seroquel XR 600 mg daily since he noticed worsening mood lability with attempt to decrease to 500 mg daily. Will change script to Seroquel XR 300 mg two tabs daily since plan is to continue 600 mg total daily dose. (He had previously been taking a Seroquel XR 200 mg along with remaining supply of Seroquel XR 400 mg tabs to equal total dose of 600 mg.  Will increase Gabapentin to 300 mg po TID for anxiety.  Continue Deplin 15 mg po qd for MTHFR mutation and mood. Continue Hydroxyzine prn anxiety.  Recommend continuing therapy with Rinaldo Cloud, LCSW.  Pt to follow-up with this provider in 4 weeks or sooner if clinically indicated.  Patient advised to contact office with any questions, adverse effects, or acute worsening in signs and symptoms.   Mareon was seen today for follow-up.  Diagnoses and all orders for this visit:  Bipolar disorder, current episode mixed, severe, without psychotic features ([place]) -     QUEtiapine (SEROQUEL XR) 300 MG 24 hr tablet; Take 2 tablets (600 mg total) by mouth at bedtime.  Generalized anxiety disorder -     gabapentin (NEURONTIN) 300 MG capsule; Take 1 capsule (300 mg total) by mouth 3 (three) times  daily. -     QUEtiapine  (SEROQUEL XR) 300 MG 24 hr tablet; Take 2 tablets (600 mg total) by mouth at bedtime. -     hydrOXYzine (ATARAX) 25 MG tablet; Take 1 tablet (25 mg total) by mouth 3 (three) times daily as needed for anxiety.  Heterozygous MTHFR mutation [id number] -     L-Methylfolate-Algae (DEPLIN 15) 15-90.314 MG CAPS; Take 15 mg by mouth daily.     Please see After Visit Summary for patient specific instructions.  Future Appointments  Date Time Provider [place]  09/30/2022  8:00 AM Shanon Ace, LCSW [facility] None  10/27/2022  8:30 AM Thayer Headings, PMHNP [facility] None  11/25/2022  9:00 AM [facility]    No orders of the defined types were placed in this encounter.   -------------------------------

## 2022-09-30 ENCOUNTER — Ambulatory Visit: Payer: Medicare PPO | Admitting: Psychiatry

## 2022-09-30 DIAGNOSIS — F411 Generalized anxiety disorder: Secondary | ICD-10-CM | POA: Diagnosis not present

## 2022-09-30 NOTE — Progress Notes (Signed)
[facility] Counselor/Therapist Progress Note  Patient ID: Chase Hunter., MRN: [id number],    Date: 09/30/2022  Time Spent: 50 minutes   Treatment Type: Individual Therapy  Reported Symptoms: Patient reports symptoms of anxiety and depression have decreased.  Believes his current meds have helped that and the way he is trying to manage things better.  Seems to recognize symptoms more quickly but also states they have decreased.  Mental Status Exam:  Appearance:   Casual and Neat     Behavior:  Appropriate, Sharing, and Motivated  Motor:  Normal  Speech/Language:   Clear and Coherent  Affect:  Depressed and anxious (improved)  Mood:  depressed  Thought process:  goal directed  Thought content:    WNL  Sensory/Perceptual disturbances:    WNL  Orientation:  oriented to person, place, time/date, situation, day of week, month of year, year, and stated date of Jan. 30, 2024  Attention:  Good  Concentration:  Good  Memory:  Some short term memory issues and Dr is aware  Fund of knowledge:   Good  Insight:    Good  Judgment:   Good/Fair  Impulse Control:  Good   Risk Assessment: Danger to Self:  No Self-injurious Behavior: No Danger to Others: No Duty to Warn:no Physical Aggression / Violence:No  Access to Firearms a concern: No  Gang Involvement:No   Subjective:   Patient in today reporting anxiety and depression but he has noticed a decrease in both and "it is more tolerable".  Actually experiences some happiness at times.  Particularly "bothered" by his brother and sister who tend to "ignore me" at times at other times "try to act elaborate" around patient.  Seem to be really focused on his siblings and himself with lots of comparisons noted especially with brother being "very smart and has a good job at [employer]  Was able to look at interpersonal issues with brother and sister from some different angles and the more he spoke and vented, the more rational he was  and the more he included some positive thoughts.  States "I feel like my mind tries to treat me that he sometimes but I do not let it".  Smiling appropriately and states he is feeling better than he has in quite a while.  Also shares he is needing to continue talking through issues especially long healed feelings about his brother and sister.  Does acknowledge that he is not holding onto the anger "like I used to".   Interventions: Cognitive Behavioral Therapy and Ego-Supportive  Long-term goal: Reduce overall level, frequency, and intensity of the depression and anxiety so that daily functioning is not impaired. Short-term goal: Verbalize an understanding of the role that fearful thinking plays in creating fears, excessive worry, and persistent anxiety/depressive symptoms. Strategies: Identify, challenge, and replace fearful self-talk with positive, realistic, and empowering self-talk.   Diagnosis:   ICD-10-CM   1. Generalized anxiety disorder  F41.1      Plan:  Patient today showing really good motivation and active participation in session reporting as noted above he has noticed an improvement now and recent couple of weeks and it is continuing currently.  Feeling more optimistic than he has in quite a while.  States feeling more emotionally stable is helping him in other ways as well.  Discussed ways of trying to maintain his gains.  Motivated to continue his progress.  To continue working with goal-directed behaviors as he wants to maintain and continue  his progress. Encouraged patient in practicing positive behaviors as discussed in session including staying in touch with people who are supportive, remaining in the present and focusing on what he can change versus cannot, use strategies for decreasing anxious thoughts as discussed in session, trying not to look for "things to worry about", looking for more positives versus negatives daily, remain on his prescribed medication, remain in contact  with others that feel comfortable to him, healthy nutrition and exercise, increase his positive self talk, be able to take timeouts as needed, and recognize the strength he shows working with goal-directed behaviors to move in a direction that supports his improved emotional health.  Goal review and progress/challenges noted with patient.  Next appt within 2 to 3 weeks.  This record has been created using [employer].  Chart creation errors have been sought, but may not always have been located and corrected.  Such creation errors do not reflect on the standard of medical care provided.   Shanon Ace, LCSW

## 2022-10-27 ENCOUNTER — Ambulatory Visit: Payer: Medicare PPO | Admitting: Psychiatry

## 2022-10-28 ENCOUNTER — Ambulatory Visit: Payer: Medicare PPO | Admitting: Psychiatry

## 2022-10-29 DIAGNOSIS — M545 Low back pain, unspecified: Secondary | ICD-10-CM | POA: Diagnosis not present

## 2022-11-04 ENCOUNTER — Ambulatory Visit: Payer: Medicare PPO | Admitting: Psychiatry

## 2022-11-04 ENCOUNTER — Encounter: Payer: Self-pay | Admitting: Psychiatry

## 2022-11-04 VITALS — Wt 178.0 lb

## 2022-11-04 DIAGNOSIS — F410 Panic disorder [episodic paroxysmal anxiety] without agoraphobia: Secondary | ICD-10-CM | POA: Diagnosis not present

## 2022-11-04 DIAGNOSIS — F411 Generalized anxiety disorder: Secondary | ICD-10-CM

## 2022-11-04 DIAGNOSIS — F3170 Bipolar disorder, currently in remission, most recent episode unspecified: Secondary | ICD-10-CM | POA: Diagnosis not present

## 2022-11-04 MED ORDER — HYDROXYZINE HCL 25 MG PO TABS: 25.0000 mg | ORAL_TABLET | Freq: Three times a day (TID) | ORAL | 1 refills | Status: DC | PRN

## 2022-11-04 NOTE — Progress Notes (Unsigned)
Chase Hunter Chase Hunter [id number] 09-May-1961 62 y.o.  Subjective:   Patient ID:  Chase Denhart. is a 62 y.o. (DOB 05-31-1961) male.  Chief Complaint: No chief complaint on file.   HPI Chase Brittan. presents to the office today for follow-up of *** He reports that Gabapentin has been helpful for his anxiety. He reports that anxiety is "very slight." Anxiety tends to happen when he is around others. Interacts some with other musicians. Interacts minimally with others. Denies panic. Denies depressed mood. Denies hypomania. He had a recent course of Prednisone and noticed some increase in goal-directed activity and otherwise denies any manic symptoms. Sleeping ok. Sleeping 8-9 hours a night. Appetite is stable. Energy and motivation have been ok. He reports that his back pain is less with moving around, so he has been staying active. Denies SI.   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   His grandson will be one year old next week. He and his wife keep their grandson 5 days a week.   Past Psychiatric Medication Trials: Lamictal- Rash. Had seizure when it was stopped.  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 Abilify- cannot recall response. Minimal improvement. Seroquel XR-Affective dulling, excessive somnolence. Took up to 600 mg QHS Seroquel Saphris- SI, uncontrolled crying Latuda Trazodone- Increased BP and suicidal thoughts Propranolol- Raised BP Clonidine- increased panic, crying, and SI Sertraline- Worsening mood symptoms and anxiety Lexapro Wellbutrin Duloxetine-excessively talkative. "Saying things out loud that I thought I was just thinking."  Buspar-Increased anxiety and increased BP.  Klonopin Diazepam- Paradoxical effect Hydroxyzine  AIMS    Flowsheet Row Office Visit from 11/04/2022 in [place] Office Visit from 09/29/2022 in  [place] Video Visit from 09/05/2022 in [facility] Office Visit from 08/18/2022 in [facility] Office Visit from 07/28/2022 in [facility] Total Score 0 0 0 0 0      PHQ2-9    [place] Office Visit from 08/16/2021 in [place] at [place] from 06/14/2021 in [place] [facility] at [place] from 06/13/2020 in [facility] at [place]  PHQ-2 Total Score 5 2 0  PHQ-9 Total Score 20 12 --      [place] ED from 08/14/2021 in [facility] at [place] from 06/14/2021 in [place] at [place] No Risk Error: Q3, 4, or 5 should not be populated when Q2 is No        Review of Systems:  Review of Systems  Musculoskeletal:  Positive for back pain. Negative for gait problem.  Neurological:  Negative for tremors.  Psychiatric/Behavioral:         Please refer to HPI    Medications: I have reviewed the patient's current medications.  Current Outpatient Medications  Medication Sig Dispense Refill   albuterol (VENTOLIN HFA) 108 (90 Base) MCG/ACT inhaler Inhale 2 puffs into the lungs every 6 (six) hours as needed.     amLODipine (NORVASC) 5 MG tablet Take 1 tablet (5 mg total) by mouth daily. 30 tablet 0   Evolocumab (REPATHA SURECLICK) [id number] MG/ML SOAJ Inject 140 mg into the skin every 14 (fourteen) days. 6 mL 3   fluticasone (FLONASE) 50 MCG/ACT nasal spray Place 1 spray  into both nostrils daily as needed.     gabapentin (NEURONTIN) 300 MG capsule Take 1 capsule (300 mg total) by mouth 3 (three) times daily. 270 capsule 1   hydrOXYzine (ATARAX) 25 MG tablet Take 1 tablet (25 mg total) by mouth 3 (three) times daily as needed for anxiety. 30 tablet 1   L-Methylfolate-Algae (DEPLIN 15) 15-90.314  MG CAPS Take 15 mg by mouth daily. 90 capsule 3   lisinopril (ZESTRIL) 40 MG tablet Take 1 tablet (40 mg total) by mouth daily. 30 tablet 0   methocarbamol (ROBAXIN) 500 MG tablet 1 tablet Orally every 6 hrs as needed for back pain for 30 days     omega-3 acid ethyl esters (LOVAZA) 1 g capsule Take 1 capsule (1 g total) by mouth daily. 30 capsule 0   QUEtiapine (SEROQUEL XR) 300 MG 24 hr tablet Take 2 tablets (600 mg total) by mouth at bedtime. 180 tablet 0   testosterone cypionate (DEPOTESTOSTERONE CYPIONATE) 200 MG/ML injection Inject 0.5 mLs (100 mg total) into the muscle once a week. (Patient taking differently: Inject 200 mg into the muscle once a week.) 2.5 mL 3   Vitamin D, Ergocalciferol, 50000 units CAPS TAKE ONE CAPSULE BY MOUTH EVERY 7 DAYS 12 capsule 0   No current facility-administered medications for this visit.    Medication Side Effects: None  Allergies:  Allergies  Allergen Reactions   Cephalexin Hives   Depakote [Divalproex Sodium]     Caused Pancreatitis   Trazodone And Nefazodone     Suicidal thoughts   Lamictal [Lamotrigine] Rash    Had a seizure when he stopped it   Lipitor [Atorvastatin] Rash    Other reaction(s): Other (See Comments) Memory issues that have not resolved    Past Medical History:  Diagnosis Date   Allergy    Anemia    Asthma    Bipolar II disorder ([place])    Depression    Gallstones    GERD (gastroesophageal reflux disease)    Hx of adenomatous colonic polyps    Hyperlipidemia    Hypertension    Pancreatitis    Seizures (HCC)    past hx 6 yrs ago x 1- coming off meds caused 1 seizure     Past Medical History, Surgical history, Social history, and Family history were reviewed and updated as appropriate.   Please see review of systems for further details on the patient's review from today.   Objective:   Physical Exam:  Wt 178 lb (80.7 kg)   BMI 26.29 kg/m   Physical Exam Constitutional:      General: He is not in acute  distress. Musculoskeletal:        General: No deformity.  Neurological:     Mental Status: He is alert and oriented to person, place, and time.     Coordination: Coordination normal.  Psychiatric:        Attention and Perception: Attention and perception normal. He does not perceive auditory or visual hallucinations.        Mood and Affect: Mood normal. Mood is not anxious or depressed. Affect is not labile, blunt, angry or inappropriate.        Speech: Speech normal.        Behavior: Behavior normal.        Thought Content: Thought content normal. Thought content is not paranoid or delusional. Thought content does not include homicidal or suicidal ideation. Thought content does not include homicidal or suicidal plan.  Cognition and Memory: Cognition and memory normal.        Judgment: Judgment normal.     Comments: Insight intact     Lab Review:     Component Value Date/Time   NA 139 07/08/2022 0646   K 4.1 07/08/2022 0646   CL 104 07/08/2022 0646   CO2 24 07/08/2022 0646   GLUCOSE 102 (H) 07/08/2022 0646   BUN 20 07/08/2022 0646   CREATININE 0.90 07/08/2022 0646   CREATININE 0.96 05/14/2020 0806   CALCIUM 9.4 07/08/2022 0646   PROT 7.6 07/08/2022 0646   ALBUMIN 4.6 07/08/2022 0646   AST 19 07/08/2022 0646   ALT 19 07/08/2022 0646   ALKPHOS 53 07/08/2022 0646   BILITOT 1.1 07/08/2022 0646   GFRNONAA >60 07/08/2022 0646   GFRNONAA 87 05/14/2020 0806   GFRAA 101 05/14/2020 0806       Component Value Date/Time   WBC 9.4 07/08/2022 0646   RBC 5.04 07/08/2022 0646   HGB 15.7 07/08/2022 0646   HCT 46.4 07/08/2022 0646   PLT 232 07/08/2022 0646   MCV 92.1 07/08/2022 0646   MCH 31.2 07/08/2022 0646   MCHC 33.8 07/08/2022 0646   RDW 11.9 07/08/2022 0646   LYMPHSABS 1.9 08/14/2021 1415   MONOABS 0.5 08/14/2021 1415   EOSABS 0.1 08/14/2021 1415   BASOSABS 0.1 08/14/2021 1415    No results found for: "POCLITH", "LITHIUM"   Lab Results  Component Value Date    VALPROATE 33.1 (L) 07/15/2019     .res Assessment: Plan:    There are no diagnoses linked to this encounter.   Please see After Visit Summary for patient specific instructions.  Future Appointments  Date Time Provider [place]  11/25/2022  9:00 AM [facility]    No orders of the defined types were placed in this encounter.   -------------------------------

## 2022-11-06 DIAGNOSIS — L570 Actinic keratosis: Secondary | ICD-10-CM | POA: Diagnosis not present

## 2022-11-06 DIAGNOSIS — Z85828 Personal history of other malignant neoplasm of skin: Secondary | ICD-10-CM | POA: Diagnosis not present

## 2022-11-06 DIAGNOSIS — Z08 Encounter for follow-up examination after completed treatment for malignant neoplasm: Secondary | ICD-10-CM | POA: Diagnosis not present

## 2022-11-06 DIAGNOSIS — L578 Other skin changes due to chronic exposure to nonionizing radiation: Secondary | ICD-10-CM | POA: Diagnosis not present

## 2022-11-11 DIAGNOSIS — M545 Low back pain, unspecified: Secondary | ICD-10-CM | POA: Diagnosis not present

## 2022-11-25 ENCOUNTER — Ambulatory Visit: Payer: Medicare PPO | Attending: Cardiovascular Disease

## 2022-11-25 DIAGNOSIS — E782 Mixed hyperlipidemia: Secondary | ICD-10-CM | POA: Diagnosis not present

## 2022-11-25 DIAGNOSIS — Z79899 Other long term (current) drug therapy: Secondary | ICD-10-CM

## 2022-11-26 LAB — BASIC METABOLIC PANEL
BUN/Creatinine Ratio: 16 (ref 10–24)
BUN: 16 mg/dL (ref 8–27)
CO2: 22 mmol/L (ref 20–29)
Calcium: 9.4 mg/dL (ref 8.6–10.2)
Chloride: 104 mmol/L (ref 96–106)
Creatinine, Ser: 1.01 mg/dL (ref 0.76–1.27)
Glucose: 101 mg/dL — ABNORMAL HIGH (ref 70–99)
Potassium: 4.4 mmol/L (ref 3.5–5.2)
Sodium: 143 mmol/L (ref 134–144)
eGFR: 85 mL/min/{1.73_m2} (ref >59)

## 2022-11-26 LAB — LIPID PANEL
Chol/HDL Ratio: 2.5 ratio (ref 0.0–5.0)
Cholesterol, Total: 131 mg/dL (ref 100–199)
HDL: 52 mg/dL (ref >39)
LDL Chol Calc (NIH): 63 mg/dL (ref 0–99)
Triglycerides: 83 mg/dL (ref 0–149)
VLDL Cholesterol Cal: 16 mg/dL (ref 5–40)

## 2022-11-26 LAB — ALT: ALT: 46 IU/L — ABNORMAL HIGH (ref 0–44)

## 2022-11-26 LAB — LIPOPROTEIN A (LPA): Lipoprotein (a): 17.3 nmol/L (ref <75.0)

## 2022-11-27 ENCOUNTER — Encounter: Payer: Self-pay | Admitting: Cardiovascular Disease

## 2022-11-27 DIAGNOSIS — E782 Mixed hyperlipidemia: Secondary | ICD-10-CM

## 2022-11-27 DIAGNOSIS — Z79899 Other long term (current) drug therapy: Secondary | ICD-10-CM

## 2022-12-01 ENCOUNTER — Ambulatory Visit: Payer: Medicare PPO | Attending: Cardiovascular Disease

## 2022-12-01 DIAGNOSIS — Z79899 Other long term (current) drug therapy: Secondary | ICD-10-CM

## 2022-12-01 DIAGNOSIS — E782 Mixed hyperlipidemia: Secondary | ICD-10-CM | POA: Diagnosis not present

## 2022-12-02 LAB — HEPATIC FUNCTION PANEL
ALT: 22 IU/L (ref 0–44)
AST: 17 IU/L (ref 0–40)
Albumin: 4.7 g/dL (ref 3.9–4.9)
Alkaline Phosphatase: 70 IU/L (ref 44–121)
Bilirubin Total: 0.3 mg/dL (ref 0.0–1.2)
Bilirubin, Direct: <0.1 mg/dL (ref 0.00–0.40)
Total Protein: 6.9 g/dL (ref 6.0–8.5)

## 2022-12-16 ENCOUNTER — Ambulatory Visit (INDEPENDENT_AMBULATORY_CARE_PROVIDER_SITE_OTHER): Payer: Medicare PPO | Admitting: Psychiatry

## 2022-12-16 ENCOUNTER — Encounter: Payer: Self-pay | Admitting: Psychiatry

## 2022-12-16 DIAGNOSIS — F3163 Bipolar disorder, current episode mixed, severe, without psychotic features: Secondary | ICD-10-CM

## 2022-12-16 DIAGNOSIS — F411 Generalized anxiety disorder: Secondary | ICD-10-CM

## 2022-12-16 MED ORDER — HYDROXYZINE HCL 25 MG PO TABS: 25.0000 mg | ORAL_TABLET | Freq: Three times a day (TID) | ORAL | 1 refills | Status: DC | PRN

## 2022-12-16 MED ORDER — QUETIAPINE FUMARATE ER 300 MG PO TB24: 600.0000 mg | ORAL_TABLET | Freq: Every day | ORAL | 0 refills | Status: DC

## 2022-12-16 NOTE — Progress Notes (Unsigned)
Chase Hunter [id number] 24-Sep-1960 62 y.o.  Subjective:   Patient ID:  Chase Hunter. is a 62 y.o. (DOB Dec 08, 1960) male.  Chief Complaint: No chief complaint on file.   HPI Chase Hunter. presents to the office today for follow-up of Bipolar disorder, anxiety, and insomnia. He reports "I'm doing fine... very steady." He notices some concerns about cognition/ ST memory. He reports that he may be practicing music and talk about a detail, and then forgets which song he was talking about. He reports that he is not forgetting names or important information. He reports that he may be noticing cognitive issues more now that his mood and anxiety symptoms have been more stable. He reports that "I suspect they don't realize how bad it is" in terms of ST. He reports adequate concentration. Denies difficulty with LT memory. He reports that Gabapentin is helpful for anxiety. Denies depressed mood. Denies manic symptoms. Sleeping well and averaging about 9-10 hours. Denies grogginess upon awakening. He reports that his appetite is slightly lower. Denies SI.   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 "All these things I do, I could not do alone. I'm being steered" by others and not involved with planning and logistics. 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   Continues to have grandson 5 days a week with his wife.   Past Psychiatric Medication Trials: Lamictal- Rash. Had seizure when it was stopped.  Depakote- Pancreatitis Lithium- Family commented that he "seemed like a zombieEducation officer, museum- Started about 6 weeks ago. Abilify- cannot recall response. Minimal improvement. Seroquel XR-Affective dulling, excessive somnolence. Took up to 600 mg QHS Seroquel Saphris- SI, uncontrolled crying Latuda Trazodone- Increased BP and suicidal thoughts Propranolol- Raised BP Clonidine- increased panic,  crying, and SI Sertraline- Worsening mood symptoms and anxiety Lexapro Wellbutrin Duloxetine-excessively talkative. "Saying things out loud that I thought I was just thinking."  Buspar-Increased anxiety and increased BP.  Klonopin Diazepam- Paradoxical effect Hydroxyzine  AIMS    Flowsheet Row Office Visit from 11/04/2022 in [facility] Office Visit from 09/29/2022 in [facility] Video Visit from 09/05/2022 in [facility] Office Visit from 08/18/2022 in [facility] Office Visit from 07/28/2022 in [facility]  AIMS Total Score 0 0 0 0 0      PHQ2-9    Flowsheet Row Office Visit from 08/16/2021 in [facility] [place] Clinical Support from 06/14/2021 in [facility] [place] Office Visit from 06/13/2020 in [facility] [place]  PHQ-2 Total Score 5 2 0  PHQ-9 Total Score 20 12 --      Flowsheet Row ED from 08/14/2021 in [facility] Clinical Support from 06/14/2021 in [facility] [place]  C-SSRS RISK CATEGORY No Risk Error: Q3, 4, or 5 should not be populated when Q2 is No        Review of Systems:  Review of Systems  Cardiovascular:  Negative for palpitations.  Musculoskeletal:  Negative for gait problem.  Neurological:  Negative for tremors.  Psychiatric/Behavioral:         Please refer to HPI    Medications: I have reviewed the patient's current medications.  Current Outpatient Medications  Medication Sig Dispense Refill   albuterol (VENTOLIN  HFA) 108 (90 Base) MCG/ACT inhaler Inhale 2 puffs into the lungs every 6 (six) hours as needed.     amLODipine (NORVASC) 5 MG tablet Take 1 tablet (5 mg total) by mouth daily. 30 tablet 0   Evolocumab (REPATHA SURECLICK) 140 MG/ML SOAJ Inject 140 mg into the  skin every 14 (fourteen) days. 6 mL 3   fluticasone (FLONASE) 50 MCG/ACT nasal spray Place 1 spray into both nostrils daily as needed.     gabapentin (NEURONTIN) 300 MG capsule Take 1 capsule (300 mg total) by mouth 3 (three) times daily. 270 capsule 1   hydrOXYzine (ATARAX) 25 MG tablet Take 1 tablet (25 mg total) by mouth 3 (three) times daily as needed for anxiety. 30 tablet 1   L-Methylfolate-Algae (DEPLIN 15) 15-90.314 MG CAPS Take 15 mg by mouth daily. 90 capsule 3   lisinopril (ZESTRIL) 40 MG tablet Take 1 tablet (40 mg total) by mouth daily. 30 tablet 0   methocarbamol (ROBAXIN) 500 MG tablet 1 tablet Orally every 6 hrs as needed for back pain for 30 days     omega-3 acid ethyl esters (LOVAZA) 1 g capsule Take 1 capsule (1 g total) by mouth daily. 30 capsule 0   QUEtiapine (SEROQUEL XR) 300 MG 24 hr tablet Take 2 tablets (600 mg total) by mouth at bedtime. 180 tablet 0   testosterone cypionate (DEPOTESTOSTERONE CYPIONATE) 200 MG/ML injection Inject 0.5 mLs (100 mg total) into the muscle once a week. (Patient taking differently: Inject 200 mg into the muscle once a week.) 2.5 mL 3   Vitamin D, Ergocalciferol, 50000 units CAPS TAKE ONE CAPSULE BY MOUTH EVERY 7 DAYS 12 capsule 0   No current facility-administered medications for this visit.    Medication Side Effects: None  Allergies:  Allergies  Allergen Reactions   Cephalexin Hives   Depakote [Divalproex Sodium]     Caused Pancreatitis   Trazodone And Nefazodone     Suicidal thoughts   Lamictal [Lamotrigine] Rash    Had a seizure when he stopped it   Lipitor [Atorvastatin] Rash    Other reaction(s): Other (See Comments) Memory issues that have not resolved    Past Medical History:  Diagnosis Date   Allergy    Anemia    Asthma    Bipolar II disorder (HCC)    Depression    Gallstones    GERD (gastroesophageal reflux disease)    Hx of adenomatous colonic polyps    Hyperlipidemia    Hypertension    Pancreatitis     Seizures (HCC)    past hx 6 yrs ago x 1- coming off meds caused 1 seizure     Past Medical History, Surgical history, Social history, and Family history were reviewed and updated as appropriate.   Please see review of systems for further details on the patient's review from today.   Objective:   Physical Exam:  There were no vitals taken for this visit.  Physical Exam Constitutional:      General: He is not in acute distress. Musculoskeletal:        General: No deformity.  Neurological:     Mental Status: He is alert and oriented to person, place, and time.     Coordination: Coordination normal.  Psychiatric:        Attention and Perception: Attention and perception normal. He does not perceive auditory or visual hallucinations.        Mood and Affect: Mood normal. Mood is not anxious or depressed.  Affect is not labile, blunt, angry or inappropriate.        Speech: Speech normal.        Behavior: Behavior normal.        Thought Content: Thought content normal. Thought content is not paranoid or delusional. Thought content does not include homicidal or suicidal ideation. Thought content does not include homicidal or suicidal plan.        Cognition and Memory: Cognition and memory normal.        Judgment: Judgment normal.     Comments: Insight intact     Lab Review:     Component Value Date/Time   NA 143 11/25/2022 0854   K 4.4 11/25/2022 0854   CL 104 11/25/2022 0854   CO2 22 11/25/2022 0854   GLUCOSE 101 (H) 11/25/2022 0854   GLUCOSE 102 (H) 07/08/2022 0646   BUN 16 11/25/2022 0854   CREATININE 1.01 11/25/2022 0854   CREATININE 0.96 05/14/2020 0806   CALCIUM 9.4 11/25/2022 0854   PROT 6.9 12/01/2022 0731   ALBUMIN 4.7 12/01/2022 0731   AST 17 12/01/2022 0731   ALT 22 12/01/2022 0731   ALKPHOS 70 12/01/2022 0731   BILITOT 0.3 12/01/2022 0731   GFRNONAA >60 07/08/2022 0646   GFRNONAA 87 05/14/2020 0806   GFRAA 101 05/14/2020 0806       Component Value Date/Time    WBC 9.4 07/08/2022 0646   RBC 5.04 07/08/2022 0646   HGB 15.7 07/08/2022 0646   HCT 46.4 07/08/2022 0646   PLT 232 07/08/2022 0646   MCV 92.1 07/08/2022 0646   MCH 31.2 07/08/2022 0646   MCHC 33.8 07/08/2022 0646   RDW 11.9 07/08/2022 0646   LYMPHSABS 1.9 08/14/2021 1415   MONOABS 0.5 08/14/2021 1415   EOSABS 0.1 08/14/2021 1415   BASOSABS 0.1 08/14/2021 1415    No results found for: "POCLITH", "LITHIUM"   Lab Results  Component Value Date   VALPROATE 33.1 (L) 07/15/2019     .res Assessment: Plan:    There are no diagnoses linked to this encounter.   Please see After Visit Summary for patient specific instructions.  Future Appointments  Date Time Provider Department Center  12/16/2022  1:15 PM Corie Chiquito, PMHNP [facility] None    No orders of the defined types were placed in this encounter.   -------------------------------

## 2023-01-01 DIAGNOSIS — I10 Essential (primary) hypertension: Secondary | ICD-10-CM | POA: Diagnosis not present

## 2023-01-01 DIAGNOSIS — H6121 Impacted cerumen, right ear: Secondary | ICD-10-CM | POA: Diagnosis not present

## 2023-01-01 DIAGNOSIS — Z1331 Encounter for screening for depression: Secondary | ICD-10-CM | POA: Diagnosis not present

## 2023-01-01 DIAGNOSIS — E559 Vitamin D deficiency, unspecified: Secondary | ICD-10-CM | POA: Diagnosis not present

## 2023-01-01 DIAGNOSIS — E7800 Pure hypercholesterolemia, unspecified: Secondary | ICD-10-CM | POA: Diagnosis not present

## 2023-01-01 DIAGNOSIS — H6122 Impacted cerumen, left ear: Secondary | ICD-10-CM | POA: Diagnosis not present

## 2023-01-01 DIAGNOSIS — F419 Anxiety disorder, unspecified: Secondary | ICD-10-CM | POA: Diagnosis not present

## 2023-01-01 DIAGNOSIS — F3181 Bipolar II disorder: Secondary | ICD-10-CM | POA: Diagnosis not present

## 2023-01-01 DIAGNOSIS — Z0000 Encounter for general adult medical examination without abnormal findings: Secondary | ICD-10-CM | POA: Diagnosis not present

## 2023-01-20 ENCOUNTER — Ambulatory Visit (INDEPENDENT_AMBULATORY_CARE_PROVIDER_SITE_OTHER): Payer: Medicare PPO | Admitting: Psychiatry

## 2023-01-20 ENCOUNTER — Encounter: Payer: Self-pay | Admitting: Psychiatry

## 2023-01-20 DIAGNOSIS — F410 Panic disorder [episodic paroxysmal anxiety] without agoraphobia: Secondary | ICD-10-CM | POA: Diagnosis not present

## 2023-01-20 DIAGNOSIS — F3163 Bipolar disorder, current episode mixed, severe, without psychotic features: Secondary | ICD-10-CM | POA: Diagnosis not present

## 2023-01-20 DIAGNOSIS — F411 Generalized anxiety disorder: Secondary | ICD-10-CM

## 2023-01-20 MED ORDER — GABAPENTIN 300 MG PO CAPS: 300.0000 mg | ORAL_CAPSULE | Freq: Three times a day (TID) | ORAL | 1 refills | Status: DC

## 2023-01-20 MED ORDER — HYDROXYZINE HCL 25 MG PO TABS: 25.0000 mg | ORAL_TABLET | Freq: Three times a day (TID) | ORAL | 1 refills | Status: DC | PRN

## 2023-01-20 MED ORDER — QUETIAPINE FUMARATE ER 300 MG PO TB24: 600.0000 mg | ORAL_TABLET | Freq: Every day | ORAL | 0 refills | Status: DC

## 2023-01-20 NOTE — Progress Notes (Unsigned)
Chase Hunter Chase Hunter [id number] Aug 28, 1961 62 y.o.  Subjective:   Patient ID:  Chase Hunter. is a 62 y.o. (DOB [birth year]) male.  Chief Complaint: No chief complaint on file.   HPI Chase Hunter. presents to the office today for follow-up of Bipolar disorder and anxiety. He reports, "Nothing much has changed. I am still doing very well." He reports that his anxiety has been "ok."  He had to go to a funeral by himself for his cousin in [place]. He reports that in retrospect, he would like to travel with a family member instead of alone. He noticed that he was analyzing what he just said. He reports that they will be traveling to [facility] next month to see family. He reports that travel disrupts his routines.   He reports that he has had "a couple" of panic attacks since last visit without apparent trigger. He has taken a small amount of Seroquel at times when this occurs. He denies any recent depression. Denies any significant irritability. Denies any manic s/s to include risky or impulsive behavior. He reports that he is able to concentrate and focus on music. He enjoys music, both working with others and individually. Sleeping well with Seroquel. Sleeping 8-9 hours a night. Appetite has been ok. He reports that he no longer "wants to eat all the time." Energy and motivation have been good. Denies SI.   He and his wife continue to help with grandson.     Past Psychiatric Medication Trials: Lamictal- Rash. Had seizure when it was stopped.  Depakote- Pancreatitis Lithium- Family commented that he "seemed like a zombieEducation officer, museum- Started about 6 weeks ago. Abilify- cannot recall response. Minimal improvement. Seroquel XR-Affective dulling, excessive somnolence. Took up to 600 mg QHS Seroquel Saphris- SI, uncontrolled crying Latuda Trazodone- Increased BP and suicidal thoughts Propranolol- Raised BP Clonidine- increased panic, crying, and SI Sertraline- Worsening mood  symptoms and anxiety Lexapro Wellbutrin Duloxetine-excessively talkative. "Saying things out loud that I thought I was just thinking."  Buspar-Increased anxiety and increased BP.  Klonopin Diazepam- Paradoxical effect Hydroxyzine    AIMS    Flowsheet Row Office Visit from 11/04/2022 in [facility] Office Visit from 09/29/2022 in [facility] Video Visit from 09/05/2022 in [facility] Office Visit from 08/18/2022 in [facility] Office Visit from 07/28/2022 in [facility]  AIMS Total Score 0 0 0 0 0      PHQ2-9    Flowsheet Row Office Visit from 08/16/2021 in [facility] [place] Clinical Support from 06/14/2021 in [facility] [place] Office Visit from 06/13/2020 in [facility] [place] [facility] [place]  PHQ-2 Total Score 5 2 0  PHQ-9 Total Score 20 12 --      Flowsheet Row ED from 08/14/2021 in [facility] Clinical Support from 06/14/2021 in [facility] [place]  C-SSRS RISK CATEGORY No Risk Error: Q3, 4, or 5 should not be populated when Q2 is No        Review of Systems:  Review of Systems  Cardiovascular:  Negative for palpitations.  Musculoskeletal:  Negative for gait problem.  Neurological:  Negative for tremors.  Psychiatric/Behavioral:         Please refer to HPI    Medications: I have reviewed the patient's current medications.  Current Outpatient Medications  Medication Sig Dispense Refill  albuterol (VENTOLIN HFA) 108 (90 Base) MCG/ACT inhaler Inhale 2 puffs into the lungs every 6 (six) hours as needed.     amLODipine (NORVASC) 5 MG tablet Take 1 tablet (5 mg total) by mouth daily. 30 tablet 0   Evolocumab (REPATHA SURECLICK) 140 MG/ML SOAJ Inject 140 mg into the skin every 14 (fourteen) days. 6 mL 3    fluticasone (FLONASE) 50 MCG/ACT nasal spray Place 1 spray into both nostrils daily as needed.     gabapentin (NEURONTIN) 300 MG capsule Take 1 capsule (300 mg total) by mouth 3 (three) times daily. 270 capsule 1   hydrOXYzine (ATARAX) 25 MG tablet Take 1 tablet (25 mg total) by mouth 3 (three) times daily as needed for anxiety. 30 tablet 1   L-Methylfolate-Algae (DEPLIN 15) 15-90.314 MG CAPS Take 15 mg by mouth daily. 90 capsule 3   lisinopril (ZESTRIL) 40 MG tablet Take 1 tablet (40 mg total) by mouth daily. 30 tablet 0   methocarbamol (ROBAXIN) 500 MG tablet 1 tablet Orally every 6 hrs as needed for back pain for 30 days     omega-3 acid ethyl esters (LOVAZA) 1 g capsule Take 1 capsule (1 g total) by mouth daily. 30 capsule 0   QUEtiapine (SEROQUEL XR) 300 MG 24 hr tablet Take 2 tablets (600 mg total) by mouth at bedtime. 180 tablet 0   testosterone cypionate (DEPOTESTOSTERONE CYPIONATE) 200 MG/ML injection Inject 0.5 mLs (100 mg total) into the muscle once a week. (Patient taking differently: Inject 200 mg into the muscle once a week.) 2.5 mL 3   Vitamin D, Ergocalciferol, 50000 units CAPS TAKE ONE CAPSULE BY MOUTH EVERY 7 DAYS 12 capsule 0   No current facility-administered medications for this visit.    Medication Side Effects: None  Allergies:  Allergies  Allergen Reactions   Cephalexin Hives   Depakote [Divalproex Sodium]     Caused Pancreatitis   Trazodone And Nefazodone     Suicidal thoughts   Lamictal [Lamotrigine] Rash    Had a seizure when he stopped it   Lipitor [Atorvastatin] Rash    Other reaction(s): Other (See Comments) Memory issues that have not resolved    Past Medical History:  Diagnosis Date   Allergy    Anemia    Asthma    Bipolar II disorder (HCC)    Depression    Gallstones    GERD (gastroesophageal reflux disease)    Hx of adenomatous colonic polyps    Hyperlipidemia    Hypertension    Pancreatitis    Seizures (HCC)    past hx 6 yrs ago x 1-  coming off meds caused 1 seizure     Past Medical History, Surgical history, Social history, and Family history were reviewed and updated as appropriate.   Please see review of systems for further details on the patient's review from today.   Objective:   Physical Exam:  There were no vitals taken for this visit.  Physical Exam  Lab Review:     Component Value Date/Time   NA 143 11/25/2022 0854   K 4.4 11/25/2022 0854   CL 104 11/25/2022 0854   CO2 22 11/25/2022 0854   GLUCOSE 101 (H) 11/25/2022 0854   GLUCOSE 102 (H) 07/08/2022 0646   BUN 16 11/25/2022 0854   CREATININE 1.01 11/25/2022 0854   CREATININE 0.96 05/14/2020 0806   CALCIUM 9.4 11/25/2022 0854   PROT 6.9 12/01/2022 0731   ALBUMIN 4.7 12/01/2022 0731   AST 17 12/01/2022  0731   ALT 22 12/01/2022 0731   ALKPHOS 70 12/01/2022 0731   BILITOT 0.3 12/01/2022 0731   GFRNONAA >60 07/08/2022 0646   GFRNONAA 87 05/14/2020 0806   GFRAA 101 05/14/2020 0806       Component Value Date/Time   WBC 9.4 07/08/2022 0646   RBC 5.04 07/08/2022 0646   HGB 15.7 07/08/2022 0646   HCT 46.4 07/08/2022 0646   PLT 232 07/08/2022 0646   MCV 92.1 07/08/2022 0646   MCH 31.2 07/08/2022 0646   MCHC 33.8 07/08/2022 0646   RDW 11.9 07/08/2022 0646   LYMPHSABS 1.9 08/14/2021 1415   MONOABS 0.5 08/14/2021 1415   EOSABS 0.1 08/14/2021 1415   BASOSABS 0.1 08/14/2021 1415    No results found for: "POCLITH", "LITHIUM"   Lab Results  Component Value Date   VALPROATE 33.1 (L) 07/15/2019     .res Assessment: Plan:    There are no diagnoses linked to this encounter.   Please see After Visit Summary for patient specific instructions.  No future appointments.  No orders of the defined types were placed in this encounter.   -------------------------------

## 2023-01-21 ENCOUNTER — Telehealth: Payer: Self-pay | Admitting: Psychiatry

## 2023-01-21 DIAGNOSIS — E559 Vitamin D deficiency, unspecified: Secondary | ICD-10-CM

## 2023-01-21 MED ORDER — VITAMIN D (ERGOCALCIFEROL) 50000 UNITS PO CAPS: ORAL_CAPSULE | ORAL | 0 refills | Status: DC

## 2023-01-21 NOTE — Telephone Encounter (Signed)
Chase Hunter was seen today and his pharmacy doesn't have Vitamin D 40981 in stock. This is Chase Hunter. He would like it called in to:  [facility] on [address] and [address], [place], [place]. Phone number is  [phone]

## 2023-01-21 NOTE — Telephone Encounter (Signed)
Sent!

## 2023-02-12 DIAGNOSIS — Z125 Encounter for screening for malignant neoplasm of prostate: Secondary | ICD-10-CM | POA: Diagnosis not present

## 2023-02-12 DIAGNOSIS — E291 Testicular hypofunction: Secondary | ICD-10-CM | POA: Diagnosis not present

## 2023-03-03 ENCOUNTER — Encounter: Payer: Self-pay | Admitting: Psychiatry

## 2023-03-03 ENCOUNTER — Telehealth (INDEPENDENT_AMBULATORY_CARE_PROVIDER_SITE_OTHER): Payer: Medicare PPO | Admitting: Psychiatry

## 2023-03-03 DIAGNOSIS — F411 Generalized anxiety disorder: Secondary | ICD-10-CM | POA: Diagnosis not present

## 2023-03-03 DIAGNOSIS — F310 Bipolar disorder, current episode hypomanic: Secondary | ICD-10-CM | POA: Diagnosis not present

## 2023-03-03 MED ORDER — QUETIAPINE FUMARATE ER 300 MG PO TB24: 600.0000 mg | ORAL_TABLET | Freq: Every day | ORAL | 0 refills | Status: DC
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
## 2023-04-03 ENCOUNTER — Telehealth (INDEPENDENT_AMBULATORY_CARE_PROVIDER_SITE_OTHER): Payer: Medicare PPO | Admitting: Psychiatry

## 2023-04-03 ENCOUNTER — Encounter: Payer: Self-pay | Admitting: Psychiatry

## 2023-04-03 DIAGNOSIS — F3170 Bipolar disorder, currently in remission, most recent episode unspecified: Secondary | ICD-10-CM | POA: Diagnosis not present

## 2023-04-03 DIAGNOSIS — F410 Panic disorder [episodic paroxysmal anxiety] without agoraphobia: Secondary | ICD-10-CM

## 2023-04-03 DIAGNOSIS — F411 Generalized anxiety disorder: Secondary | ICD-10-CM

## 2023-04-03 NOTE — Progress Notes (Signed)
Chase Hunter [id number] 1960/11/24 62 y.o.  Virtual Visit via Video Note  I connected with pt @ on 04/03/23 at  8:30 AM EDT by a video enabled telemedicine application and verified that I am speaking with the correct person using two identifiers.   I discussed the limitations of evaluation and management by telemedicine and the availability of in person appointments. The patient expressed understanding and agreed to proceed.  I discussed the assessment and treatment plan with the patient. The patient was provided an opportunity to ask questions and all were answered. The patient agreed with the plan and demonstrated an understanding of the instructions.   The patient was advised to call back or seek an in-person evaluation if the symptoms worsen or if the condition fails to improve as anticipated.  I provided 6 minutes of non-face-to-face time during this encounter.  The patient was located at home.  The provider was located at [facility].   Corie Chiquito, PMHNP   Subjective:   Patient ID:  Chase Hunter. is a 61 y.o. (DOB 1961/08/11) male.  Chief Complaint:  Chief Complaint  Patient presents with   Follow-up    Bipolar disorder, anxiety    HPI Chase Hunter. presents for follow-up of mood disorder and anxiety. He reports that he is "doing fine." Denies depressed mood. Denies manic symptoms. Denies anxiety. He reports that he is avoiding certain situations and delegating certain responsibilities that cause him stress. He reports adequate sleep with Seroquel. Estimates sleeping at least 9 hours a night. He reports some grogginess for about 30 minutes after awakening. He reports that appetite is ok and weight is stable. Energy and motivation are good. Denies difficulty with concentration. Denies SI.   Denies any new stressors.  Past Psychiatric Medication Trials: Lamictal- Rash. Had seizure when it was stopped.  Depakote-  Pancreatitis Lithium- Family commented that he "seemed like a zombieEducation officer, museum- Started about 6 weeks ago. Abilify- cannot recall response. Minimal improvement. Seroquel XR-Affective dulling, excessive somnolence. Took up to 600 mg QHS Seroquel Saphris- SI, uncontrolled crying Latuda Trazodone- Increased BP and suicidal thoughts Propranolol- Raised BP Clonidine- increased panic, crying, and SI Sertraline- Worsening mood symptoms and anxiety Lexapro Wellbutrin Duloxetine-excessively talkative. "Saying things out loud that I thought I was just thinking."  Buspar-Increased anxiety and increased BP.  Klonopin Diazepam- Paradoxical effect Hydroxyzine  Review of Systems:  Review of Systems  Musculoskeletal:  Negative for gait problem.  Neurological:  Negative for tremors.  Psychiatric/Behavioral:         Please refer to HPI    Medications: I have reviewed the patient's current medications.  Current Outpatient Medications  Medication Sig Dispense Refill   albuterol (VENTOLIN HFA) 108 (90 Base) MCG/ACT inhaler Inhale 2 puffs into the lungs every 6 (six) hours as needed.     amLODipine (NORVASC) 5 MG tablet Take 1 tablet (5 mg total) by mouth daily. 30 tablet 0   Evolocumab (REPATHA SURECLICK) 140 MG/ML SOAJ Inject 140 mg into the skin every 14 (fourteen) days. 6 mL 3   fluticasone (FLONASE) 50 MCG/ACT nasal spray Place 1 spray into both nostrils daily as needed.     gabapentin (NEURONTIN) 300 MG capsule Take 1 capsule (300 mg total) by mouth 3 (three) times daily. 270 capsule 1   hydrOXYzine (ATARAX) 25 MG tablet Take 1 tablet (25 mg total) by mouth 3 (three) times daily as needed for anxiety. 90 tablet 1   L-Methylfolate-Algae (DEPLIN 15) 15-90.314 MG  CAPS Take 15 mg by mouth daily. 90 capsule 3   lisinopril (ZESTRIL) 40 MG tablet Take 1 tablet (40 mg total) by mouth daily. 30 tablet 0   methocarbamol (ROBAXIN) 500 MG tablet 1 tablet Orally every 6 hrs as needed for back pain for 30  days     omega-3 acid ethyl esters (LOVAZA) 1 g capsule Take 1 capsule (1 g total) by mouth daily. 30 capsule 0   QUEtiapine (SEROQUEL XR) 300 MG 24 hr tablet Take 2 tablets (600 mg total) by mouth at bedtime. 180 tablet 0   testosterone cypionate (DEPOTESTOSTERONE CYPIONATE) 200 MG/ML injection Inject 0.5 mLs (100 mg total) into the muscle once a week. (Patient taking differently: Inject 200 mg into the muscle once a week.) 2.5 mL 3   Vitamin D, Ergocalciferol, 50000 units CAPS TAKE ONE CAPSULE BY MOUTH EVERY 7 DAYS 12 capsule 0   No current facility-administered medications for this visit.    Medication Side Effects: Other: Some grogginess initially upon awakening  Allergies:  Allergies  Allergen Reactions   Cephalexin Hives   Depakote [Divalproex Sodium]     Caused Pancreatitis   Trazodone And Nefazodone     Suicidal thoughts   Lamictal [Lamotrigine] Rash    Had a seizure when he stopped it   Lipitor [Atorvastatin] Rash    Other reaction(s): Other (See Comments) Memory issues that have not resolved    Past Medical History:  Diagnosis Date   Allergy    Anemia    Asthma    Bipolar II disorder (HCC)    Depression    Gallstones    GERD (gastroesophageal reflux disease)    Hx of adenomatous colonic polyps    Hyperlipidemia    Hypertension    Pancreatitis    Seizures (HCC)    past hx 6 yrs ago x 1- coming off meds caused 1 seizure     Family History  Problem Relation Age of Onset   Atrial fibrillation Mother    Anxiety disorder Mother    Obesity Mother    Mood Disorder Mother    Anxiety disorder Father    Prostate cancer Father    Brain cancer Father    Anxiety disorder Brother    Anxiety disorder Daughter    Colon cancer Neg Hx    Colon polyps Neg Hx    Esophageal cancer Neg Hx    Rectal cancer Neg Hx    Stomach cancer Neg Hx     Social History   Socioeconomic History   Marital status: Married    Spouse name: Not on file   Number of children: 1   Years  of education: Not on file   Highest education level: Not on file  Occupational History   Occupation: retired Runner, broadcasting/film/video  Tobacco Use   Smoking status: Never   Smokeless tobacco: Never  Substance and Sexual Activity   Alcohol use: Never   Drug use: Not Currently   Sexual activity: Not on file  Other Topics Concern   Not on file  Social History Narrative   Not on file   Social Determinants of Health   Financial Resource Strain: Low Risk  (06/14/2021)   Overall Financial Resource Strain (CARDIA)    Difficulty of Paying Living Expenses: Not hard at all  Food Insecurity: No Food Insecurity (07/07/2022)   Hunger Vital Sign    Worried About Running Out of Food in the Last Year: Never true    Ran Out of Food in  the Last Year: Never true  Transportation Needs: No Transportation Needs (07/07/2022)   PRAPARE - Administrator, Civil Service (Medical): No    Lack of Transportation (Non-Medical): No  Physical Activity: Insufficiently Active (06/14/2021)   Exercise Vital Sign    Days of Exercise per Week: 3 days    Minutes of Exercise per Session: 40 min  Stress: Stress Concern Present (06/14/2021)   [employer] of Occupational Health - Occupational Stress Questionnaire    Feeling of Stress : Very much  Social Connections: Unknown (01/14/2022)   Received from [facility]   Social Network    Social Network: Not on file  Intimate Partner Violence: Not At Risk (07/07/2022)   Humiliation, Afraid, Rape, and Kick questionnaire    Fear of Current or Ex-Partner: No    Emotionally Abused: No    Physically Abused: No    Sexually Abused: No    Past Medical History, Surgical history, Social history, and Family history were reviewed and updated as appropriate.   Please see review of systems for further details on the patient's review from today.   Objective:   Physical Exam:  There were no vitals taken for this visit.  Physical Exam Neurological:     Mental Status: He is  alert and oriented to person, place, and time.     Cranial Nerves: No dysarthria.  Psychiatric:        Attention and Perception: Attention and perception normal.        Mood and Affect: Mood normal.        Speech: Speech normal.        Behavior: Behavior is cooperative.        Thought Content: Thought content normal. Thought content is not paranoid or delusional. Thought content does not include homicidal or suicidal ideation. Thought content does not include homicidal or suicidal plan.        Cognition and Memory: Cognition and memory normal.        Judgment: Judgment normal.     Comments: Insight intact     Lab Review:     Component Value Date/Time   NA 143 11/25/2022 0854   K 4.4 11/25/2022 0854   CL 104 11/25/2022 0854   CO2 22 11/25/2022 0854   GLUCOSE 101 (H) 11/25/2022 0854   GLUCOSE 102 (H) 07/08/2022 0646   BUN 16 11/25/2022 0854   CREATININE 1.01 11/25/2022 0854   CREATININE 0.96 05/14/2020 0806   CALCIUM 9.4 11/25/2022 0854   PROT 6.9 12/01/2022 0731   ALBUMIN 4.7 12/01/2022 0731   AST 17 12/01/2022 0731   ALT 22 12/01/2022 0731   ALKPHOS 70 12/01/2022 0731   BILITOT 0.3 12/01/2022 0731   GFRNONAA >60 07/08/2022 0646   GFRNONAA 87 05/14/2020 0806   GFRAA 101 05/14/2020 0806       Component Value Date/Time   WBC 9.4 07/08/2022 0646   RBC 5.04 07/08/2022 0646   HGB 15.7 07/08/2022 0646   HCT 46.4 07/08/2022 0646   PLT 232 07/08/2022 0646   MCV 92.1 07/08/2022 0646   MCH 31.2 07/08/2022 0646   MCHC 33.8 07/08/2022 0646   RDW 11.9 07/08/2022 0646   LYMPHSABS 1.9 08/14/2021 1415   MONOABS 0.5 08/14/2021 1415   EOSABS 0.1 08/14/2021 1415   BASOSABS 0.1 08/14/2021 1415    No results found for: "POCLITH", "LITHIUM"   Lab Results  Component Value Date   VALPROATE 33.1 (L) 07/15/2019     .res Assessment: Plan:  Will continue current plan of care since target signs and symptoms are well controlled without any tolerability issues. Continue Seroquel  XR 600 mg at bedtime for mood stabilization.  Will continue hydroxyzine 25 mg TID prn anxiety.  Will continue Gabapentin 300 mg three times daily for anxiety.  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  Pt to follow-up with this provider in 4 weeks or sooner if clinically indicated.  Patient advised to contact office with any questions, adverse effects, or acute worsening in signs and symptoms.  Chase "Jesusita Oka" was seen today for follow-up.  Diagnoses and all orders for this visit:  Bipolar disorder in partial remission, most recent episode unspecified type (HCC)  Generalized anxiety disorder  Panic     Please see After Visit Summary for patient specific instructions.  No future appointments.   No orders of the defined types were placed in this encounter.     -------------------------------

## 2023-04-22 ENCOUNTER — Other Ambulatory Visit: Payer: Self-pay

## 2023-04-22 DIAGNOSIS — E559 Vitamin D deficiency, unspecified: Secondary | ICD-10-CM

## 2023-04-22 MED ORDER — VITAMIN D (ERGOCALCIFEROL) 50000 UNITS PO CAPS: ORAL_CAPSULE | ORAL | 0 refills | Status: DC

## 2023-05-11 DIAGNOSIS — L814 Other melanin hyperpigmentation: Secondary | ICD-10-CM | POA: Diagnosis not present

## 2023-05-11 DIAGNOSIS — Z85828 Personal history of other malignant neoplasm of skin: Secondary | ICD-10-CM | POA: Diagnosis not present

## 2023-05-11 DIAGNOSIS — D225 Melanocytic nevi of trunk: Secondary | ICD-10-CM | POA: Diagnosis not present

## 2023-05-11 DIAGNOSIS — L821 Other seborrheic keratosis: Secondary | ICD-10-CM | POA: Diagnosis not present

## 2023-05-11 DIAGNOSIS — Z08 Encounter for follow-up examination after completed treatment for malignant neoplasm: Secondary | ICD-10-CM | POA: Diagnosis not present

## 2023-05-11 DIAGNOSIS — L2089 Other atopic dermatitis: Secondary | ICD-10-CM | POA: Diagnosis not present

## 2023-05-19 ENCOUNTER — Telehealth (INDEPENDENT_AMBULATORY_CARE_PROVIDER_SITE_OTHER): Payer: Medicare PPO | Admitting: Psychiatry

## 2023-05-19 ENCOUNTER — Encounter: Payer: Self-pay | Admitting: Psychiatry

## 2023-05-19 DIAGNOSIS — F411 Generalized anxiety disorder: Secondary | ICD-10-CM

## 2023-05-19 DIAGNOSIS — F410 Panic disorder [episodic paroxysmal anxiety] without agoraphobia: Secondary | ICD-10-CM

## 2023-05-19 DIAGNOSIS — F3170 Bipolar disorder, currently in remission, most recent episode unspecified: Secondary | ICD-10-CM | POA: Diagnosis not present

## 2023-05-19 MED ORDER — QUETIAPINE FUMARATE 100 MG PO TABS
ORAL_TABLET | ORAL | 0 refills | Status: DC
Start: 2023-05-19 — End: 2023-08-10

## 2023-05-19 MED ORDER — QUETIAPINE FUMARATE ER 400 MG PO TB24
400.0000 mg | ORAL_TABLET | Freq: Every evening | ORAL | 0 refills | Status: DC
Start: 2023-05-19 — End: 2023-08-10

## 2023-05-19 NOTE — Progress Notes (Signed)
Chase Hunter [id number] Jul 30, 1961 62 y.o.  Virtual Visit via Video Note  I connected with pt @ on 05/19/23 at 10:00 AM EDT by a video enabled telemedicine application and verified that I am speaking with the correct person using two identifiers.   I discussed the limitations of evaluation and management by telemedicine and the availability of in person appointments. The patient expressed understanding and agreed to proceed.  I discussed the assessment and treatment plan with the patient. The patient was provided an opportunity to ask questions and all were answered. The patient agreed with the plan and demonstrated an understanding of the instructions.   The patient was advised to call back or seek an in-person evaluation if the symptoms worsen or if the condition fails to improve as anticipated.  I provided 17 minutes of non-face-to-face time during this encounter.  The patient was located at home.  The provider was located at [facility].   Corie Chiquito, PMHNP   Subjective:   Patient ID:  Chase Hunter. is a 62 y.o. (DOB 05/02/1961) male.  Chief Complaint:  Chief Complaint  Patient presents with   Anxiety   Follow-up    Bipolar Disorder    HPI [name]. presents for follow-up of anxiety and Bipolar Disorder.  He reports that he is, "About the same." Denies any change. "Feel good." Denies depressed mood. He denies any extreme highs or lows in mood.  He reports occasional intrusive thoughts and that he is able to dismiss these thoughts quickly. He reports noticing some anxiety when he first wakes up. He reports "extreme nervous" feeling upon awakening. He reports that his anxiety improves once he engages in another activity. Denies any panic attacks since last visit. He reports that he is sleeping well and getting an adequate amount of sleep. Appetite has been ok. Energy and motivation are ok. Concentration has been ok. Denies SI.   He  and his wife continue to care for grandson every week day.  Past Psychiatric Medication Trials: Lamictal- Rash. Had seizure when it was stopped.  Depakote- Pancreatitis Lithium- Family commented that he "seemed like a zombieEducation officer, museum- Started about 6 weeks ago. Abilify- cannot recall response. Minimal improvement. Seroquel XR-Affective dulling, excessive somnolence. Took up to 600 mg QHS Seroquel Saphris- SI, uncontrolled crying Latuda Trazodone- Increased BP and suicidal thoughts Propranolol- Raised BP Clonidine- increased panic, crying, and SI Sertraline- Worsening mood symptoms and anxiety Lexapro Wellbutrin Duloxetine-excessively talkative. "Saying things out loud that I thought I was just thinking."  Buspar-Increased anxiety and increased BP.  Klonopin Diazepam- Paradoxical effect Hydroxyzine  Review of Systems:  Review of Systems  Musculoskeletal:  Negative for gait problem.  Neurological:  Negative for tremors.  Psychiatric/Behavioral:         Please refer to HPI    Medications: I have reviewed the patient's current medications.  Current Outpatient Medications  Medication Sig Dispense Refill   albuterol (VENTOLIN HFA) 108 (90 Base) MCG/ACT inhaler Inhale 2 puffs into the lungs every 6 (six) hours as needed.     amLODipine (NORVASC) 5 MG tablet Take 1 tablet (5 mg total) by mouth daily. 30 tablet 0   Evolocumab (REPATHA SURECLICK) 140 MG/ML SOAJ Inject 140 mg into the skin every 14 (fourteen) days. 6 mL 3   fluticasone (FLONASE) 50 MCG/ACT nasal spray Place 1 spray into both nostrils daily as needed.     hydrOXYzine (ATARAX) 25 MG tablet Take 1 tablet (25 mg total) by mouth  3 (three) times daily as needed for anxiety. 90 tablet 1   L-Methylfolate-Algae (DEPLIN 15) 15-90.314 MG CAPS Take 15 mg by mouth daily. 90 capsule 3   lisinopril (ZESTRIL) 40 MG tablet Take 1 tablet (40 mg total) by mouth daily. 30 tablet 0   omega-3 acid ethyl esters (LOVAZA) 1 g capsule Take 1  capsule (1 g total) by mouth daily. 30 capsule 0   QUEtiapine (SEROQUEL XR) 300 MG 24 hr tablet Take 2 tablets (600 mg total) by mouth at bedtime. 180 tablet 0   QUEtiapine (SEROQUEL XR) 400 MG 24 hr tablet Take 1 tablet (400 mg total) by mouth every evening. 90 tablet 0   QUEtiapine (SEROQUEL) 100 MG tablet Take 1/2-1 tablet twice daily 180 tablet 0   testosterone cypionate (DEPOTESTOSTERONE CYPIONATE) 200 MG/ML injection Inject 0.5 mLs (100 mg total) into the muscle once a week. (Patient taking differently: Inject 200 mg into the muscle once a week.) 2.5 mL 3   Vitamin D, Ergocalciferol, 50000 units CAPS TAKE ONE CAPSULE BY MOUTH EVERY 7 DAYS 12 capsule 0   gabapentin (NEURONTIN) 300 MG capsule Take 1 capsule (300 mg total) by mouth 3 (three) times daily. 270 capsule 1   No current facility-administered medications for this visit.    Medication Side Effects: None  Allergies:  Allergies  Allergen Reactions   Cephalexin Hives   Depakote [Divalproex Sodium]     Caused Pancreatitis   Trazodone And Nefazodone     Suicidal thoughts   Lamictal [Lamotrigine] Rash    Had a seizure when he stopped it   Lipitor [Atorvastatin] Rash    Other reaction(s): Other (See Comments) Memory issues that have not resolved    Past Medical History:  Diagnosis Date   Allergy    Anemia    Asthma    Bipolar II disorder (HCC)    Depression    Gallstones    GERD (gastroesophageal reflux disease)    Hx of adenomatous colonic polyps    Hyperlipidemia    Hypertension    Pancreatitis    Seizures (HCC)    past hx 6 yrs ago x 1- coming off meds caused 1 seizure     Family History  Problem Relation Age of Onset   Atrial fibrillation Mother    Anxiety disorder Mother    Obesity Mother    Mood Disorder Mother    Anxiety disorder Father    Prostate cancer Father    Brain cancer Father    Anxiety disorder Brother    Anxiety disorder Daughter    Colon cancer Neg Hx    Colon polyps Neg Hx     Esophageal cancer Neg Hx    Rectal cancer Neg Hx    Stomach cancer Neg Hx     Social History   Socioeconomic History   Marital status: Married    Spouse name: Not on file   Number of children: 1   Years of education: Not on file   Highest education level: Not on file  Occupational History   Occupation: retired Runner, broadcasting/film/video  Tobacco Use   Smoking status: Never   Smokeless tobacco: Never  Substance and Sexual Activity   Alcohol use: Never   Drug use: Not Currently   Sexual activity: Not on file  Other Topics Concern   Not on file  Social History Narrative   Not on file   Social Determinants of Health   Financial Resource Strain: Low Risk  (06/14/2021)   Overall Financial  Resource Strain (CARDIA)    Difficulty of Paying Living Expenses: Not hard at all  Food Insecurity: No Food Insecurity (07/07/2022)   Hunger Vital Sign    Worried About Running Out of Food in the Last Year: Never true    Ran Out of Food in the Last Year: Never true  Transportation Needs: No Transportation Needs (07/07/2022)   PRAPARE - Administrator, Civil Service (Medical): No    Lack of Transportation (Non-Medical): No  Physical Activity: Insufficiently Active (06/14/2021)   Exercise Vital Sign    Days of Exercise per Week: 3 days    Minutes of Exercise per Session: 40 min  Stress: Stress Concern Present (06/14/2021)   [employer] of Occupational Health - Occupational Stress Questionnaire    Feeling of Stress : Very much  Social Connections: Unknown (01/14/2022)   Received from [facility]   Social Network    Social Network: Not on file  Intimate Partner Violence: Not At Risk (07/07/2022)   Humiliation, Afraid, Rape, and Kick questionnaire    Fear of Current or Ex-Partner: No    Emotionally Abused: No    Physically Abused: No    Sexually Abused: No    Past Medical History, Surgical history, Social history, and Family history were reviewed and updated as appropriate.    Please see review of systems for further details on the patient's review from today.   Objective:   Physical Exam:  There were no vitals taken for this visit.  Physical Exam Vitals reviewed.  Neurological:     Mental Status: He is alert and oriented to person, place, and time.     Cranial Nerves: No dysarthria.  Psychiatric:        Attention and Perception: Attention and perception normal.        Mood and Affect: Mood is anxious. Mood is not depressed.        Speech: Speech normal.        Behavior: Behavior is cooperative.        Thought Content: Thought content normal. Thought content is not paranoid or delusional. Thought content does not include homicidal or suicidal ideation. Thought content does not include homicidal or suicidal plan.        Cognition and Memory: Cognition and memory normal.        Judgment: Judgment normal.     Comments: Insight intact     Lab Review:     Component Value Date/Time   NA 143 11/25/2022 0854   K 4.4 11/25/2022 0854   CL 104 11/25/2022 0854   CO2 22 11/25/2022 0854   GLUCOSE 101 (H) 11/25/2022 0854   GLUCOSE 102 (H) 07/08/2022 0646   BUN 16 11/25/2022 0854   CREATININE 1.01 11/25/2022 0854   CREATININE 0.96 05/14/2020 0806   CALCIUM 9.4 11/25/2022 0854   PROT 6.9 12/01/2022 0731   ALBUMIN 4.7 12/01/2022 0731   AST 17 12/01/2022 0731   ALT 22 12/01/2022 0731   ALKPHOS 70 12/01/2022 0731   BILITOT 0.3 12/01/2022 0731   GFRNONAA >60 07/08/2022 0646   GFRNONAA 87 05/14/2020 0806   GFRAA 101 05/14/2020 0806       Component Value Date/Time   WBC 9.4 07/08/2022 0646   RBC 5.04 07/08/2022 0646   HGB 15.7 07/08/2022 0646   HCT 46.4 07/08/2022 0646   PLT 232 07/08/2022 0646   MCV 92.1 07/08/2022 0646   MCH 31.2 07/08/2022 0646   MCHC 33.8 07/08/2022 0646  RDW 11.9 07/08/2022 0646   LYMPHSABS 1.9 08/14/2021 1415   MONOABS 0.5 08/14/2021 1415   EOSABS 0.1 08/14/2021 1415   BASOSABS 0.1 08/14/2021 1415    No results found  for: "POCLITH", "LITHIUM"   Lab Results  Component Value Date   VALPROATE 33.1 (L) 07/15/2019     .res Assessment: Plan:    Discussed trial of Seroquel to improve anxiety during the morning. Will start trial of Seroquel 100 mg 1/2-1 tablet twice daily.  Will decrease Seroquel XR to 400 mg daily every evening for mood stabilization.  Continue Gabapentin 300 mg three times daily for anxiety.  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  Continue Hydroxyzine 25 mg three times daily as needed for insomnia.  Pt to follow-up with this provider in 6 weeks or sooner if clinically indicated.  Patient advised to contact office with any questions, adverse effects, or acute worsening in signs and symptoms.   Markavious "Jesusita Oka" was seen today for anxiety and follow-up.  Diagnoses and all orders for this visit:  Bipolar disorder in partial remission, most recent episode unspecified type (HCC) -     QUEtiapine (SEROQUEL) 100 MG tablet; Take 1/2-1 tablet twice daily -     QUEtiapine (SEROQUEL XR) 400 MG 24 hr tablet; Take 1 tablet (400 mg total) by mouth every evening.  Generalized anxiety disorder  Panic     Please see After Visit Summary for patient specific instructions.  No future appointments.   No orders of the defined types were placed in this encounter.     -------------------------------

## 2023-05-25 DIAGNOSIS — M545 Low back pain, unspecified: Secondary | ICD-10-CM | POA: Diagnosis not present

## 2023-05-25 DIAGNOSIS — I10 Essential (primary) hypertension: Secondary | ICD-10-CM | POA: Diagnosis not present

## 2023-06-30 ENCOUNTER — Telehealth (INDEPENDENT_AMBULATORY_CARE_PROVIDER_SITE_OTHER): Payer: Medicare PPO | Admitting: Psychiatry

## 2023-06-30 ENCOUNTER — Encounter: Payer: Self-pay | Admitting: Psychiatry

## 2023-06-30 DIAGNOSIS — F3170 Bipolar disorder, currently in remission, most recent episode unspecified: Secondary | ICD-10-CM

## 2023-06-30 DIAGNOSIS — F410 Panic disorder [episodic paroxysmal anxiety] without agoraphobia: Secondary | ICD-10-CM

## 2023-06-30 DIAGNOSIS — F411 Generalized anxiety disorder: Secondary | ICD-10-CM | POA: Diagnosis not present

## 2023-06-30 NOTE — Progress Notes (Signed)
Chase Hunter [id number] [birth year] 62 y.o.  Virtual Visit via Video Note  I connected with pt @ on 06/30/23 at  9:00 AM EDT by a video enabled telemedicine application and verified that I am speaking with the correct person using two identifiers.   I discussed the limitations of evaluation and management by telemedicine and the availability of in person appointments. The patient expressed understanding and agreed to proceed.  I discussed the assessment and treatment plan with the patient. The patient was provided an opportunity to ask questions and all were answered. The patient agreed with the plan and demonstrated an understanding of the instructions.   The patient was advised to call back or seek an in-person evaluation if the symptoms worsen or if the condition fails to improve as anticipated.  I provided 22 minutes of non-face-to-face time during this encounter.  The patient was located in his personal vehicle in [place].  The provider was located at [facility].   Corie Chiquito, PMHNP   Subjective:   Patient ID:  Chase Hunter. is a 62 y.o. (DOB 1961/05/20) male.  Chief Complaint:  Chief Complaint  Patient presents with   Follow-up    Anxiety, Bipolar Disorder    HPI Chase Hunter. presents for follow-up of Bipolar disorder and anxiety. He reports that he is currently, "very stable." He reports "my range of emotions is wider during the day. " He reports taking Seroquel IR 3-4 times a week as needed when he experiences increased anxiety/agitation. He reports that it causes some sedation and that he is able to stay awake. Anxiety has been well controlled overall. He reports that his mood has been "ok, even with a lot going on." Denies depressed mood. Denies any manic symptoms. He reports some irritability at times. He reports adequate sleep at night. Energy and motivation have been ok overall. Denies any change in appetite. Concentration is  adequate. Denies SI.   He reports that he recently went to [facility] with his wife, family, and grandson to visit family since brother-in-law is having health issues. They stayed with sister-in-law. He reports that he did not experience increased anxiety similar to what he has experienced during travel in the past.   He and his wife continue to take care of grandson daily during the week.  Review of Systems:  Review of Systems  Musculoskeletal:  Negative for gait problem.  Neurological:  Negative for tremors.  Psychiatric/Behavioral:         Please refer to HPI    Medications: I have reviewed the patient's current medications.  Current Outpatient Medications  Medication Sig Dispense Refill   hydrOXYzine (ATARAX) 25 MG tablet Take 1 tablet (25 mg total) by mouth 3 (three) times daily as needed for anxiety. 90 tablet 1   QUEtiapine (SEROQUEL XR) 400 MG 24 hr tablet Take 1 tablet (400 mg total) by mouth every evening. 90 tablet 0   QUEtiapine (SEROQUEL) 100 MG tablet Take 1/2-1 tablet twice daily 180 tablet 0   albuterol (VENTOLIN HFA) 108 (90 Base) MCG/ACT inhaler Inhale 2 puffs into the lungs every 6 (six) hours as needed.     amLODipine (NORVASC) 5 MG tablet Take 1 tablet (5 mg total) by mouth daily. 30 tablet 0   Evolocumab (REPATHA SURECLICK) 140 MG/ML SOAJ Inject 140 mg into the skin every 14 (fourteen) days. 6 mL 3   fluticasone (FLONASE) 50 MCG/ACT nasal spray Place 1 spray into both nostrils daily as needed.  gabapentin (NEURONTIN) 300 MG capsule Take 1 capsule (300 mg total) by mouth 3 (three) times daily. 270 capsule 1   L-Methylfolate-Algae (DEPLIN 15) 15-90.314 MG CAPS Take 15 mg by mouth daily. 90 capsule 3   lisinopril (ZESTRIL) 40 MG tablet Take 1 tablet (40 mg total) by mouth daily. 30 tablet 0   omega-3 acid ethyl esters (LOVAZA) 1 g capsule Take 1 capsule (1 g total) by mouth daily. 30 capsule 0   QUEtiapine (SEROQUEL XR) 300 MG 24 hr tablet Take 2 tablets (600 mg total) by  mouth at bedtime. 180 tablet 0   testosterone cypionate (DEPOTESTOSTERONE CYPIONATE) 200 MG/ML injection Inject 0.5 mLs (100 mg total) into the muscle once a week. (Patient taking differently: Inject 200 mg into the muscle once a week.) 2.5 mL 3   Vitamin D, Ergocalciferol, 50000 units CAPS TAKE ONE CAPSULE BY MOUTH EVERY 7 DAYS 12 capsule 0   No current facility-administered medications for this visit.    Medication Side Effects: Other: Mild somnolence  Allergies:  Allergies  Allergen Reactions   Cephalexin Hives   Depakote [Divalproex Sodium]     Caused Pancreatitis   Trazodone And Nefazodone     Suicidal thoughts   Lamictal [Lamotrigine] Rash    Had a seizure when he stopped it   Lipitor [Atorvastatin] Rash    Other reaction(s): Other (See Comments) Memory issues that have not resolved    Past Medical History:  Diagnosis Date   Allergy    Anemia    Asthma    Bipolar II disorder (HCC)    Depression    Gallstones    GERD (gastroesophageal reflux disease)    Hx of adenomatous colonic polyps    Hyperlipidemia    Hypertension    Pancreatitis    Seizures (HCC)    past hx 6 yrs ago x 1- coming off meds caused 1 seizure     Family History  Problem Relation Age of Onset   Atrial fibrillation Mother    Anxiety disorder Mother    Obesity Mother    Mood Disorder Mother    Anxiety disorder Father    Prostate cancer Father    Brain cancer Father    Anxiety disorder Brother    Anxiety disorder Daughter    Colon cancer Neg Hx    Colon polyps Neg Hx    Esophageal cancer Neg Hx    Rectal cancer Neg Hx    Stomach cancer Neg Hx     Social History   Socioeconomic History   Marital status: Married    Spouse name: Not on file   Number of children: 1   Years of education: Not on file   Highest education level: Not on file  Occupational History   Occupation: retired Runner, broadcasting/film/video  Tobacco Use   Smoking status: Never   Smokeless tobacco: Never  Substance and Sexual Activity    Alcohol use: Never   Drug use: Not Currently   Sexual activity: Not on file  Other Topics Concern   Not on file  Social History Narrative   Not on file   Social Determinants of Health   Financial Resource Strain: Low Risk  (06/14/2021)   Overall Financial Resource Strain (CARDIA)    Difficulty of Paying Living Expenses: Not hard at all  Food Insecurity: No Food Insecurity (07/07/2022)   Hunger Vital Sign    Worried About Running Out of Food in the Last Year: Never true    Ran Out of Food  in the Last Year: Never true  Transportation Needs: No Transportation Needs (07/07/2022)   PRAPARE - Administrator, Civil Service (Medical): No    Lack of Transportation (Non-Medical): No  Physical Activity: Insufficiently Active (06/14/2021)   Exercise Vital Sign    Days of Exercise per Week: 3 days    Minutes of Exercise per Session: 40 min  Stress: Stress Concern Present (06/14/2021)   [employer] of Occupational Health - Occupational Stress Questionnaire    Feeling of Stress : Very much  Social Connections: Unknown (01/14/2022)   Received from [facility]   Social Network    Social Network: Not on file  Intimate Partner Violence: Not At Risk (07/07/2022)   Humiliation, Afraid, Rape, and Kick questionnaire    Fear of Current or Ex-Partner: No    Emotionally Abused: No    Physically Abused: No    Sexually Abused: No    Past Medical History, Surgical history, Social history, and Family history were reviewed and updated as appropriate.   Please see review of systems for further details on the patient's review from today.   Objective:   Physical Exam:  There were no vitals taken for this visit.  Physical Exam Neurological:     Mental Status: He is alert and oriented to person, place, and time.     Cranial Nerves: No dysarthria.  Psychiatric:        Attention and Perception: Attention and perception normal.        Mood and Affect: Mood normal.  Affect is blunt.        Speech: Speech normal.        Behavior: Behavior is cooperative.        Thought Content: Thought content normal. Thought content is not paranoid or delusional. Thought content does not include homicidal or suicidal ideation. Thought content does not include homicidal or suicidal plan.        Cognition and Memory: Cognition and memory normal.        Judgment: Judgment normal.     Comments: Insight intact     Lab Review:     Component Value Date/Time   NA 143 11/25/2022 0854   K 4.4 11/25/2022 0854   CL 104 11/25/2022 0854   CO2 22 11/25/2022 0854   GLUCOSE 101 (H) 11/25/2022 0854   GLUCOSE 102 (H) 07/08/2022 0646   BUN 16 11/25/2022 0854   CREATININE 1.01 11/25/2022 0854   CREATININE 0.96 05/14/2020 0806   CALCIUM 9.4 11/25/2022 0854   PROT 6.9 12/01/2022 0731   ALBUMIN 4.7 12/01/2022 0731   AST 17 12/01/2022 0731   ALT 22 12/01/2022 0731   ALKPHOS 70 12/01/2022 0731   BILITOT 0.3 12/01/2022 0731   GFRNONAA >60 07/08/2022 0646   GFRNONAA 87 05/14/2020 0806   GFRAA 101 05/14/2020 0806       Component Value Date/Time   WBC 9.4 07/08/2022 0646   RBC 5.04 07/08/2022 0646   HGB 15.7 07/08/2022 0646   HCT 46.4 07/08/2022 0646   PLT 232 07/08/2022 0646   MCV 92.1 07/08/2022 0646   MCH 31.2 07/08/2022 0646   MCHC 33.8 07/08/2022 0646   RDW 11.9 07/08/2022 0646   LYMPHSABS 1.9 08/14/2021 1415   MONOABS 0.5 08/14/2021 1415   EOSABS 0.1 08/14/2021 1415   BASOSABS 0.1 08/14/2021 1415    No results found for: "POCLITH", "LITHIUM"   Lab Results  Component Value Date   VALPROATE 33.1 (L) 07/15/2019     .  res Assessment: Plan:    25 minutes spent dedicated to the care of this patient on the date of this encounter to include pre-visit review of records, ordering of medication, post visit documentation, and face-to-face time with the patient discussing response to decrease in Seroquel XR and using Seroquel IR as needed. Pt reports that his mood  symptoms have been adequately controlled overall and that he has more range of affect with lower dose of Seroquel XR. He reports that immediate release Seroquel is effective when acute anxiety/irritability/agitation occurs. He reports that he would like to continue current medications without changes at this time.  Continue Seroquel XR 400 mg in the evening for Bipolar disorder.  Continue Seroquel 100 mg 1/2-1 tab as needed.  Continue Hydroxyzine as needed for anxiety.  Continue Deplin 15 mg daily for MTHFR mutation and depression.  Pt to follow-up with this provider in 6 weeks or sooner if clinically indicated.  Patient advised to contact office with any questions, adverse effects, or acute worsening in signs and symptoms.  Tod "Jesusita Oka" was seen today for follow-up.  Diagnoses and all orders for this visit:  Bipolar disorder in partial remission, most recent episode unspecified type (HCC)  Generalized anxiety disorder  Panic     Please see After Visit Summary for patient specific instructions.  No future appointments.  No orders of the defined types were placed in this encounter.     -------------------------------

## 2023-07-15 ENCOUNTER — Encounter: Payer: Self-pay | Admitting: Psychiatry

## 2023-07-17 ENCOUNTER — Encounter: Payer: Self-pay | Admitting: Cardiovascular Disease

## 2023-07-17 DIAGNOSIS — R931 Abnormal findings on diagnostic imaging of heart and coronary circulation: Secondary | ICD-10-CM

## 2023-07-17 DIAGNOSIS — I2510 Atherosclerotic heart disease of native coronary artery without angina pectoris: Secondary | ICD-10-CM

## 2023-07-17 DIAGNOSIS — E782 Mixed hyperlipidemia: Secondary | ICD-10-CM

## 2023-07-17 MED ORDER — REPATHA SURECLICK 140 MG/ML ~~LOC~~ SOAJ: 1.0000 mL | SUBCUTANEOUS | 3 refills | Status: DC

## 2023-07-23 ENCOUNTER — Other Ambulatory Visit: Payer: Self-pay

## 2023-07-23 DIAGNOSIS — E559 Vitamin D deficiency, unspecified: Secondary | ICD-10-CM

## 2023-07-23 MED ORDER — VITAMIN D (ERGOCALCIFEROL) 50000 UNITS PO CAPS: ORAL_CAPSULE | ORAL | 0 refills | Status: DC

## 2023-08-10 ENCOUNTER — Telehealth (INDEPENDENT_AMBULATORY_CARE_PROVIDER_SITE_OTHER): Payer: Medicare PPO | Admitting: Psychiatry

## 2023-08-10 ENCOUNTER — Encounter: Payer: Self-pay | Admitting: Psychiatry

## 2023-08-10 DIAGNOSIS — F410 Panic disorder [episodic paroxysmal anxiety] without agoraphobia: Secondary | ICD-10-CM

## 2023-08-10 DIAGNOSIS — F3170 Bipolar disorder, currently in remission, most recent episode unspecified: Secondary | ICD-10-CM | POA: Diagnosis not present

## 2023-08-10 DIAGNOSIS — F411 Generalized anxiety disorder: Secondary | ICD-10-CM

## 2023-08-10 MED ORDER — QUETIAPINE FUMARATE 100 MG PO TABS
ORAL_TABLET | ORAL | 1 refills | Status: AC
Start: 2023-08-10 — End: ?

## 2023-08-10 MED ORDER — QUETIAPINE FUMARATE ER 400 MG PO TB24: 400.0000 mg | ORAL_TABLET | Freq: Every evening | ORAL | 1 refills | Status: AC

## 2023-08-10 MED ORDER — HYDROXYZINE HCL 25 MG PO TABS
25.0000 mg | ORAL_TABLET | Freq: Three times a day (TID) | ORAL | 1 refills | Status: AC | PRN
Start: 2023-08-10 — End: ?

## 2023-08-10 MED ORDER — GABAPENTIN 300 MG PO CAPS
300.0000 mg | ORAL_CAPSULE | Freq: Three times a day (TID) | ORAL | 1 refills | Status: AC
Start: 2023-09-12 — End: 2023-11-25

## 2023-08-10 NOTE — Progress Notes (Unsigned)
Chase Hunter Chase Hunter [id number] Jun 06, 1961 62 y.o.  Virtual Visit via Video Note  I connected with pt @ on 08/10/23 at  8:30 AM EST by a video enabled telemedicine application and verified that I am speaking with the correct person using two identifiers.   I discussed the limitations of evaluation and management by telemedicine and the availability of in person appointments. The patient expressed understanding and agreed to proceed.  I discussed the assessment and treatment plan with the patient. The patient was provided an opportunity to ask questions and all were answered. The patient agreed with the plan and demonstrated an understanding of the instructions.   The patient was advised to call back or seek an in-person evaluation if the symptoms worsen or if the condition fails to improve as anticipated.  I provided 20 minutes of non-face-to-face time during this encounter.  The patient was located at home.  The provider was located at [facility].   Corie Chiquito, PMHNP   Subjective:   Patient ID:  Chase Hunter. is a 62 y.o. (DOB 1961/04/18) male.  Chief Complaint:  Chief Complaint  Patient presents with   Manic Behavior   Follow-up    Anxiety    HPI Chase Hunter. presents for follow-up of anxiety and Bipolar Disorder. He reports, "I'm feeling fine." He reports that he has been working on some music. He reports that he has been "borderline in trouble... a couple of ecstatic experiences." He reports that "ecstatic" moments are brief. He reports "a lot of different ideas and firing on all cylinders." He reports increased goal-directed activity with music. He reports adequate sleep with Seroquel. Denies impulsivity or risky behavior. He reports that his anxiety is "ok, I deal with it every days." He describes anxiety as feeling unsettled. Denies any panic attacks. Denies depressed mood. Denies change in appetite. He reports some hyper-focus. Denies SI-  "got too much music going on."   He will go to [facility] to see wife's brother who is in poor health.    Past Psychiatric Medication Trials: Lamictal- Rash. Had seizure when it was stopped.  Depakote- Pancreatitis Lithium- Family commented that he "seemed like a zombieEducation officer, museum- Started about 6 weeks ago. Abilify- cannot recall response. Minimal improvement. Seroquel XR-Affective dulling, excessive somnolence. Took up to 600 mg QHS Seroquel Saphris- SI, uncontrolled crying Latuda Trazodone- Increased BP and suicidal thoughts Propranolol- Raised BP Clonidine- increased panic, crying, and SI Sertraline- Worsening mood symptoms and anxiety Lexapro Wellbutrin Duloxetine-excessively talkative. "Saying things out loud that I thought I was just thinking."  Buspar-Increased anxiety and increased BP.  Klonopin Diazepam- Paradoxical effect Hydroxyzine  Review of Systems:  Review of Systems  Musculoskeletal:  Negative for gait problem.  Neurological:  Negative for tremors.  Psychiatric/Behavioral:         Please refer to HPI    Medications: I have reviewed the patient's current medications.  Current Outpatient Medications  Medication Sig Dispense Refill   albuterol (VENTOLIN HFA) 108 (90 Base) MCG/ACT inhaler Inhale 2 puffs into the lungs every 6 (six) hours as needed.     amLODipine (NORVASC) 5 MG tablet Take 1 tablet (5 mg total) by mouth daily. 30 tablet 0   Evolocumab (REPATHA SURECLICK) 140 MG/ML SOAJ Inject 140 mg into the skin every 14 (fourteen) days. 6 mL 3   fluticasone (FLONASE) 50 MCG/ACT nasal spray Place 1 spray into both nostrils daily as needed.     [START ON 09/12/2023] gabapentin (NEURONTIN) 300 MG  capsule Take 1 capsule (300 mg total) by mouth 3 (three) times daily. 270 capsule 1   hydrOXYzine (ATARAX) 25 MG tablet Take 1 tablet (25 mg total) by mouth 3 (three) times daily as needed for anxiety. 90 tablet 1   L-Methylfolate-Algae (DEPLIN 15) 15-90.314 MG CAPS Take 15 mg  by mouth daily. 90 capsule 3   lisinopril (ZESTRIL) 40 MG tablet Take 1 tablet (40 mg total) by mouth daily. 30 tablet 0   omega-3 acid ethyl esters (LOVAZA) 1 g capsule Take 1 capsule (1 g total) by mouth daily. 30 capsule 0   QUEtiapine (SEROQUEL XR) 400 MG 24 hr tablet Take 1 tablet (400 mg total) by mouth every evening. 90 tablet 1   QUEtiapine (SEROQUEL) 100 MG tablet Take 1/2-1 tablet twice daily 180 tablet 1   testosterone cypionate (DEPOTESTOSTERONE CYPIONATE) 200 MG/ML injection Inject 0.5 mLs (100 mg total) into the muscle once a week. (Patient taking differently: Inject 200 mg into the muscle once a week.) 2.5 mL 3   Vitamin D, Ergocalciferol, 50000 units CAPS TAKE ONE CAPSULE BY MOUTH EVERY 7 DAYS 12 capsule 0   No current facility-administered medications for this visit.    Medication Side Effects: None  Allergies:  Allergies  Allergen Reactions   Cephalexin Hives   Depakote [Divalproex Sodium]     Caused Pancreatitis   Trazodone And Nefazodone     Suicidal thoughts   Lamictal [Lamotrigine] Rash    Had a seizure when he stopped it   Lipitor [Atorvastatin] Rash    Other reaction(s): Other (See Comments) Memory issues that have not resolved    Past Medical History:  Diagnosis Date   Allergy    Anemia    Asthma    Bipolar II disorder (HCC)    Depression    Gallstones    GERD (gastroesophageal reflux disease)    Hx of adenomatous colonic polyps    Hyperlipidemia    Hypertension    Pancreatitis    Seizures (HCC)    past hx 6 yrs ago x 1- coming off meds caused 1 seizure     Family History  Problem Relation Age of Onset   Atrial fibrillation Mother    Anxiety disorder Mother    Obesity Mother    Mood Disorder Mother    Anxiety disorder Father    Prostate cancer Father    Brain cancer Father    Anxiety disorder Brother    Anxiety disorder Daughter    Colon cancer Neg Hx    Colon polyps Neg Hx    Esophageal cancer Neg Hx    Rectal cancer Neg Hx     Stomach cancer Neg Hx     Social History   Socioeconomic History   Marital status: Married    Spouse name: Not on file   Number of children: 1   Years of education: Not on file   Highest education level: Not on file  Occupational History   Occupation: retired Runner, broadcasting/film/video  Tobacco Use   Smoking status: Never   Smokeless tobacco: Never  Substance and Sexual Activity   Alcohol use: Never   Drug use: Not Currently   Sexual activity: Not on file  Other Topics Concern   Not on file  Social History Narrative   Not on file   Social Determinants of Health   Financial Resource Strain: Low Risk  (06/14/2021)   Overall Financial Resource Strain (CARDIA)    Difficulty of Paying Living Expenses: Not hard at  all  Food Insecurity: No Food Insecurity (07/07/2022)   Hunger Vital Sign    Worried About Running Out of Food in the Last Year: Never true    Ran Out of Food in the Last Year: Never true  Transportation Needs: No Transportation Needs (07/07/2022)   PRAPARE - Administrator, Civil Service (Medical): No    Lack of Transportation (Non-Medical): No  Physical Activity: Insufficiently Active (06/14/2021)   Exercise Vital Sign    Days of Exercise per Week: 3 days    Minutes of Exercise per Session: 40 min  Stress: Stress Concern Present (06/14/2021)   [employer] of Occupational Health - Occupational Stress Questionnaire    Feeling of Stress : Very much  Social Connections: Unknown (01/14/2022)   Received from [facility]   Social Network    Social Network: Not on file  Intimate Partner Violence: Not At Risk (07/07/2022)   Humiliation, Afraid, Rape, and Kick questionnaire    Fear of Current or Ex-Partner: No    Emotionally Abused: No    Physically Abused: No    Sexually Abused: No    Past Medical History, Surgical history, Social history, and Family history were reviewed and updated as appropriate.   Please see review of systems for further details  on the patient's review from today.   Objective:   Physical Exam:  There were no vitals taken for this visit.  Physical Exam Neurological:     Mental Status: He is alert and oriented to person, place, and time.     Cranial Nerves: No dysarthria.  Psychiatric:        Attention and Perception: Attention and perception normal.        Speech: Speech normal.        Behavior: Behavior is cooperative.        Thought Content: Thought content normal. Thought content is not paranoid or delusional. Thought content does not include homicidal or suicidal ideation. Thought content does not include homicidal or suicidal plan.        Cognition and Memory: Cognition and memory normal.        Judgment: Judgment normal.     Comments: Insight intact Mood is mildly elevated     Lab Review:     Component Value Date/Time   NA 143 11/25/2022 0854   K 4.4 11/25/2022 0854   CL 104 11/25/2022 0854   CO2 22 11/25/2022 0854   GLUCOSE 101 (H) 11/25/2022 0854   GLUCOSE 102 (H) 07/08/2022 0646   BUN 16 11/25/2022 0854   CREATININE 1.01 11/25/2022 0854   CREATININE 0.96 05/14/2020 0806   CALCIUM 9.4 11/25/2022 0854   PROT 6.9 12/01/2022 0731   ALBUMIN 4.7 12/01/2022 0731   AST 17 12/01/2022 0731   ALT 22 12/01/2022 0731   ALKPHOS 70 12/01/2022 0731   BILITOT 0.3 12/01/2022 0731   GFRNONAA >60 07/08/2022 0646   GFRNONAA 87 05/14/2020 0806   GFRAA 101 05/14/2020 0806       Component Value Date/Time   WBC 9.4 07/08/2022 0646   RBC 5.04 07/08/2022 0646   HGB 15.7 07/08/2022 0646   HCT 46.4 07/08/2022 0646   PLT 232 07/08/2022 0646   MCV 92.1 07/08/2022 0646   MCH 31.2 07/08/2022 0646   MCHC 33.8 07/08/2022 0646   RDW 11.9 07/08/2022 0646   LYMPHSABS 1.9 08/14/2021 1415   MONOABS 0.5 08/14/2021 1415   EOSABS 0.1 08/14/2021 1415   BASOSABS 0.1 08/14/2021 1415  No results found for: "POCLITH", "LITHIUM"   Lab Results  Component Value Date   VALPROATE 33.1 (L) 07/15/2019      .res Assessment: Plan:   24 minutes spent dedicated to the care of this patient on the date of this encounter to include pre-visit review of records, ordering of medication, post visit documentation, and face-to-face time with the patient discussing recent mild manic symptoms. Discussed using Seroquel IR 100 mg more often to help reduce manic symptoms. Continue Seroquel XR 400 mg every evening for Bipolar disorder.  Continue Hydroxyzine 25 mg TID prn anxiety.  Continue Gabapentin 300 mg TID for anxiety.  Patient advised to contact office with any questions, adverse effects, or acute worsening in signs and symptoms.   Chase "Jesusita Oka" was seen today for manic behavior and follow-up.  Diagnoses and all orders for this visit:  Generalized anxiety disorder -     hydrOXYzine (ATARAX) 25 MG tablet; Take 1 tablet (25 mg total) by mouth 3 (three) times daily as needed for anxiety. -     gabapentin (NEURONTIN) 300 MG capsule; Take 1 capsule (300 mg total) by mouth 3 (three) times daily.  Bipolar disorder in partial remission, most recent episode unspecified type (HCC) -     QUEtiapine (SEROQUEL) 100 MG tablet; Take 1/2-1 tablet twice daily -     QUEtiapine (SEROQUEL XR) 400 MG 24 hr tablet; Take 1 tablet (400 mg total) by mouth every evening.  Panic     Please see After Visit Summary for patient specific instructions.  No future appointments.  No orders of the defined types were placed in this encounter.     -------------------------------

## 2023-08-14 DIAGNOSIS — E291 Testicular hypofunction: Secondary | ICD-10-CM | POA: Diagnosis not present

## 2023-08-14 DIAGNOSIS — Z125 Encounter for screening for malignant neoplasm of prostate: Secondary | ICD-10-CM | POA: Diagnosis not present

## 2023-09-24 DIAGNOSIS — Z125 Encounter for screening for malignant neoplasm of prostate: Secondary | ICD-10-CM | POA: Diagnosis not present

## 2023-09-24 DIAGNOSIS — R3912 Poor urinary stream: Secondary | ICD-10-CM | POA: Diagnosis not present

## 2023-09-24 DIAGNOSIS — E291 Testicular hypofunction: Secondary | ICD-10-CM | POA: Diagnosis not present

## 2023-09-24 DIAGNOSIS — R3915 Urgency of urination: Secondary | ICD-10-CM | POA: Diagnosis not present

## 2023-10-01 DIAGNOSIS — Z85828 Personal history of other malignant neoplasm of skin: Secondary | ICD-10-CM | POA: Diagnosis not present

## 2023-10-01 DIAGNOSIS — L02222 Furuncle of back [any part, except buttock]: Secondary | ICD-10-CM | POA: Diagnosis not present

## 2023-10-01 DIAGNOSIS — L02223 Furuncle of chest wall: Secondary | ICD-10-CM | POA: Diagnosis not present

## 2023-10-01 DIAGNOSIS — Z08 Encounter for follow-up examination after completed treatment for malignant neoplasm: Secondary | ICD-10-CM | POA: Diagnosis not present

## 2023-10-05 DIAGNOSIS — F410 Panic disorder [episodic paroxysmal anxiety] without agoraphobia: Secondary | ICD-10-CM | POA: Diagnosis not present

## 2023-10-05 DIAGNOSIS — F3181 Bipolar II disorder: Secondary | ICD-10-CM | POA: Diagnosis not present

## 2023-10-16 ENCOUNTER — Other Ambulatory Visit: Payer: Self-pay | Admitting: Psychiatry

## 2023-10-16 DIAGNOSIS — E559 Vitamin D deficiency, unspecified: Secondary | ICD-10-CM

## 2023-10-16 NOTE — Telephone Encounter (Signed)
Please call pt to verify if he is going to follow [facility] or if he needs a new provider here.

## 2023-11-02 DIAGNOSIS — F4011 Social phobia, generalized: Secondary | ICD-10-CM | POA: Diagnosis not present

## 2023-11-02 DIAGNOSIS — F410 Panic disorder [episodic paroxysmal anxiety] without agoraphobia: Secondary | ICD-10-CM | POA: Diagnosis not present

## 2023-11-02 DIAGNOSIS — F310 Bipolar disorder, current episode hypomanic: Secondary | ICD-10-CM | POA: Diagnosis not present

## 2023-11-03 ENCOUNTER — Other Ambulatory Visit (HOSPITAL_COMMUNITY): Payer: Self-pay | Admitting: Family Medicine

## 2023-11-03 ENCOUNTER — Ambulatory Visit (HOSPITAL_COMMUNITY)
Admission: RE | Admit: 2023-11-03 | Discharge: 2023-11-03 | Disposition: A | Discharge status: Home / Self Care | Source: Ambulatory Visit | Attending: Family Medicine | Admitting: Family Medicine

## 2023-11-03 DIAGNOSIS — Z8719 Personal history of other diseases of the digestive system: Secondary | ICD-10-CM | POA: Insufficient documentation

## 2023-11-03 DIAGNOSIS — K859 Acute pancreatitis without necrosis or infection, unspecified: Secondary | ICD-10-CM | POA: Diagnosis not present

## 2023-11-03 DIAGNOSIS — N3289 Other specified disorders of bladder: Secondary | ICD-10-CM | POA: Diagnosis not present

## 2023-11-03 DIAGNOSIS — I10 Essential (primary) hypertension: Secondary | ICD-10-CM | POA: Diagnosis not present

## 2023-11-03 DIAGNOSIS — N400 Enlarged prostate without lower urinary tract symptoms: Secondary | ICD-10-CM | POA: Diagnosis not present

## 2023-11-03 DIAGNOSIS — R1013 Epigastric pain: Secondary | ICD-10-CM | POA: Insufficient documentation

## 2023-11-03 MED ORDER — IOHEXOL 300 MG/ML  SOLN
100.0000 mL | Freq: Once | INTRAMUSCULAR | Status: AC | PRN
  Administered 2023-11-03: 100 mL via INTRAVENOUS

## 2023-11-10 DIAGNOSIS — R1013 Epigastric pain: Secondary | ICD-10-CM | POA: Diagnosis not present

## 2023-11-11 LAB — LAB REPORT - SCANNED: EGFR: 97

## 2023-11-17 ENCOUNTER — Encounter: Payer: Self-pay | Admitting: Physician Assistant

## 2023-11-17 DIAGNOSIS — L814 Other melanin hyperpigmentation: Secondary | ICD-10-CM | POA: Diagnosis not present

## 2023-11-17 DIAGNOSIS — L821 Other seborrheic keratosis: Secondary | ICD-10-CM | POA: Diagnosis not present

## 2023-11-17 DIAGNOSIS — D492 Neoplasm of unspecified behavior of bone, soft tissue, and skin: Secondary | ICD-10-CM | POA: Diagnosis not present

## 2023-11-17 DIAGNOSIS — L538 Other specified erythematous conditions: Secondary | ICD-10-CM | POA: Diagnosis not present

## 2023-11-17 DIAGNOSIS — L570 Actinic keratosis: Secondary | ICD-10-CM | POA: Diagnosis not present

## 2023-11-17 DIAGNOSIS — D225 Melanocytic nevi of trunk: Secondary | ICD-10-CM | POA: Diagnosis not present

## 2023-11-17 DIAGNOSIS — B079 Viral wart, unspecified: Secondary | ICD-10-CM | POA: Diagnosis not present

## 2023-11-23 ENCOUNTER — Ambulatory Visit: Admitting: Physician Assistant

## 2023-11-23 ENCOUNTER — Encounter: Payer: Self-pay | Admitting: Physician Assistant

## 2023-11-23 ENCOUNTER — Encounter: Payer: Self-pay | Admitting: Gastroenterology

## 2023-11-23 ENCOUNTER — Other Ambulatory Visit (INDEPENDENT_AMBULATORY_CARE_PROVIDER_SITE_OTHER)

## 2023-11-23 VITALS — BP 124/62 | HR 75 | Ht 69.0 in | Wt 184.0 lb

## 2023-11-23 DIAGNOSIS — R748 Abnormal levels of other serum enzymes: Secondary | ICD-10-CM

## 2023-11-23 DIAGNOSIS — Z8719 Personal history of other diseases of the digestive system: Secondary | ICD-10-CM

## 2023-11-23 DIAGNOSIS — R1013 Epigastric pain: Secondary | ICD-10-CM

## 2023-11-23 LAB — LIPASE: Lipase: 95 U/L — ABNORMAL HIGH (ref 11.0–59.0)

## 2023-11-23 NOTE — Progress Notes (Signed)
 Chief Complaint: Elevated lipase and abdominal pain  HPI:    Chase Hunter is a 63 year old male, known to Dr. Adela Lank, with a past medical history as listed below including bipolar 2 disorder, depression, GERD, pancreatitis and a few others listed below, who was referred to me by Tally Joe, MD for a complaint of evaded lipase and abdominal pain.    05/18/2020 office visit with Dr. Leone Payor to discuss epigastric pain.  Diagnosed with pancreatitis.  At that time a 7 mm gallbladder polyp versus stone was found.  IgG4 and ANA negative, triglycerides normal.  No alcohol use.  His Depakote had been increased prior to episode of pancreatitis.  That time was thought his pancreatitis was drug-induced from Depakote.  Recommended MRCP.  Also discussed abnormal gallbladder imaging.  Plans to get previous records from EGD and colonoscopy.    06/20/2020 MRCP with and without contrast with a 2.4 x 1.9 x 1.7 cm enhancing structure along the tip of the pancreatic tail which followed the spleen parenchymal and compatible with accessory spleen along the margin of the pancreatic tail tip.  Some trace stranding along the tip of the tail the pancreas.  Probably residual from prior pancreatitis.  No biliary dilation or choledocholithiasis.  No gallbladder polyp.    07/09/2020 colonoscopy done for personal history of adenoma 1 greater than 10 mm removed in 2015 with one 4 to 5 mm polyp in the cecum, one 3 mm polyp in the ascending colon, one 3 mm polyp in the transverse colon, diverticulosis in the transverse and left colon internal hemorrhoids.  Pathology showed adenomatous polyps and repeat recommended in 5 years.    07/19/2020 EUS with a 2.5 cm lesion that appeared to be directly abutting the pancreatic tail.  Honeycombed appearance to the pancreas.  CBD normal.  Pancreatic parenchymal and ductal changes consistent with chronic pancreatitis.    11/03/2023 CTAP with contrast with no radiographic evidence of  pancreatitis, mild to moderately enlarged prostate with diffuse bladder wall thickening thought due to chronic bladder outlet obstruction and bilateral L5 pars defect with spondylolisthesis.    11/04/2023 patient reported no change in symptoms, they recommended liquid diet for 24 hours, avoid alcohol, recheck lipase in a week and begin Nexium 20 mg before dinner.    Today, the patient presents to clinic and tells me that he actually went to see the [facility] after seeing [place] in 2021 and they repeated an MRI and told him that they saw no sign of pancreatitis at all and were 99% sure that this was all from his Depakote and that he should be fine going forward.  (I do not see these records in Care Everywhere).      Explains that he had been doing fine over the past few years until about a month ago when he started with a twinge of discomfort rated as a 1-2 in his right upper quadrant that happened occasionally, it then started to move across the upper part of his abdomen at different times and lasted for about 3 weeks before he went to see his PCP.  They did labs there which we do not have but he thinks he recalls his lipase was 147 initially and repeated about a week later and up to 276 which was a week and a half ago.      Tells me that he continues with pain and it apparently got worse after first seeing his PCP, but now is back down to 1-2 and sometimes  not there at all.  It does not seem related to food.  Did try a 2-week course of Nexium 20 mg daily and does not feel like it changed anything.  He has stopped unnecessary medicines which he tells me are Repatha and Testosterone for fear that these may have been causing his symptoms.  Tells me the morning seem to be worse as far as the discomfort and then throughout the day it seems to get better.  No other changes in supplements or diet.  Denies any alcohol use.    Denies fever, chills, weight loss, change in bowel habits, nausea or vomiting.  Past Medical  History:  Diagnosis Date   Allergy    Anemia    Asthma    Bipolar II disorder (HCC)    Depression    Gallstones    GERD (gastroesophageal reflux disease)    Hx of adenomatous colonic polyps    Hyperlipidemia    Hypertension    Pancreatitis    Seizures (HCC)    past hx 6 yrs ago x 1- coming off meds caused 1 seizure     Past Surgical History:  Procedure Laterality Date   COLONOSCOPY     ESOPHAGOGASTRODUODENOSCOPY N/A 07/19/2020   Procedure: ESOPHAGOGASTRODUODENOSCOPY (EGD);  Surgeon: Rachael Fee, MD;  Location: [facility];  Service: Endoscopy;  Laterality: N/A;   EUS N/A 07/19/2020   Procedure: UPPER ENDOSCOPIC ULTRASOUND (EUS) RADIAL;  Surgeon: Rachael Fee, MD;  Location: [facility];  Service: Endoscopy;  Laterality: N/A;   POLYPECTOMY      Current Outpatient Medications  Medication Sig Dispense Refill   albuterol (VENTOLIN HFA) 108 (90 Base) MCG/ACT inhaler Inhale 2 puffs into the lungs every 6 (six) hours as needed.     amLODipine (NORVASC) 5 MG tablet Take 1 tablet (5 mg total) by mouth daily. 30 tablet 0   Evolocumab (REPATHA SURECLICK) 140 MG/ML SOAJ Inject 140 mg into the skin every 14 (fourteen) days. 6 mL 3   fluticasone (FLONASE) 50 MCG/ACT nasal spray Place 1 spray into both nostrils daily as needed.     gabapentin (NEURONTIN) 300 MG capsule Take 1 capsule (300 mg total) by mouth 3 (three) times daily. 270 capsule 1   hydrOXYzine (ATARAX) 25 MG tablet Take 1 tablet (25 mg total) by mouth 3 (three) times daily as needed for anxiety. 90 tablet 1   L-Methylfolate-Algae (DEPLIN 15) 15-90.314 MG CAPS Take 15 mg by mouth daily. 90 capsule 3   lisinopril (ZESTRIL) 40 MG tablet Take 1 tablet (40 mg total) by mouth daily. 30 tablet 0   omega-3 acid ethyl esters (LOVAZA) 1 g capsule Take 1 capsule (1 g total) by mouth daily. 30 capsule 0   QUEtiapine (SEROQUEL XR) 400 MG 24 hr tablet Take 1 tablet (400 mg total) by mouth every evening. 90 tablet 1   QUEtiapine  (SEROQUEL) 100 MG tablet Take 1/2-1 tablet twice daily 180 tablet 1   testosterone cypionate (DEPOTESTOSTERONE CYPIONATE) 200 MG/ML injection Inject 0.5 mLs (100 mg total) into the muscle once a week. (Patient taking differently: Inject 200 mg into the muscle once a week.) 2.5 mL 3   Vitamin D, Ergocalciferol, (DRISDOL) 1.25 MG (50000 UNIT) CAPS capsule TAKE 1 CAPSULE BY MOUTH EVERY 7 DAYS 12 capsule 0   No current facility-administered medications for this visit.    Allergies as of 11/23/2023 - Review Complete 08/10/2023  Allergen Reaction Noted   Cephalexin Hives 02/01/2014   Depakote [divalproex sodium]  05/17/2021  Trazodone and nefazodone  05/17/2021   Lamictal [lamotrigine] Rash 05/17/2021   Lipitor [atorvastatin] Rash 04/19/2014    Family History  Problem Relation Age of Onset   Atrial fibrillation Mother    Anxiety disorder Mother    Obesity Mother    Mood Disorder Mother    Anxiety disorder Father    Prostate cancer Father    Brain cancer Father    Anxiety disorder Brother    Anxiety disorder Daughter    Colon cancer Neg Hx    Colon polyps Neg Hx    Esophageal cancer Neg Hx    Rectal cancer Neg Hx    Stomach cancer Neg Hx     Social History   Socioeconomic History   Marital status: Married    Spouse name: Not on file   Number of children: 1   Years of education: Not on file   Highest education level: Not on file  Occupational History   Occupation: retired Runner, broadcasting/film/video  Tobacco Use   Smoking status: Never   Smokeless tobacco: Never  Substance and Sexual Activity   Alcohol use: Never   Drug use: Not Currently   Sexual activity: Not on file  Other Topics Concern   Not on file  Social History Narrative   Not on file   Social Drivers of Health   Financial Resource Strain: Low Risk  (06/14/2021)   Overall Financial Resource Strain (CARDIA)    Difficulty of Paying Living Expenses: Not hard at all  Food Insecurity: No Food Insecurity (07/07/2022)   Hunger  Vital Sign    Worried About Running Out of Food in the Last Year: Never true    Ran Out of Food in the Last Year: Never true  Transportation Needs: No Transportation Needs (07/07/2022)   PRAPARE - Administrator, Civil Service (Medical): No    Lack of Transportation (Non-Medical): No  Physical Activity: Insufficiently Active (06/14/2021)   Exercise Vital Sign    Days of Exercise per Week: 3 days    Minutes of Exercise per Session: 40 min  Stress: Stress Concern Present (06/14/2021)   [employer] of Occupational Health - Occupational Stress Questionnaire    Feeling of Stress : Very much  Social Connections: Unknown (01/14/2022)   [facility] from [facility]   Social Network    Social Network: Not on file  Intimate Partner Violence: Not At Risk (07/07/2022)   Humiliation, Afraid, Rape, and Kick questionnaire    Fear of Current or Ex-Partner: No    Emotionally Abused: No    Physically Abused: No    Sexually Abused: No    Review of Systems:    Constitutional: No weight loss, fever or chills Skin: No rash Cardiovascular: No chest pain  Respiratory: No SOB  Gastrointestinal: See HPI and otherwise negative Genitourinary: No dysuria Neurological: No headache, dizziness or syncope Musculoskeletal: No new muscle or joint pain Hematologic: No bleeding Psychiatric: No history of depression or anxiety   Physical Exam:  Vital signs: BP 124/62   Pulse 75   Ht 5\' 9"  (1.753 m)   Wt 184 lb (83.5 kg)   BMI 27.17 kg/m    Constitutional:   Pleasant Caucasian male appears to be in NAD, Well developed, Well nourished, alert and cooperative Head:  Normocephalic and atraumatic. Eyes:   PEERL, EOMI. No icterus. Conjunctiva pink. Ears:  Normal auditory acuity. Neck:  Supple Throat: Oral cavity and pharynx without inflammation, swelling or lesion.  Respiratory: Respirations even and  unlabored. Lungs clear to auscultation bilaterally.   No wheezes, crackles, or  rhonchi.  Cardiovascular: Normal S1, S2. No MRG. Regular rate and rhythm. No peripheral edema, cyanosis or pallor.  Gastrointestinal:  Soft, nondistended, nontender. No rebound or guarding. Normal bowel sounds. No appreciable masses or hepatomegaly. Rectal:  Not performed.  Msk:  Symmetrical without gross deformities. Without edema, no deformity or joint abnormality.  Neurologic:  Alert and  oriented x4;  grossly normal neurologically.  Skin:   Dry and intact without significant lesions or rashes. Psychiatric: Demonstrates good judgement and reason without abnormal affect or behaviors.  RELEVANT LABS AND IMAGING: CBC    Component Value Date/Time   WBC 9.4 07/08/2022 0646   RBC 5.04 07/08/2022 0646   HGB 15.7 07/08/2022 0646   HCT 46.4 07/08/2022 0646   PLT 232 07/08/2022 0646   MCV 92.1 07/08/2022 0646   MCH 31.2 07/08/2022 0646   MCHC 33.8 07/08/2022 0646   RDW 11.9 07/08/2022 0646   LYMPHSABS 1.9 08/14/2021 1415   MONOABS 0.5 08/14/2021 1415   EOSABS 0.1 08/14/2021 1415   BASOSABS 0.1 08/14/2021 1415    CMP     Component Value Date/Time   NA 143 11/25/2022 0854   K 4.4 11/25/2022 0854   CL 104 11/25/2022 0854   CO2 22 11/25/2022 0854   GLUCOSE 101 (H) 11/25/2022 0854   GLUCOSE 102 (H) 07/08/2022 0646   BUN 16 11/25/2022 0854   CREATININE 1.01 11/25/2022 0854   CREATININE 0.96 05/14/2020 0806   CALCIUM 9.4 11/25/2022 0854   PROT 6.9 12/01/2022 0731   ALBUMIN 4.7 12/01/2022 0731   AST 17 12/01/2022 0731   ALT 22 12/01/2022 0731   ALKPHOS 70 12/01/2022 0731   BILITOT 0.3 12/01/2022 0731   GFRNONAA >60 07/08/2022 0646   GFRNONAA 87 05/14/2020 0806   GFRAA 101 05/14/2020 0806    Assessment: 1.  Elevated lipase: X 2 recently over the past month, last draw a week and a half ago per patient was 276, symptoms have lessened since then per him, will recheck today, consider relation to below 2.  Right upper quadrant/epigastric abdominal pain: Consider relation to gastritis  +/- duodenitis versus history of pancreatitis, though recent CT did not show any signs of this, lipase is elevated 3.  History of pancreatitis: Diagnosed back in 2021, at the time thought from Depakote, though EUS showed possible signs of chronic pancreatitis, patient then followed the [facility] and was told he did not have chronic pancreatitis, does have a Splenule under that area question if this is significant and his ongoing symptoms  Plan: 1.  Requesting recent labs from PCP 2.  Scheduled patient for diagnostic EGD in the [facility] with Dr. Adela Lank.  Did provide the patient a detailed list of risks for the procedure and he agrees to proceed. Patient is appropriate for endoscopic procedure(s) in the ambulatory ([facility]) setting.  3.  Reviewed diet for possible chronic pancreatitis including low-fat foods and no alcohol 4.  Will recheck a lipase today 5.  Pending outcome from EGD could consider repeating MRI/MRCP. 6.  Did review recent CT of the abdomen which shows no sign of pancreatitis. 7.  Patient to follow in clinic per recommendations after time of procedure with Dr. Adela Lank.  Hyacinth Meeker, PA-C [facility] 11/23/2023, 8:39 AM  Cc: Tally Joe, MD   Addendum: 11/26/2023 2:43 PM  Received recent labs.  11/10/2023 lipase 276, 11/03/2023 lipase 137  11/03/2023 CBC normal and CMP normal.  No change  in plan.  Hyacinth Meeker, PA-C

## 2023-11-23 NOTE — Progress Notes (Signed)
 Agree with assessment and plan as outlined.  We will await his EGD.  May be reasonable to do MRCP to reassess his pancreas given lipase elevation.  CT scan is reassuring however.

## 2023-11-23 NOTE — Patient Instructions (Addendum)
 You have been scheduled for an endoscopy. Please follow written instructions given to you at your visit today.  If you use inhalers (even only as needed), please bring them with you on the day of your procedure.  If you take any of the following medications, they will need to be adjusted prior to your procedure:   DO NOT TAKE 7 DAYS PRIOR TO TEST- Trulicity (dulaglutide) Ozempic, Wegovy (semaglutide) Mounjaro (tirzepatide) Bydureon Bcise (exanatide extended release)  DO NOT TAKE 1 DAY PRIOR TO YOUR TEST Rybelsus (semaglutide) Adlyxin (lixisenatide) Victoza (liraglutide) Byetta (exanatide) ___________________________________________________________________________    Your provider has requested that you go to the basement level for lab work before leaving today. Press "B" on the elevator. The lab is located at the first door on the left as you exit the elevator.   Due to recent changes in healthcare laws, you may see the results of your imaging and laboratory studies on MyChart before your provider has had a chance to review them.  We understand that in some cases there may be results that are confusing or concerning to you. Not all laboratory results come back in the same time frame and the provider may be waiting for multiple results in order to interpret others.  Please give [place] 48 hours in order for your provider to thoroughly review all the results before contacting the office for clarification of your results.    I appreciate the  opportunity to care for you  Thank You   Ezequiel Kayser

## 2023-11-25 ENCOUNTER — Ambulatory Visit: Admitting: Gastroenterology

## 2023-11-25 ENCOUNTER — Encounter: Payer: Self-pay | Admitting: Gastroenterology

## 2023-11-25 VITALS — BP 103/68 | HR 80 | Temp 98.2°F | Resp 12 | Ht 69.0 in | Wt 184.0 lb

## 2023-11-25 DIAGNOSIS — K449 Diaphragmatic hernia without obstruction or gangrene: Secondary | ICD-10-CM | POA: Diagnosis not present

## 2023-11-25 DIAGNOSIS — K2970 Gastritis, unspecified, without bleeding: Secondary | ICD-10-CM | POA: Diagnosis not present

## 2023-11-25 DIAGNOSIS — R1013 Epigastric pain: Secondary | ICD-10-CM | POA: Diagnosis not present

## 2023-11-25 MED ORDER — SODIUM CHLORIDE 0.9 % IV SOLN: 500.0000 mL | Freq: Once | INTRAVENOUS | Status: DC

## 2023-11-25 NOTE — Patient Instructions (Signed)
 AVOID ASPIRIN, ASPIRIN CONTAINING PRODUCTS (BC OR GOODY POWDERS) OR NSAIDS (IBUPROFEN, ADVIL, ALEVE, AND MOTRIN); TYLENOL IS OK TO TAKE Increase Nexium to twice a day for now   YOU HAD AN ENDOSCOPIC PROCEDURE TODAY AT THE [facility]:   Refer to the procedure report that was given to you for any specific questions about what was found during the examination.  If the procedure report does not answer your questions, please call your gastroenterologist to clarify.  If you requested that your care partner not be given the details of your procedure findings, then the procedure report has been included in a sealed envelope for you to review at your convenience later.  YOU SHOULD EXPECT: Some feelings of bloating in the abdomen. Passage of more gas than usual.  Walking can help get rid of the air that was put into your GI tract during the procedure and reduce the bloating. If you had a lower endoscopy (such as a colonoscopy or flexible sigmoidoscopy) you may notice spotting of blood in your stool or on the toilet paper. If you underwent a bowel prep for your procedure, you may not have a normal bowel movement for a few days.  Please Note:  You might notice some irritation and congestion in your nose or some drainage.  This is from the oxygen used during your procedure.  There is no need for concern and it should clear up in a day or so.  SYMPTOMS TO REPORT IMMEDIATELY:  Following upper endoscopy (EGD)  Vomiting of blood or coffee ground material  New chest pain or pain under the shoulder blades  Painful or persistently difficult swallowing  New shortness of breath  Fever of 100F or higher  Black, tarry-looking stools  For urgent or emergent issues, a gastroenterologist can be reached at any hour by calling [phone]. Do not use MyChart messaging for urgent concerns.    DIET:  We do recommend a small meal at first, but then you may proceed to your regular diet.  Drink plenty of  fluids but you should avoid alcoholic beverages for 24 hours.  ACTIVITY:  You should plan to take it easy for the rest of today and you should NOT DRIVE or use heavy machinery until tomorrow (because of the sedation medicines used during the test).    FOLLOW UP: Our staff will call the number listed on your records the next business day following your procedure.  We will call around 7:15- 8:00 am to check on you and address any questions or concerns that you may have regarding the information given to you following your procedure. If we do not reach you, we will leave a message.     If any biopsies were taken you will be contacted by phone or by letter within the next 1-3 weeks.  Please call us at [phone] if you have not heard about the biopsies in 3 weeks.    SIGNATURES/CONFIDENTIALITY: You and/or your care partner have signed paperwork which will be entered into your electronic medical record.  These signatures attest to the fact that that the information above on your After Visit Summary has been reviewed and is understood.  Full responsibility of the confidentiality of this discharge information lies with you and/or your care-partner.

## 2023-11-25 NOTE — Progress Notes (Signed)
 Called to room to assist during endoscopic procedure.  Patient ID and intended procedure confirmed with present staff. Received instructions for my participation in the procedure from the performing physician.

## 2023-11-25 NOTE — Progress Notes (Signed)
 History and Physical Interval Note: Patient seen 11/23/23 - no interval changes. History of epigastric pain. EGD to evaluate. Placed recently on nexium. CT scan 11/03/23 without clear cause. Lipase elevated with history of pancreatitis. Otherwise feels well today without complaints.    11/25/2023 8:28 AM  Chase Hunter.  has presented today for endoscopic procedure(s), with the diagnosis of  Encounter Diagnosis  Name Primary?   Abdominal pain, epigastric Yes  .  The various methods of evaluation and treatment have been discussed with the patient and/or family. After consideration of risks, benefits and other options for treatment, the patient has consented to  the endoscopic procedure(s).   The patient's history has been reviewed, patient examined, no change in status, stable for surgery.  I have reviewed the patient's chart and labs.  Questions were answered to the patient's satisfaction.    Harlin Rain, MD [facility]

## 2023-11-25 NOTE — Progress Notes (Signed)
 Pt sedate, gd SR's, VSS, report to RN

## 2023-11-25 NOTE — Addendum Note (Signed)
 Addended by: Georgina Peer on: 11/25/2023 04:00 PM   Modules accepted: Orders

## 2023-11-25 NOTE — Progress Notes (Signed)
 Pt's states no medical or surgical changes since previsit or office visit.

## 2023-11-25 NOTE — Op Note (Signed)
 [facility] Patient Name: Chase Hunter Procedure Date: 11/25/2023 8:23 AM MRN: [id number] Endoscopist: Viviann Spare P. Adela Lank , MD, [id number] Age: 63 Referring MD:  Date of Birth: 1960/12/04 Gender: Male Account #: [id number] Procedure:                Upper GI endoscopy Indications:              Epigastric abdominal pain - recently placed on                            nexium, symptoms persist. Elevated lipase but CT                            11/03/23 showed a normal pancreas. Prior history of                            pancreatitis. First time EGD. Medicines:                Monitored Anesthesia Care Procedure:                Pre-Anesthesia Assessment:                           - Prior to the procedure, a History and Physical                            was performed, and patient medications and                            allergies were reviewed. The patient's tolerance of                            previous anesthesia was also reviewed. The risks                            and benefits of the procedure and the sedation                            options and risks were discussed with the patient.                            All questions were answered, and informed consent                            was obtained. Prior Anticoagulants: The patient has                            taken no anticoagulant or antiplatelet agents. ASA                            Grade Assessment: II - A patient with mild systemic                            disease. After reviewing the risks and benefits,  the patient was deemed in satisfactory condition to                            undergo the procedure.                           After obtaining informed consent, the endoscope was                            passed under direct vision. Throughout the                            procedure, the patient's blood pressure, pulse, and                            oxygen saturations were  monitored continuously. The                            Olympus Scope [id number] was introduced through the                            mouth, and advanced to the second part of duodenum.                            The upper GI endoscopy was accomplished without                            difficulty. The patient tolerated the procedure                            well. Scope In: Scope Out: Findings:                 Esophagogastric landmarks were identified: the                            Z-line was found at 38 cm, the gastroesophageal                            junction was found at 38 cm and the upper extent of                            the gastric folds was found at 39 cm from the                            incisors.                           A small hiatal hernia (sliding 1-2 cm in size) was                            present.                           The exam of the esophagus was otherwise normal.  Patchy inflammation characterized by striped                            erythema was found in the proximal gastric body.                            Biopsies were taken with a cold forceps for                            Helicobacter pylori testing.                           The exam of the stomach was otherwise normal.                           The examined duodenum was normal. Complications:            No immediate complications. Estimated blood loss:                            Minimal. Estimated Blood Loss:     Estimated blood loss was minimal. Impression:               - Esophagogastric landmarks identified.                           - Small hiatal hernia.                           - Normal esophagus otherwise.                           - Gastritis. Biopsied.                           - Normal stomach otherwise.                           - Normal examined duodenum. Recommendation:           - Patient has a contact number available for                             emergencies. The signs and symptoms of potential                            delayed complications were discussed with the                            patient. Return to normal activities tomorrow.                            Written discharge instructions were provided to the                            patient.                           -  Resume previous diet.                           - Continue present medications.                           - Increase nexium to twice daily dosing for a few                            week trial                           - Avoid NSAIDS                           - Await pathology results.                           - Consideration for MRCP pending course Willaim Rayas. Adela Lank, MD 11/25/2023 8:48:59 AM This report has been signed electronically.

## 2023-11-26 ENCOUNTER — Telehealth: Payer: Self-pay

## 2023-11-26 NOTE — Telephone Encounter (Signed)
  Follow up Call-     11/25/2023    7:39 AM  Call back number  Post procedure Call Back phone  # [phone]  Permission to leave phone message Yes     Patient questions:  Do you have a fever, pain , or abdominal swelling? No. Pain Score  0 *  Have you tolerated food without any problems? Yes.    Have you been able to return to your normal activities? Yes.    Do you have any questions about your discharge instructions: Diet   No. Medications  No. Follow up visit  No.  Do you have questions or concerns about your Care? No.  Actions: * If pain score is 4 or above: No action needed, pain <4.

## 2023-11-27 LAB — SURGICAL PATHOLOGY

## 2023-12-14 DIAGNOSIS — G4700 Insomnia, unspecified: Secondary | ICD-10-CM | POA: Diagnosis not present

## 2023-12-14 DIAGNOSIS — F319 Bipolar disorder, unspecified: Secondary | ICD-10-CM | POA: Diagnosis not present

## 2023-12-14 DIAGNOSIS — F410 Panic disorder [episodic paroxysmal anxiety] without agoraphobia: Secondary | ICD-10-CM | POA: Diagnosis not present

## 2023-12-29 ENCOUNTER — Ambulatory Visit: Admitting: Physician Assistant

## 2024-01-06 ENCOUNTER — Other Ambulatory Visit: Payer: Self-pay | Admitting: Psychiatry

## 2024-01-06 DIAGNOSIS — E559 Vitamin D deficiency, unspecified: Secondary | ICD-10-CM

## 2024-01-14 DIAGNOSIS — E7800 Pure hypercholesterolemia, unspecified: Secondary | ICD-10-CM | POA: Diagnosis not present

## 2024-01-14 DIAGNOSIS — E559 Vitamin D deficiency, unspecified: Secondary | ICD-10-CM | POA: Diagnosis not present

## 2024-01-14 DIAGNOSIS — Z0000 Encounter for general adult medical examination without abnormal findings: Secondary | ICD-10-CM | POA: Diagnosis not present

## 2024-01-14 DIAGNOSIS — G720 Drug-induced myopathy: Secondary | ICD-10-CM | POA: Diagnosis not present

## 2024-01-14 DIAGNOSIS — F419 Anxiety disorder, unspecified: Secondary | ICD-10-CM | POA: Diagnosis not present

## 2024-01-14 DIAGNOSIS — F3181 Bipolar II disorder: Secondary | ICD-10-CM | POA: Diagnosis not present

## 2024-01-14 DIAGNOSIS — R748 Abnormal levels of other serum enzymes: Secondary | ICD-10-CM | POA: Diagnosis not present

## 2024-01-14 DIAGNOSIS — I10 Essential (primary) hypertension: Secondary | ICD-10-CM | POA: Diagnosis not present

## 2024-01-14 DIAGNOSIS — E291 Testicular hypofunction: Secondary | ICD-10-CM | POA: Diagnosis not present

## 2024-01-26 DIAGNOSIS — G4700 Insomnia, unspecified: Secondary | ICD-10-CM | POA: Diagnosis not present

## 2024-01-26 DIAGNOSIS — F319 Bipolar disorder, unspecified: Secondary | ICD-10-CM | POA: Diagnosis not present

## 2024-01-26 DIAGNOSIS — F410 Panic disorder [episodic paroxysmal anxiety] without agoraphobia: Secondary | ICD-10-CM | POA: Diagnosis not present

## 2024-01-26 DIAGNOSIS — F4011 Social phobia, generalized: Secondary | ICD-10-CM | POA: Diagnosis not present

## 2024-03-08 DIAGNOSIS — G4700 Insomnia, unspecified: Secondary | ICD-10-CM | POA: Diagnosis not present

## 2024-03-08 DIAGNOSIS — F319 Bipolar disorder, unspecified: Secondary | ICD-10-CM | POA: Diagnosis not present

## 2024-03-08 DIAGNOSIS — F410 Panic disorder [episodic paroxysmal anxiety] without agoraphobia: Secondary | ICD-10-CM | POA: Diagnosis not present

## 2024-03-18 ENCOUNTER — Other Ambulatory Visit: Payer: Self-pay

## 2024-03-18 ENCOUNTER — Emergency Department (HOSPITAL_COMMUNITY)
Admission: EM | Admit: 2024-03-18 | Discharge: 2024-03-18 | Disposition: A | Discharge status: Home / Self Care | Attending: Emergency Medicine | Admitting: Emergency Medicine

## 2024-03-18 ENCOUNTER — Encounter (HOSPITAL_COMMUNITY): Payer: Self-pay | Admitting: Emergency Medicine

## 2024-03-18 DIAGNOSIS — R61 Generalized hyperhidrosis: Secondary | ICD-10-CM | POA: Diagnosis not present

## 2024-03-18 DIAGNOSIS — I10 Essential (primary) hypertension: Secondary | ICD-10-CM | POA: Insufficient documentation

## 2024-03-18 DIAGNOSIS — T679XXA Effect of heat and light, unspecified, initial encounter: Secondary | ICD-10-CM | POA: Diagnosis not present

## 2024-03-18 DIAGNOSIS — R55 Syncope and collapse: Secondary | ICD-10-CM | POA: Insufficient documentation

## 2024-03-18 DIAGNOSIS — J45909 Unspecified asthma, uncomplicated: Secondary | ICD-10-CM | POA: Diagnosis not present

## 2024-03-18 DIAGNOSIS — Z79899 Other long term (current) drug therapy: Secondary | ICD-10-CM | POA: Insufficient documentation

## 2024-03-18 DIAGNOSIS — D72829 Elevated white blood cell count, unspecified: Secondary | ICD-10-CM | POA: Diagnosis not present

## 2024-03-18 DIAGNOSIS — T671XXA Heat syncope, initial encounter: Secondary | ICD-10-CM

## 2024-03-18 LAB — CBC WITH DIFFERENTIAL/PLATELET
Abs Immature Granulocytes: 0.07 K/uL (ref 0.00–0.07)
Basophils Absolute: 0.1 K/uL (ref 0.0–0.1)
Basophils Relative: 0 %
Eosinophils Absolute: 0.1 K/uL (ref 0.0–0.5)
Eosinophils Relative: 1 %
HCT: 34.2 % — ABNORMAL LOW (ref 39.0–52.0)
Hemoglobin: 11.5 g/dL — ABNORMAL LOW (ref 13.0–17.0)
Immature Granulocytes: 1 %
Lymphocytes Relative: 13 %
Lymphs Abs: 1.5 K/uL (ref 0.7–4.0)
MCH: 31.1 pg (ref 26.0–34.0)
MCHC: 33.6 g/dL (ref 30.0–36.0)
MCV: 92.4 fL (ref 80.0–100.0)
Monocytes Absolute: 0.5 K/uL (ref 0.1–1.0)
Monocytes Relative: 4 %
Neutro Abs: 9.6 K/uL — ABNORMAL HIGH (ref 1.7–7.7)
Neutrophils Relative %: 81 %
Platelets: 156 K/uL (ref 150–400)
RBC: 3.7 MIL/uL — ABNORMAL LOW (ref 4.22–5.81)
RDW: 12 % (ref 11.5–15.5)
WBC: 11.8 K/uL — ABNORMAL HIGH (ref 4.0–10.5)
nRBC: 0 % (ref 0.0–0.2)

## 2024-03-18 LAB — COMPREHENSIVE METABOLIC PANEL WITH GFR
ALT: 17 U/L (ref 0–44)
AST: 17 U/L (ref 15–41)
Albumin: 3.8 g/dL (ref 3.5–5.0)
Alkaline Phosphatase: 52 U/L (ref 38–126)
Anion gap: 7 (ref 5–15)
BUN: 12 mg/dL (ref 8–23)
CO2: 24 mmol/L (ref 22–32)
Calcium: 8.2 mg/dL — ABNORMAL LOW (ref 8.9–10.3)
Chloride: 102 mmol/L (ref 98–111)
Creatinine, Ser: 0.75 mg/dL (ref 0.61–1.24)
GFR, Estimated: >60 mL/min (ref >60)
Glucose, Bld: 97 mg/dL (ref 70–99)
Potassium: 3.4 mmol/L — ABNORMAL LOW (ref 3.5–5.1)
Sodium: 133 mmol/L — ABNORMAL LOW (ref 135–145)
Total Bilirubin: 0.7 mg/dL (ref 0.0–1.2)
Total Protein: 6.5 g/dL (ref 6.5–8.1)

## 2024-03-18 LAB — D-DIMER, QUANTITATIVE: D-Dimer, Quant: 0.33 ug{FEU}/mL (ref 0.00–0.50)

## 2024-03-18 LAB — TROPONIN I (HIGH SENSITIVITY)
Troponin I (High Sensitivity): 4 ng/L (ref <18)
Troponin I (High Sensitivity): 4 ng/L (ref <18)

## 2024-03-18 MED ORDER — DEXTROSE 50 % IV SOLN
INTRAVENOUS | Status: DC
Start: 2024-03-18 — End: 2024-03-18
  Filled 2024-03-18: qty 50

## 2024-03-18 MED ORDER — SODIUM CHLORIDE 0.9 % IV BOLUS
1000.0000 mL | Freq: Once | INTRAVENOUS | Status: AC
  Administered 2024-03-18: 1000 mL via INTRAVENOUS

## 2024-03-18 NOTE — ED Provider Notes (Signed)
 [place] EMERGENCY DEPARTMENT AT [facility] Provider Note   CSN: [id number] Arrival date & time: 03/18/24  1714     Patient presents with: Dizziness and Emesis   Chase Hunter. is a 63 y.o. male history of bipolar, asthma, hypertension, hyperlipidemia presents with complaints of syncope earlier today.  Patient states he was out in the sun all day working.  He started to feel lightheaded and lowered himself down to the ground.  He had no associated chest pain or shortness of breath.  He has no cardiac history or prior blood clots.  States he feels much better after receiving some fluids from EMS.    Dizziness Associated symptoms: vomiting   Emesis  Past Medical History:  Diagnosis Date   Allergy    Anemia    Asthma    Bipolar II disorder (HCC)    Depression    Gallstones    GERD (gastroesophageal reflux disease)    Hx of adenomatous colonic polyps    Hyperlipidemia    Hypertension    Pancreatitis    Seizures (HCC)    past hx 6 yrs ago x 1- coming off meds caused 1 seizure        Prior to Admission medications   Medication Sig Start Date End Date Taking? Authorizing Provider  albuterol  (VENTOLIN  HFA) 108 (90 Base) MCG/ACT inhaler Inhale 2 puffs into the lungs every 6 (six) hours as needed.    [provider]  amLODipine  (NORVASC ) 5 MG tablet Take 1 tablet (5 mg total) by mouth daily. 07/12/22   Tex Drilling, NP  fluticasone  (FLONASE ) 50 MCG/ACT nasal spray Place 1 spray into both nostrils daily as needed.    [provider]  gabapentin  (NEURONTIN ) 300 MG capsule Take 1 capsule (300 mg total) by mouth 3 (three) times daily. 09/12/23 11/25/23  Franchot Harlene SQUIBB, PMHNP  hydrOXYzine  (ATARAX ) 25 MG tablet Take 1 tablet (25 mg total) by mouth 3 (three) times daily as needed for anxiety. 08/10/23   Franchot Harlene SQUIBB, PMHNP  lisinopril  (ZESTRIL ) 40 MG tablet Take 1 tablet (40 mg total) by mouth daily. 07/11/22   Tex Drilling, NP  LORazepam   (ATIVAN ) 0.5 MG tablet Take 0.25-0.5 mg by mouth daily as needed. 10/25/23   [provider]  LORazepam  (ATIVAN ) 1 MG tablet Take 0.5-1 mg by mouth daily as needed. 11/08/23   [provider]  QUEtiapine  (SEROQUEL  XR) 400 MG 24 hr tablet Take 1 tablet (400 mg total) by mouth every evening. 08/10/23   Franchot Harlene SQUIBB, PMHNP  QUEtiapine  (SEROQUEL ) 100 MG tablet Take 1/2-1 tablet twice daily 08/10/23   Franchot Harlene SQUIBB, PMHNP  Vitamin D , Ergocalciferol , (DRISDOL ) 1.25 MG (50000 UNIT) CAPS capsule TAKE 1 CAPSULE BY MOUTH EVERY 7 DAYS 01/07/24   Cottle, Lorene KANDICE Mickey., MD    Allergies: Cephalexin, Depakote [divalproex sodium], Trazodone and nefazodone, Lamictal [lamotrigine], and Lipitor [atorvastatin ]    Review of Systems  Gastrointestinal:  Positive for vomiting.  Neurological:  Positive for dizziness.    Updated Vital Signs BP (!) 124/92   Pulse (!) 56   Temp (!) 97.4 F (36.3 C) (Oral)   Resp 16   SpO2 97%   Physical Exam Vitals and nursing note reviewed.  Constitutional:      General: He is not in acute distress.    Appearance: He is well-developed.  HENT:     Head: Normocephalic and atraumatic.  Eyes:     Conjunctiva/sclera: Conjunctivae normal.  Cardiovascular:  Rate and Rhythm: Normal rate and regular rhythm.     Heart sounds: No murmur heard. Pulmonary:     Effort: Pulmonary effort is normal. No respiratory distress.     Breath sounds: Normal breath sounds.  Abdominal:     Palpations: Abdomen is soft.     Tenderness: There is no abdominal tenderness.  Musculoskeletal:        General: No swelling.     Cervical back: Neck supple.  Skin:    General: Skin is warm and dry.     Capillary Refill: Capillary refill takes less than 2 seconds.  Neurological:     Mental Status: He is alert.     Comments: Patient is alert and oriented. There is no abnormal phonation. Symmetric smile without facial droop. Moves all extremities spontaneously. 5/5 strength in upper  and lower extremities. . No sensation deficit. There is no nystagmus. EOMI, PERRL. Coordination intact with finger to nose and normal ambulation.    Psychiatric:        Mood and Affect: Mood normal.      (all labs ordered are listed, but only abnormal results are displayed) Labs Reviewed  CBC WITH DIFFERENTIAL/PLATELET - Abnormal; Notable for the following components:      Result Value   WBC 11.8 (*)    RBC 3.70 (*)    Hemoglobin 11.5 (*)    HCT 34.2 (*)    Neutro Abs 9.6 (*)    All other components within normal limits  COMPREHENSIVE METABOLIC PANEL WITH GFR - Abnormal; Notable for the following components:   Sodium 133 (*)    Potassium 3.4 (*)    Calcium  8.2 (*)    All other components within normal limits  D-DIMER, QUANTITATIVE  TROPONIN I (HIGH SENSITIVITY)  TROPONIN I (HIGH SENSITIVITY)    EKG: None  Radiology: No results found.   Procedures   Medications Ordered in the ED  sodium chloride  0.9 % bolus 1,000 mL (0 mLs Intravenous Stopped 03/18/24 2015)                                    Medical Decision Making Amount and/or Complexity of Data Reviewed Labs: ordered.   This patient presents to the ED with chief complaint(s) of syncope.  The complaint involves an extensive differential diagnosis and also carries with it a high risk of complications and morbidity.   Pertinent past medical history as listed in HPI  The differential diagnosis includes  Cardiac arrhythmia, vasovagal, dehydration, seizure, CVA, TIA Additional history obtained: Records reviewed Care Everywhere/External Records  Assessment and management:   Hemodynamically stable, nontoxic-appearing patient presenting with complaints of syncope episode earlier today.  Patient had spent the whole day working outside in the heat.  He had no associated chest pain or shortness of breath.  No unilateral weakness or numbness.  No sudden severe headache.  He has no neurodeficits on exam.  No cardiac  history.  Workup is overall reassuring.  Patient given liter bolus.  He states he feels totally fine and comfortable going home now.  Patient discharged home PCP follow-up.  Independent ECG interpretation:  Sinus rhythm  Independent labs interpretation:  The following labs were independently interpreted:  CBC with leukocytosis of 11.8, dimer within normal limits, CMP without significant abnormality, troponin without elevation  Independent visualization and interpretation of imaging: I independently visualized the following imaging with scope of interpretation limited to determining acute  life threatening conditions related to emergency care: none    Consultations obtained:   none  Disposition:   Patient will be discharged home. The patient has been appropriately medically screened and/or stabilized in the ED. I have low suspicion for any other emergent medical condition which would require further screening, evaluation or treatment in the ED or require inpatient management. At time of discharge the patient is hemodynamically stable and in no acute distress. I have discussed work-up results and diagnosis with patient and answered all questions. Patient is agreeable with discharge plan. We discussed strict return precautions for returning to the emergency department and they verbalized understanding.     Social Determinants of Health:   none  This note was dictated with voice recognition software.  Despite best efforts at proofreading, errors may have occurred which can change the documentation meaning.       Final diagnoses:  Syncope, unspecified syncope type    ED Discharge Orders     None          Donnajean Lynwood VEAR DEVONNA 03/18/24 2118    Randol Simmonds, MD 03/19/24 [phone]

## 2024-03-18 NOTE — Discharge Instructions (Addendum)
 You were evaluated in the emergency room after passing out.  Your lab work did not show any significant abnormality.  Please follow with your primary care doctor.  If you experience any worsening symptoms please return to emergency room.

## 2024-03-18 NOTE — ED Notes (Signed)
 Patient report loss of consciousness earlier, but says he feels better now.

## 2024-03-18 NOTE — ED Triage Notes (Signed)
 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 He went inside and ran his head under cool water, laid down and put cool towels on. He saw no improvement. Patient vomited after drinking Gatorade. EMS administered 4 mg of Zofran and 500 ml of fluid.   EMS vitals: 130/93 BP 68 HR 96% SPO2 on room air 114 CBG 97.7 Temp

## 2024-03-28 ENCOUNTER — Other Ambulatory Visit: Payer: Self-pay | Admitting: Psychiatry

## 2024-03-28 DIAGNOSIS — E559 Vitamin D deficiency, unspecified: Secondary | ICD-10-CM

## 2024-03-28 NOTE — Telephone Encounter (Signed)
 Needs to contact her current provider

## 2024-04-11 DIAGNOSIS — H2513 Age-related nuclear cataract, bilateral: Secondary | ICD-10-CM | POA: Diagnosis not present

## 2024-04-11 DIAGNOSIS — H33302 Unspecified retinal break, left eye: Secondary | ICD-10-CM | POA: Diagnosis not present

## 2024-04-12 ENCOUNTER — Encounter (INDEPENDENT_AMBULATORY_CARE_PROVIDER_SITE_OTHER): Admitting: Ophthalmology

## 2024-04-12 DIAGNOSIS — I10 Essential (primary) hypertension: Secondary | ICD-10-CM

## 2024-04-12 DIAGNOSIS — H43813 Vitreous degeneration, bilateral: Secondary | ICD-10-CM | POA: Diagnosis not present

## 2024-04-12 DIAGNOSIS — H35033 Hypertensive retinopathy, bilateral: Secondary | ICD-10-CM

## 2024-04-12 DIAGNOSIS — D3131 Benign neoplasm of right choroid: Secondary | ICD-10-CM

## 2024-04-18 DIAGNOSIS — F319 Bipolar disorder, unspecified: Secondary | ICD-10-CM | POA: Diagnosis not present

## 2024-04-18 DIAGNOSIS — F410 Panic disorder [episodic paroxysmal anxiety] without agoraphobia: Secondary | ICD-10-CM | POA: Diagnosis not present

## 2024-04-18 DIAGNOSIS — G4700 Insomnia, unspecified: Secondary | ICD-10-CM | POA: Diagnosis not present

## 2024-05-19 ENCOUNTER — Telehealth: Payer: Self-pay | Admitting: Physician Assistant

## 2024-05-19 NOTE — Telephone Encounter (Signed)
 FYI I called pt from recall and he said he was no longer taking Repatha  and does not need to come back.

## 2024-05-19 NOTE — Telephone Encounter (Signed)
 Attempted to contact patient. Left message to call back on personal voicemail.

## 2024-05-25 NOTE — Telephone Encounter (Signed)
 There is no recall for this patient.   Routed to pharmacy team, who started [id number]

## 2024-05-30 DIAGNOSIS — F319 Bipolar disorder, unspecified: Secondary | ICD-10-CM | POA: Diagnosis not present

## 2024-05-30 DIAGNOSIS — F410 Panic disorder [episodic paroxysmal anxiety] without agoraphobia: Secondary | ICD-10-CM | POA: Diagnosis not present

## 2024-05-30 DIAGNOSIS — F4011 Social phobia, generalized: Secondary | ICD-10-CM | POA: Diagnosis not present

## 2024-07-11 DIAGNOSIS — F4011 Social phobia, generalized: Secondary | ICD-10-CM | POA: Diagnosis not present

## 2024-07-11 DIAGNOSIS — F410 Panic disorder [episodic paroxysmal anxiety] without agoraphobia: Secondary | ICD-10-CM | POA: Diagnosis not present

## 2024-07-11 DIAGNOSIS — F319 Bipolar disorder, unspecified: Secondary | ICD-10-CM | POA: Diagnosis not present

## 2024-10-11 ENCOUNTER — Encounter (INDEPENDENT_AMBULATORY_CARE_PROVIDER_SITE_OTHER): Admitting: Ophthalmology
